# Patient Record
Sex: Male | Born: 1985 | Race: White | Hispanic: No | Marital: Single | Smoking: Current some day smoker
Health system: Southern US, Community
[De-identification: ages and names within clinical notes are randomized; demographics above are authoritative.]

## PROBLEM LIST (undated history)

## (undated) DIAGNOSIS — A419 Sepsis, unspecified organism: Secondary | ICD-10-CM

## (undated) DIAGNOSIS — K3184 Gastroparesis: Secondary | ICD-10-CM

## (undated) DIAGNOSIS — G8929 Other chronic pain: Secondary | ICD-10-CM

## (undated) DIAGNOSIS — I10 Essential (primary) hypertension: Secondary | ICD-10-CM

## (undated) DIAGNOSIS — D649 Anemia, unspecified: Secondary | ICD-10-CM

## (undated) DIAGNOSIS — Z22322 Carrier or suspected carrier of Methicillin resistant Staphylococcus aureus: Secondary | ICD-10-CM

## (undated) DIAGNOSIS — N289 Disorder of kidney and ureter, unspecified: Secondary | ICD-10-CM

## (undated) DIAGNOSIS — J690 Pneumonitis due to inhalation of food and vomit: Secondary | ICD-10-CM

## (undated) DIAGNOSIS — F1120 Opioid dependence, uncomplicated: Secondary | ICD-10-CM

## (undated) DIAGNOSIS — R569 Unspecified convulsions: Secondary | ICD-10-CM

## (undated) DIAGNOSIS — G9340 Encephalopathy, unspecified: Secondary | ICD-10-CM

## (undated) DIAGNOSIS — F419 Anxiety disorder, unspecified: Secondary | ICD-10-CM

## (undated) DIAGNOSIS — Z765 Malingerer [conscious simulation]: Secondary | ICD-10-CM

## (undated) DIAGNOSIS — Z8719 Personal history of other diseases of the digestive system: Secondary | ICD-10-CM

## (undated) DIAGNOSIS — E119 Type 2 diabetes mellitus without complications: Secondary | ICD-10-CM

## (undated) HISTORY — PX: ORTHOPEDIC SURGERY: SHX850

---

## 2004-07-14 ENCOUNTER — Other Ambulatory Visit: Payer: Self-pay

## 2004-07-14 ENCOUNTER — Emergency Department: Payer: Self-pay | Admitting: Internal Medicine

## 2005-05-07 ENCOUNTER — Emergency Department: Payer: Self-pay | Admitting: Emergency Medicine

## 2005-06-03 ENCOUNTER — Inpatient Hospital Stay: Payer: Self-pay | Admitting: Internal Medicine

## 2005-06-03 ENCOUNTER — Other Ambulatory Visit: Payer: Self-pay

## 2005-09-19 ENCOUNTER — Ambulatory Visit: Payer: Self-pay | Admitting: Internal Medicine

## 2005-10-14 ENCOUNTER — Emergency Department: Payer: Self-pay | Admitting: Emergency Medicine

## 2005-10-17 ENCOUNTER — Ambulatory Visit: Payer: Self-pay | Admitting: Internal Medicine

## 2005-12-19 ENCOUNTER — Emergency Department: Payer: Self-pay | Admitting: Unknown Physician Specialty

## 2006-03-26 ENCOUNTER — Ambulatory Visit: Payer: Self-pay | Admitting: Gastroenterology

## 2006-04-09 ENCOUNTER — Ambulatory Visit: Payer: Self-pay | Admitting: Gastroenterology

## 2006-11-16 ENCOUNTER — Emergency Department: Payer: Self-pay | Admitting: Emergency Medicine

## 2007-08-12 ENCOUNTER — Emergency Department: Payer: Self-pay | Admitting: Emergency Medicine

## 2007-08-23 ENCOUNTER — Other Ambulatory Visit: Payer: Self-pay

## 2007-08-23 ENCOUNTER — Emergency Department: Payer: Self-pay | Admitting: Emergency Medicine

## 2007-11-04 ENCOUNTER — Ambulatory Visit: Payer: Self-pay | Admitting: Family Medicine

## 2008-07-13 ENCOUNTER — Emergency Department: Payer: Self-pay | Admitting: Emergency Medicine

## 2009-05-14 ENCOUNTER — Inpatient Hospital Stay: Payer: Self-pay | Admitting: Internal Medicine

## 2009-05-18 ENCOUNTER — Emergency Department: Payer: Self-pay | Admitting: Emergency Medicine

## 2009-06-06 ENCOUNTER — Ambulatory Visit: Payer: Self-pay | Admitting: Gastroenterology

## 2009-06-21 ENCOUNTER — Emergency Department: Payer: Self-pay | Admitting: Emergency Medicine

## 2009-07-17 ENCOUNTER — Emergency Department: Payer: Self-pay | Admitting: Emergency Medicine

## 2010-02-09 ENCOUNTER — Emergency Department: Payer: Self-pay | Admitting: Emergency Medicine

## 2010-03-28 ENCOUNTER — Inpatient Hospital Stay: Payer: Self-pay | Admitting: *Deleted

## 2010-05-15 ENCOUNTER — Observation Stay: Payer: Self-pay | Admitting: Internal Medicine

## 2010-09-26 ENCOUNTER — Ambulatory Visit: Payer: Self-pay | Admitting: Gastroenterology

## 2011-01-07 ENCOUNTER — Emergency Department: Payer: Self-pay | Admitting: Emergency Medicine

## 2011-04-17 ENCOUNTER — Ambulatory Visit: Payer: Self-pay | Admitting: Urology

## 2011-04-25 ENCOUNTER — Ambulatory Visit: Payer: Self-pay | Admitting: Urology

## 2011-05-09 ENCOUNTER — Ambulatory Visit: Payer: Self-pay | Admitting: Urology

## 2011-05-13 ENCOUNTER — Ambulatory Visit: Payer: Self-pay | Admitting: Urology

## 2011-05-16 ENCOUNTER — Ambulatory Visit: Payer: Self-pay | Admitting: Urology

## 2011-05-22 ENCOUNTER — Ambulatory Visit: Payer: Self-pay | Admitting: Urology

## 2011-05-23 ENCOUNTER — Ambulatory Visit: Payer: Self-pay | Admitting: Urology

## 2011-05-27 ENCOUNTER — Emergency Department: Payer: Self-pay | Admitting: Emergency Medicine

## 2011-06-06 ENCOUNTER — Ambulatory Visit: Payer: Self-pay | Admitting: Urology

## 2011-06-18 ENCOUNTER — Ambulatory Visit: Payer: Self-pay | Admitting: Urology

## 2011-06-27 ENCOUNTER — Ambulatory Visit: Payer: Self-pay | Admitting: Urology

## 2011-07-11 ENCOUNTER — Ambulatory Visit: Payer: Self-pay | Admitting: Urology

## 2011-07-25 ENCOUNTER — Ambulatory Visit: Payer: Self-pay | Admitting: Urology

## 2011-08-15 ENCOUNTER — Ambulatory Visit: Payer: Self-pay | Admitting: Urology

## 2011-08-29 ENCOUNTER — Ambulatory Visit: Payer: Self-pay | Admitting: Urology

## 2011-09-11 ENCOUNTER — Ambulatory Visit: Payer: Self-pay | Admitting: Urology

## 2011-09-25 ENCOUNTER — Ambulatory Visit: Payer: Self-pay | Admitting: Urology

## 2011-10-03 ENCOUNTER — Ambulatory Visit: Payer: Self-pay | Admitting: Urology

## 2011-10-17 ENCOUNTER — Ambulatory Visit: Payer: Self-pay | Admitting: Urology

## 2011-10-28 ENCOUNTER — Inpatient Hospital Stay: Payer: Self-pay | Admitting: Internal Medicine

## 2011-10-28 LAB — COMPREHENSIVE METABOLIC PANEL
BUN: 34 mg/dL — ABNORMAL HIGH (ref 7–18)
Bilirubin,Total: 0.6 mg/dL (ref 0.2–1.0)
Chloride: 94 mmol/L — ABNORMAL LOW (ref 98–107)
Co2: 31 mmol/L (ref 21–32)
Creatinine: 1.03 mg/dL (ref 0.60–1.30)
EGFR (African American): >60
Osmolality: 286 (ref 275–301)
Potassium: 4.8 mmol/L (ref 3.5–5.1)

## 2011-10-28 LAB — CBC
HCT: 40.7 % (ref 40.0–52.0)
MCHC: 32.4 g/dL (ref 32.0–36.0)
RDW: 16.5 % — ABNORMAL HIGH (ref 11.5–14.5)

## 2011-10-28 LAB — URINALYSIS, COMPLETE
Bilirubin,UR: NEGATIVE
Glucose,UR: 50 mg/dL (ref 0–75)
Nitrite: NEGATIVE
Protein: >=500
Specific Gravity: 1.02 (ref 1.003–1.030)

## 2011-10-29 LAB — BUN: BUN: 22 mg/dL — ABNORMAL HIGH (ref 7–18)

## 2011-10-29 LAB — CBC WITH DIFFERENTIAL/PLATELET
Basophil #: 0 10*3/uL (ref 0.0–0.1)
Eosinophil #: 0 10*3/uL (ref 0.0–0.7)
HGB: 12.3 g/dL — ABNORMAL LOW (ref 13.0–18.0)
Lymphocyte #: 1.8 10*3/uL (ref 1.0–3.6)
Lymphocyte %: 11.6 %
MCH: 29.8 pg (ref 26.0–34.0)
MCHC: 32.5 g/dL (ref 32.0–36.0)
Neutrophil #: 13.1 10*3/uL — ABNORMAL HIGH (ref 1.4–6.5)
Neutrophil %: 82.9 %
Platelet: 427 10*3/uL (ref 150–440)
RBC: 4.13 10*6/uL — ABNORMAL LOW (ref 4.40–5.90)
WBC: 15.8 10*3/uL — ABNORMAL HIGH (ref 3.8–10.6)

## 2011-10-29 LAB — HEMOGLOBIN A1C: Hemoglobin A1C: 8.4 % — ABNORMAL HIGH (ref 4.2–6.3)

## 2011-10-31 ENCOUNTER — Ambulatory Visit: Payer: Self-pay | Admitting: Urology

## 2011-11-03 LAB — URINE CULTURE

## 2011-11-05 ENCOUNTER — Ambulatory Visit: Payer: Self-pay | Admitting: Unknown Physician Specialty

## 2012-03-31 IMAGING — CR DG ABDOMEN 1V
1 series · 1 of 1 positions shown · non-contrast
Comparison: none

REASON FOR EXAM: kidney stones
COMMENTS:

[view not recorded]
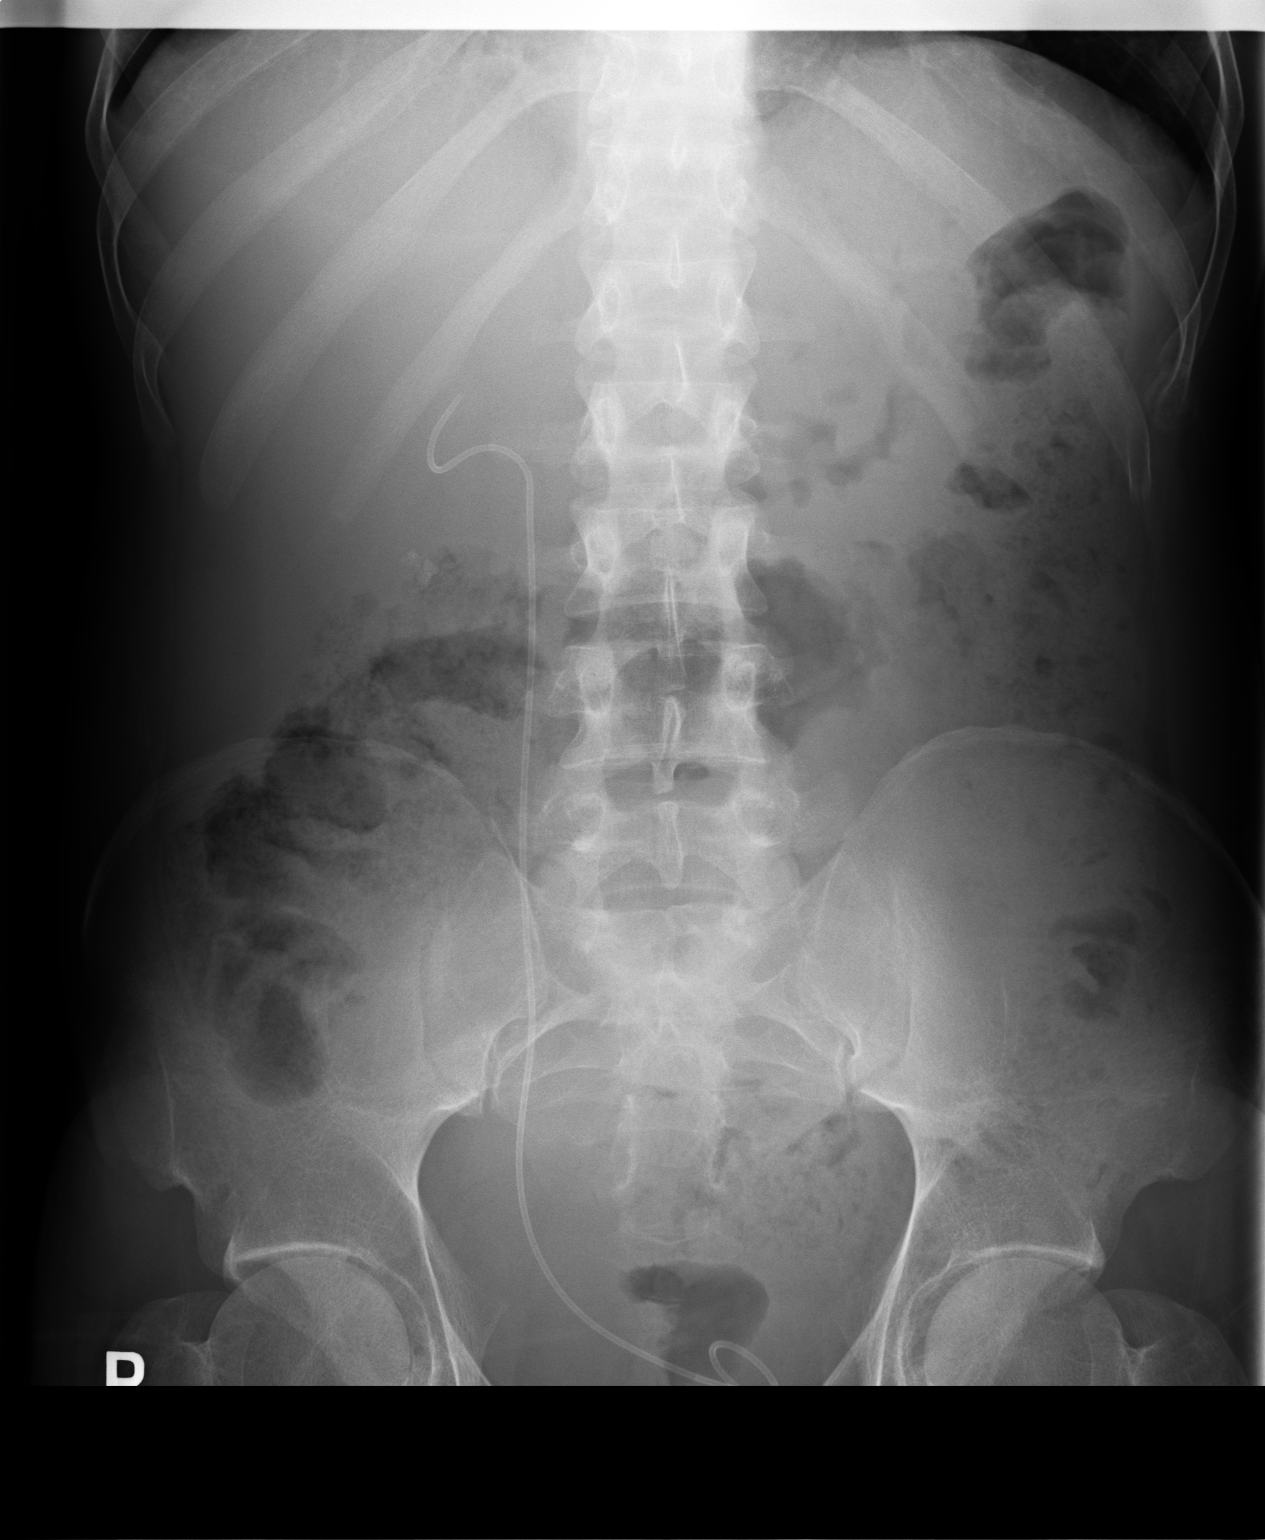

[1 of 1 positions shown; findings below may reference images not displayed]

PROCEDURE:     MDR - MDR KIDNEY URETER BLADDER  - June 18, 2011  [DATE]

RESULT:     An AP view of the abdomen is compared to a prior exam of
06/06/2011. A right ureteral stent is again observed. Calcifications are
visualized at the lower pole of the right kidney. The previously noted faint
calcifications along the upper course of the right ureteral stent are less
evident but there are noted 1 or 2 faint densities that may represent
residual stones. The findings now seen also could be artifactual. No left
renal stones or left ureteral stones are seen.
IMPRESSION: 1. Right nephrolithiasis.
2. There are faint equivocal densities projected along the course of the
right ureteral stent. The possibility of faint residual stones in the right
ureter cannot be entirely excluded but the findings are not definite.
3. No left renal or left ureteral stones are seen.

## 2012-05-08 ENCOUNTER — Ambulatory Visit: Payer: Self-pay | Admitting: Urology

## 2012-10-17 LAB — CBC WITH DIFFERENTIAL/PLATELET
Basophil %: 1.3 %
Eosinophil #: 0.3 10*3/uL (ref 0.0–0.7)
Lymphocyte %: 9.8 %
MCV: 89 fL (ref 80–100)
Monocyte #: 1.2 x10 3/mm — ABNORMAL HIGH (ref 0.2–1.0)
Monocyte %: 5.3 %
Neutrophil #: 17.9 10*3/uL — ABNORMAL HIGH (ref 1.4–6.5)
Neutrophil %: 82.4 %
RBC: 4.63 10*6/uL (ref 4.40–5.90)
WBC: 21.7 10*3/uL — ABNORMAL HIGH (ref 3.8–10.6)

## 2012-10-17 LAB — COMPREHENSIVE METABOLIC PANEL
Albumin: 3.5 g/dL (ref 3.4–5.0)
BUN: 57 mg/dL — ABNORMAL HIGH (ref 7–18)
Chloride: 90 mmol/L — ABNORMAL LOW (ref 98–107)
Co2: 31 mmol/L (ref 21–32)
Glucose: 285 mg/dL — ABNORMAL HIGH (ref 65–99)
SGOT(AST): 17 U/L (ref 15–37)
SGPT (ALT): 20 U/L (ref 12–78)
Sodium: 132 mmol/L — ABNORMAL LOW (ref 136–145)
Total Protein: 8.2 g/dL (ref 6.4–8.2)

## 2012-10-18 ENCOUNTER — Inpatient Hospital Stay: Payer: Self-pay | Admitting: Internal Medicine

## 2012-10-18 ENCOUNTER — Ambulatory Visit: Payer: Self-pay | Admitting: Neurology

## 2012-10-18 LAB — CBC WITH DIFFERENTIAL/PLATELET
Basophil %: 0.3 %
Eosinophil #: 0.1 10*3/uL (ref 0.0–0.7)
Eosinophil %: 0.7 %
HCT: 36.7 % — ABNORMAL LOW (ref 40.0–52.0)
HGB: 11.5 g/dL — ABNORMAL LOW (ref 13.0–18.0)
Lymphocyte %: 11.3 %
MCHC: 31.2 g/dL — ABNORMAL LOW (ref 32.0–36.0)
MCV: 89 fL (ref 80–100)
Monocyte #: 1.1 x10 3/mm — ABNORMAL HIGH (ref 0.2–1.0)
Neutrophil #: 11.9 10*3/uL — ABNORMAL HIGH (ref 1.4–6.5)
Neutrophil %: 80.2 %
Platelet: 298 10*3/uL (ref 150–440)
WBC: 14.9 10*3/uL — ABNORMAL HIGH (ref 3.8–10.6)

## 2012-10-18 LAB — DRUG SCREEN, URINE
Barbiturates, Ur Screen: NEGATIVE (ref ?–200)
Cannabinoid 50 Ng, Ur ~~LOC~~: POSITIVE (ref ?–50)
MDMA (Ecstasy)Ur Screen: NEGATIVE (ref ?–500)
Phencyclidine (PCP) Ur S: NEGATIVE (ref ?–25)

## 2012-10-18 LAB — PROTIME-INR
INR: 1
Prothrombin Time: 13.2 secs (ref 11.5–14.7)

## 2012-10-18 LAB — OSMOLALITY, URINE: Osmolality: 469 mOsm/kg

## 2012-10-18 LAB — URINALYSIS, COMPLETE
Bacteria: NONE SEEN
Bilirubin,UR: NEGATIVE
Granular Cast: 13
Hyaline Cast: 29
Leukocyte Esterase: NEGATIVE
Nitrite: NEGATIVE
Ph: 5 (ref 4.5–8.0)
Specific Gravity: 1.016 (ref 1.003–1.030)
WBC UR: 7 /HPF (ref 0–5)

## 2012-10-18 LAB — BASIC METABOLIC PANEL
Anion Gap: 8 (ref 7–16)
Calcium, Total: 8.1 mg/dL — ABNORMAL LOW (ref 8.5–10.1)
Chloride: 99 mmol/L (ref 98–107)
Co2: 30 mmol/L (ref 21–32)
Creatinine: 1.81 mg/dL — ABNORMAL HIGH (ref 0.60–1.30)
EGFR (African American): 58 — ABNORMAL LOW
Osmolality: 292 (ref 275–301)
Sodium: 137 mmol/L (ref 136–145)

## 2012-10-18 LAB — CK: CK, Total: 96 U/L (ref 35–232)

## 2012-10-18 LAB — APTT: Activated PTT: 37 secs — ABNORMAL HIGH (ref 23.6–35.9)

## 2012-10-18 LAB — CREATININE, URINE, RANDOM: Creatinine, Urine Random: 102.4 mg/dL (ref 30.0–125.0)

## 2012-10-18 LAB — SODIUM, URINE, RANDOM: Sodium, Urine Random: 50 mmol/L (ref 20–110)

## 2012-10-19 LAB — CBC WITH DIFFERENTIAL/PLATELET
Basophil #: 0.1 10*3/uL (ref 0.0–0.1)
Basophil %: 0.7 %
Eosinophil #: 0.1 10*3/uL (ref 0.0–0.7)
HCT: 36.5 % — ABNORMAL LOW (ref 40.0–52.0)
Lymphocyte #: 2.4 10*3/uL (ref 1.0–3.6)
Lymphocyte %: 15 %
MCV: 90 fL (ref 80–100)
Neutrophil #: 12.3 10*3/uL — ABNORMAL HIGH (ref 1.4–6.5)
Neutrophil %: 77.6 %
RDW: 16.4 % — ABNORMAL HIGH (ref 11.5–14.5)
WBC: 15.8 10*3/uL — ABNORMAL HIGH (ref 3.8–10.6)

## 2012-10-19 LAB — LIPID PANEL
Cholesterol: 153 mg/dL (ref 0–200)
HDL Cholesterol: 35 mg/dL — ABNORMAL LOW (ref 40–60)
Ldl Cholesterol, Calc: 46 mg/dL (ref 0–100)
Triglycerides: 358 mg/dL — ABNORMAL HIGH (ref 0–200)

## 2012-10-19 LAB — PROTEIN / CREATININE RATIO, URINE: Protein, Random Urine: 182 mg/dL — ABNORMAL HIGH (ref 0–12)

## 2012-10-19 LAB — COMPREHENSIVE METABOLIC PANEL
Alkaline Phosphatase: 115 U/L (ref 50–136)
BUN: 16 mg/dL (ref 7–18)
Calcium, Total: 8.3 mg/dL — ABNORMAL LOW (ref 8.5–10.1)
Creatinine: 0.84 mg/dL (ref 0.60–1.30)
Osmolality: 284 (ref 275–301)
SGOT(AST): 15 U/L (ref 15–37)
SGPT (ALT): 15 U/L (ref 12–78)

## 2012-10-19 LAB — CLOSTRIDIUM DIFFICILE BY PCR

## 2012-12-10 ENCOUNTER — Inpatient Hospital Stay: Payer: Self-pay | Admitting: Internal Medicine

## 2012-12-10 LAB — CBC WITH DIFFERENTIAL/PLATELET
Basophil %: 0.4 %
Eosinophil %: 1.8 %
HGB: 14 g/dL (ref 13.0–18.0)
Lymphocyte #: 4.1 10*3/uL — ABNORMAL HIGH (ref 1.0–3.6)
Lymphocyte %: 27.5 %
MCHC: 32.4 g/dL (ref 32.0–36.0)
MCV: 88 fL (ref 80–100)
Monocyte #: 1.3 x10 3/mm — ABNORMAL HIGH (ref 0.2–1.0)
Monocyte %: 8.8 %
RBC: 4.92 10*6/uL (ref 4.40–5.90)
RDW: 16.2 % — ABNORMAL HIGH (ref 11.5–14.5)
WBC: 15 10*3/uL — ABNORMAL HIGH (ref 3.8–10.6)

## 2012-12-10 LAB — BASIC METABOLIC PANEL
Chloride: 93 mmol/L — ABNORMAL LOW (ref 98–107)
Creatinine: 4.92 mg/dL — ABNORMAL HIGH (ref 0.60–1.30)
EGFR (Non-African Amer.): 15 — ABNORMAL LOW
Osmolality: 293 (ref 275–301)
Potassium: 3.8 mmol/L (ref 3.5–5.1)
Sodium: 131 mmol/L — ABNORMAL LOW (ref 136–145)

## 2012-12-10 LAB — URINALYSIS, COMPLETE
Bilirubin,UR: NEGATIVE
Glucose,UR: >=500 mg/dL (ref 0–75)
Ph: 6 (ref 4.5–8.0)
Protein: 100
RBC,UR: 24 /HPF (ref 0–5)
Specific Gravity: 1.007 (ref 1.003–1.030)
WBC UR: 834 /HPF (ref 0–5)

## 2012-12-10 LAB — COMPREHENSIVE METABOLIC PANEL
Alkaline Phosphatase: 134 U/L (ref 50–136)
Anion Gap: 10 (ref 7–16)
BUN: 92 mg/dL — ABNORMAL HIGH (ref 7–18)
Bilirubin,Total: 0.3 mg/dL (ref 0.2–1.0)
Chloride: 83 mmol/L — ABNORMAL LOW (ref 98–107)
Co2: 35 mmol/L — ABNORMAL HIGH (ref 21–32)
Creatinine: 5.71 mg/dL — ABNORMAL HIGH (ref 0.60–1.30)
EGFR (African American): 14 — ABNORMAL LOW
EGFR (Non-African Amer.): 12 — ABNORMAL LOW
Osmolality: 295 (ref 275–301)
Potassium: 3.9 mmol/L (ref 3.5–5.1)
SGOT(AST): 14 U/L — ABNORMAL LOW (ref 15–37)
SGPT (ALT): 12 U/L (ref 12–78)

## 2012-12-10 LAB — DRUG SCREEN, URINE
Amphetamines, Ur Screen: NEGATIVE (ref ?–1000)
Benzodiazepine, Ur Scrn: POSITIVE (ref ?–200)
Cannabinoid 50 Ng, Ur ~~LOC~~: POSITIVE (ref ?–50)
MDMA (Ecstasy)Ur Screen: NEGATIVE (ref ?–500)
Methadone, Ur Screen: NEGATIVE (ref ?–300)
Opiate, Ur Screen: POSITIVE (ref ?–300)
Phencyclidine (PCP) Ur S: NEGATIVE (ref ?–25)

## 2012-12-11 LAB — URINALYSIS, COMPLETE
Blood: NEGATIVE
Ketone: NEGATIVE
Leukocyte Esterase: NEGATIVE
Nitrite: NEGATIVE
Ph: 5 (ref 4.5–8.0)
RBC,UR: <1 /HPF (ref 0–5)
Specific Gravity: 1.008 (ref 1.003–1.030)

## 2012-12-11 LAB — CBC WITH DIFFERENTIAL/PLATELET
Basophil #: 0.1 10*3/uL (ref 0.0–0.1)
Basophil %: 0.5 %
Eosinophil #: 0.3 10*3/uL (ref 0.0–0.7)
HCT: 34.9 % — ABNORMAL LOW (ref 40.0–52.0)
Lymphocyte %: 19.2 %
MCHC: 31.9 g/dL — ABNORMAL LOW (ref 32.0–36.0)
MCV: 88 fL (ref 80–100)
Neutrophil #: 7.4 10*3/uL — ABNORMAL HIGH (ref 1.4–6.5)
Platelet: 285 10*3/uL (ref 150–440)
RBC: 3.95 10*6/uL — ABNORMAL LOW (ref 4.40–5.90)
RDW: 16 % — ABNORMAL HIGH (ref 11.5–14.5)

## 2012-12-11 LAB — COMPREHENSIVE METABOLIC PANEL
Albumin: 2.5 g/dL — ABNORMAL LOW (ref 3.4–5.0)
BUN: 71 mg/dL — ABNORMAL HIGH (ref 7–18)
Creatinine: 3.57 mg/dL — ABNORMAL HIGH (ref 0.60–1.30)
EGFR (African American): 26 — ABNORMAL LOW
EGFR (Non-African Amer.): 22 — ABNORMAL LOW
Glucose: 477 mg/dL — ABNORMAL HIGH (ref 65–99)
Osmolality: 305 (ref 275–301)
Potassium: 4.1 mmol/L (ref 3.5–5.1)
SGPT (ALT): 12 U/L (ref 12–78)
Sodium: 131 mmol/L — ABNORMAL LOW (ref 136–145)
Total Protein: 6.4 g/dL (ref 6.4–8.2)

## 2012-12-11 LAB — PROTEIN / CREATININE RATIO, URINE
Creatinine, Urine: 44.9 mg/dL (ref 30.0–125.0)
Protein/Creat. Ratio: 2472 mg/gCREAT — ABNORMAL HIGH (ref 0–200)

## 2012-12-12 LAB — BASIC METABOLIC PANEL
Anion Gap: 9 (ref 7–16)
BUN: 45 mg/dL — ABNORMAL HIGH (ref 7–18)
Co2: 24 mmol/L (ref 21–32)
Creatinine: 1.99 mg/dL — ABNORMAL HIGH (ref 0.60–1.30)
EGFR (African American): 52 — ABNORMAL LOW
EGFR (Non-African Amer.): 45 — ABNORMAL LOW
Glucose: 485 mg/dL — ABNORMAL HIGH (ref 65–99)
Osmolality: 294 (ref 275–301)
Sodium: 130 mmol/L — ABNORMAL LOW (ref 136–145)

## 2012-12-12 LAB — CBC WITH DIFFERENTIAL/PLATELET
Basophil %: 0.7 %
Eosinophil #: 0.1 10*3/uL (ref 0.0–0.7)
Eosinophil %: 1.1 %
HGB: 11.9 g/dL — ABNORMAL LOW (ref 13.0–18.0)
Lymphocyte #: 2 10*3/uL (ref 1.0–3.6)
MCHC: 31.8 g/dL — ABNORMAL LOW (ref 32.0–36.0)
Monocyte #: 0.7 x10 3/mm (ref 0.2–1.0)
Monocyte %: 5.2 %
Neutrophil #: 9.7 10*3/uL — ABNORMAL HIGH (ref 1.4–6.5)
Neutrophil %: 77.2 %
RDW: 15.6 % — ABNORMAL HIGH (ref 11.5–14.5)

## 2012-12-12 LAB — DRUG SCREEN, URINE
Amphetamines, Ur Screen: NEGATIVE (ref ?–1000)
Barbiturates, Ur Screen: NEGATIVE (ref ?–200)
Benzodiazepine, Ur Scrn: POSITIVE (ref ?–200)
Cannabinoid 50 Ng, Ur ~~LOC~~: NEGATIVE (ref ?–50)
Methadone, Ur Screen: NEGATIVE (ref ?–300)
Opiate, Ur Screen: NEGATIVE (ref ?–300)
Tricyclic, Ur Screen: NEGATIVE (ref ?–1000)

## 2012-12-14 LAB — STOOL CULTURE

## 2013-05-13 ENCOUNTER — Encounter: Payer: Self-pay | Admitting: Orthopedic Surgery

## 2013-05-17 ENCOUNTER — Encounter: Payer: Self-pay | Admitting: Orthopedic Surgery

## 2013-09-21 ENCOUNTER — Inpatient Hospital Stay: Payer: Self-pay | Admitting: Internal Medicine

## 2013-09-21 LAB — CBC
HCT: 46.2 % (ref 40.0–52.0)
HGB: 12.8 g/dL — ABNORMAL LOW (ref 13.0–18.0)
MCH: 27.7 pg (ref 26.0–34.0)
MCHC: 27.6 g/dL — ABNORMAL LOW (ref 32.0–36.0)
MCV: 100 fL (ref 80–100)
PLATELETS: 491 10*3/uL — AB (ref 150–440)
RBC: 4.6 10*6/uL (ref 4.40–5.90)
RDW: 17.6 % — ABNORMAL HIGH (ref 11.5–14.5)
WBC: 20.3 10*3/uL — ABNORMAL HIGH (ref 3.8–10.6)

## 2013-09-21 LAB — BASIC METABOLIC PANEL
ANION GAP: 24 — AB (ref 7–16)
ANION GAP: 15 (ref 7–16)
BUN: 67 mg/dL — ABNORMAL HIGH (ref 7–18)
BUN: 66 mg/dL — ABNORMAL HIGH (ref 7–18)
CHLORIDE: 92 mmol/L — AB (ref 98–107)
CHLORIDE: 96 mmol/L — AB (ref 98–107)
CO2: 10 mmol/L — AB (ref 21–32)
Calcium, Total: 7.2 mg/dL — ABNORMAL LOW (ref 8.5–10.1)
Calcium, Total: 7.3 mg/dL — ABNORMAL LOW (ref 8.5–10.1)
Co2: 14 mmol/L — ABNORMAL LOW (ref 21–32)
Creatinine: 2.05 mg/dL — ABNORMAL HIGH (ref 0.60–1.30)
Creatinine: 2.06 mg/dL — ABNORMAL HIGH (ref 0.60–1.30)
EGFR (African American): 50 — ABNORMAL LOW
EGFR (African American): 49 — ABNORMAL LOW
EGFR (Non-African Amer.): 43 — ABNORMAL LOW
EGFR (Non-African Amer.): 43 — ABNORMAL LOW
GLUCOSE: 810 mg/dL — AB (ref 65–99)
Glucose: 946 mg/dL (ref 65–99)
OSMOLALITY: 320 (ref 275–301)
Osmolality: 310 (ref 275–301)
POTASSIUM: 4 mmol/L (ref 3.5–5.1)
Potassium: 3.6 mmol/L (ref 3.5–5.1)
SODIUM: 126 mmol/L — AB (ref 136–145)
SODIUM: 125 mmol/L — AB (ref 136–145)

## 2013-09-21 LAB — COMPREHENSIVE METABOLIC PANEL
ALK PHOS: 244 U/L — AB
AST: 10 U/L — AB (ref 15–37)
Albumin: 2.4 g/dL — ABNORMAL LOW (ref 3.4–5.0)
Anion Gap: 33 — ABNORMAL HIGH (ref 7–16)
BUN: 70 mg/dL — ABNORMAL HIGH (ref 7–18)
Bilirubin,Total: 0.7 mg/dL (ref 0.2–1.0)
CALCIUM: 8.1 mg/dL — AB (ref 8.5–10.1)
CO2: 7 mmol/L — AB (ref 21–32)
Chloride: 75 mmol/L — ABNORMAL LOW (ref 98–107)
Creatinine: 1.96 mg/dL — ABNORMAL HIGH (ref 0.60–1.30)
GFR CALC AF AMER: 52 — AB
GFR CALC NON AF AMER: 45 — AB
GLUCOSE: 1196 mg/dL — AB (ref 65–99)
OSMOLALITY: 314 (ref 275–301)
Potassium: 5.3 mmol/L — ABNORMAL HIGH (ref 3.5–5.1)
SGPT (ALT): 24 U/L (ref 12–78)
SODIUM: 115 mmol/L — AB (ref 136–145)
Total Protein: 6.8 g/dL (ref 6.4–8.2)

## 2013-09-21 LAB — RAPID INFLUENZA A&B ANTIGENS (ARMC ONLY)

## 2013-09-21 LAB — RAPID HIV-1/2 QL/CONFIRM: HIV-1/2,Rapid Ql: NEGATIVE

## 2013-09-22 LAB — BASIC METABOLIC PANEL
ANION GAP: 8 (ref 7–16)
Anion Gap: 10 (ref 7–16)
Anion Gap: 9 (ref 7–16)
BUN: 64 mg/dL — ABNORMAL HIGH (ref 7–18)
BUN: 56 mg/dL — ABNORMAL HIGH (ref 7–18)
BUN: 53 mg/dL — ABNORMAL HIGH (ref 7–18)
CALCIUM: 8.6 mg/dL (ref 8.5–10.1)
CHLORIDE: 103 mmol/L (ref 98–107)
CREATININE: 1.76 mg/dL — AB (ref 0.60–1.30)
Calcium, Total: 7.6 mg/dL — ABNORMAL LOW (ref 8.5–10.1)
Calcium, Total: 8.1 mg/dL — ABNORMAL LOW (ref 8.5–10.1)
Chloride: 110 mmol/L — ABNORMAL HIGH (ref 98–107)
Chloride: 106 mmol/L (ref 98–107)
Co2: 19 mmol/L — ABNORMAL LOW (ref 21–32)
Co2: 20 mmol/L — ABNORMAL LOW (ref 21–32)
Co2: 20 mmol/L — ABNORMAL LOW (ref 21–32)
Creatinine: 1.99 mg/dL — ABNORMAL HIGH (ref 0.60–1.30)
Creatinine: 1.82 mg/dL — ABNORMAL HIGH (ref 0.60–1.30)
EGFR (African American): 51 — ABNORMAL LOW
EGFR (African American): 57 — ABNORMAL LOW
EGFR (African American): 60 — ABNORMAL LOW
EGFR (Non-African Amer.): 44 — ABNORMAL LOW
EGFR (Non-African Amer.): 49 — ABNORMAL LOW
EGFR (Non-African Amer.): 51 — ABNORMAL LOW
Glucose: 548 mg/dL (ref 65–99)
Glucose: 106 mg/dL — ABNORMAL HIGH (ref 65–99)
Glucose: 90 mg/dL (ref 65–99)
OSMOLALITY: 293 (ref 275–301)
OSMOLALITY: 282 (ref 275–301)
Osmolality: 308 (ref 275–301)
POTASSIUM: 3.2 mmol/L — AB (ref 3.5–5.1)
POTASSIUM: 3.2 mmol/L — AB (ref 3.5–5.1)
Potassium: 3.1 mmol/L — ABNORMAL LOW (ref 3.5–5.1)
Sodium: 132 mmol/L — ABNORMAL LOW (ref 136–145)
Sodium: 139 mmol/L (ref 136–145)
Sodium: 134 mmol/L — ABNORMAL LOW (ref 136–145)

## 2013-09-22 LAB — COMPREHENSIVE METABOLIC PANEL
ALBUMIN: 1.7 g/dL — AB (ref 3.4–5.0)
ALK PHOS: 161 U/L — AB
Anion Gap: 9 (ref 7–16)
BUN: 60 mg/dL — ABNORMAL HIGH (ref 7–18)
Bilirubin,Total: 0.4 mg/dL (ref 0.2–1.0)
CHLORIDE: 108 mmol/L — AB (ref 98–107)
CREATININE: 2.06 mg/dL — AB (ref 0.60–1.30)
Calcium, Total: 8.1 mg/dL — ABNORMAL LOW (ref 8.5–10.1)
Co2: 20 mmol/L — ABNORMAL LOW (ref 21–32)
EGFR (African American): 49 — ABNORMAL LOW
EGFR (Non-African Amer.): 43 — ABNORMAL LOW
GLUCOSE: 258 mg/dL — AB (ref 65–99)
OSMOLALITY: 300 (ref 275–301)
POTASSIUM: 3 mmol/L — AB (ref 3.5–5.1)
SGOT(AST): 10 U/L — ABNORMAL LOW (ref 15–37)
SGPT (ALT): 17 U/L (ref 12–78)
Sodium: 137 mmol/L (ref 136–145)
TOTAL PROTEIN: 4.9 g/dL — AB (ref 6.4–8.2)

## 2013-09-22 LAB — CBC WITH DIFFERENTIAL/PLATELET
Basophil #: 0 10*3/uL (ref 0.0–0.1)
Basophil %: 0.2 %
EOS ABS: 0 10*3/uL (ref 0.0–0.7)
Eosinophil %: 0.2 %
HCT: 32.5 % — AB (ref 40.0–52.0)
HGB: 10.9 g/dL — AB (ref 13.0–18.0)
LYMPHS ABS: 1.2 10*3/uL (ref 1.0–3.6)
LYMPHS PCT: 18.7 %
MCH: 28.4 pg (ref 26.0–34.0)
MCHC: 33.6 g/dL (ref 32.0–36.0)
MCV: 85 fL (ref 80–100)
Monocyte #: 0.3 x10 3/mm (ref 0.2–1.0)
Monocyte %: 4.1 %
Neutrophil #: 4.9 10*3/uL (ref 1.4–6.5)
Neutrophil %: 76.8 %
PLATELETS: 372 10*3/uL (ref 150–440)
RBC: 3.85 10*6/uL — ABNORMAL LOW (ref 4.40–5.90)
RDW: 15.9 % — AB (ref 11.5–14.5)
WBC: 6.4 10*3/uL (ref 3.8–10.6)

## 2013-09-22 LAB — DRUG SCREEN, URINE
Amphetamines, Ur Screen: NEGATIVE (ref ?–1000)
BENZODIAZEPINE, UR SCRN: POSITIVE (ref ?–200)
Barbiturates, Ur Screen: NEGATIVE (ref ?–200)
Cannabinoid 50 Ng, Ur ~~LOC~~: POSITIVE (ref ?–50)
Cocaine Metabolite,Ur ~~LOC~~: NEGATIVE (ref ?–300)
MDMA (ECSTASY) UR SCREEN: NEGATIVE (ref ?–500)
METHADONE, UR SCREEN: NEGATIVE (ref ?–300)
Opiate, Ur Screen: NEGATIVE (ref ?–300)
PHENCYCLIDINE (PCP) UR S: NEGATIVE (ref ?–25)
Tricyclic, Ur Screen: NEGATIVE (ref ?–1000)

## 2013-09-22 LAB — HEMOGLOBIN: HGB: 11 g/dL — AB (ref 13.0–18.0)

## 2013-09-22 LAB — MAGNESIUM
MAGNESIUM: 1.5 mg/dL — AB
MAGNESIUM: 1.6 mg/dL — AB

## 2013-09-23 LAB — BASIC METABOLIC PANEL
ANION GAP: 7 (ref 7–16)
BUN: 42 mg/dL — ABNORMAL HIGH (ref 7–18)
CHLORIDE: 110 mmol/L — AB (ref 98–107)
Calcium, Total: 7.6 mg/dL — ABNORMAL LOW (ref 8.5–10.1)
Co2: 22 mmol/L (ref 21–32)
Creatinine: 1.69 mg/dL — ABNORMAL HIGH (ref 0.60–1.30)
EGFR (Non-African Amer.): 54 — ABNORMAL LOW
GLUCOSE: 170 mg/dL — AB (ref 65–99)
Osmolality: 292 (ref 275–301)
POTASSIUM: 3.7 mmol/L (ref 3.5–5.1)
SODIUM: 139 mmol/L (ref 136–145)

## 2013-09-23 LAB — URINE CULTURE

## 2013-09-23 LAB — CBC WITH DIFFERENTIAL/PLATELET
BASOS PCT: 0.1 %
Basophil #: 0 10*3/uL (ref 0.0–0.1)
Eosinophil #: 0.6 10*3/uL (ref 0.0–0.7)
Eosinophil %: 2.3 %
HCT: 29.6 % — ABNORMAL LOW (ref 40.0–52.0)
HGB: 9.9 g/dL — ABNORMAL LOW (ref 13.0–18.0)
Lymphocyte #: 2 10*3/uL (ref 1.0–3.6)
Lymphocyte %: 7.8 %
MCH: 28.4 pg (ref 26.0–34.0)
MCHC: 33.5 g/dL (ref 32.0–36.0)
MCV: 85 fL (ref 80–100)
MONO ABS: 0.4 x10 3/mm (ref 0.2–1.0)
Monocyte %: 1.5 %
Neutrophil #: 22.4 10*3/uL — ABNORMAL HIGH (ref 1.4–6.5)
Neutrophil %: 88.3 %
Platelet: 317 10*3/uL (ref 150–440)
RBC: 3.49 10*6/uL — AB (ref 4.40–5.90)
RDW: 15.9 % — AB (ref 11.5–14.5)
WBC: 25.4 10*3/uL — AB (ref 3.8–10.6)

## 2013-09-23 LAB — MAGNESIUM: MAGNESIUM: 1.7 mg/dL — AB

## 2013-09-24 LAB — BASIC METABOLIC PANEL
ANION GAP: 6 — AB (ref 7–16)
ANION GAP: 8 (ref 7–16)
BUN: 24 mg/dL — ABNORMAL HIGH (ref 7–18)
BUN: 22 mg/dL — ABNORMAL HIGH (ref 7–18)
CALCIUM: 7.6 mg/dL — AB (ref 8.5–10.1)
CALCIUM: 8.2 mg/dL — AB (ref 8.5–10.1)
Chloride: 118 mmol/L — ABNORMAL HIGH (ref 98–107)
Chloride: 115 mmol/L — ABNORMAL HIGH (ref 98–107)
Co2: 22 mmol/L (ref 21–32)
Co2: 22 mmol/L (ref 21–32)
Creatinine: 1.3 mg/dL (ref 0.60–1.30)
Creatinine: 1.17 mg/dL (ref 0.60–1.30)
EGFR (African American): >60
EGFR (African American): >60
EGFR (Non-African Amer.): >60
GLUCOSE: 111 mg/dL — AB (ref 65–99)
GLUCOSE: 108 mg/dL — AB (ref 65–99)
OSMOLALITY: 293 (ref 275–301)
Osmolality: 295 (ref 275–301)
Potassium: 3.3 mmol/L — ABNORMAL LOW (ref 3.5–5.1)
Potassium: 3.9 mmol/L (ref 3.5–5.1)
SODIUM: 146 mmol/L — AB (ref 136–145)
SODIUM: 145 mmol/L (ref 136–145)

## 2013-09-24 LAB — CBC WITH DIFFERENTIAL/PLATELET
Basophil #: 0.1 10*3/uL (ref 0.0–0.1)
Basophil %: 0.5 %
Eosinophil #: 0.9 10*3/uL — ABNORMAL HIGH (ref 0.0–0.7)
Eosinophil %: 3.5 %
HCT: 28.5 % — ABNORMAL LOW (ref 40.0–52.0)
HGB: 9.2 g/dL — ABNORMAL LOW (ref 13.0–18.0)
LYMPHS ABS: 2.5 10*3/uL (ref 1.0–3.6)
Lymphocyte %: 9.8 %
MCH: 27.7 pg (ref 26.0–34.0)
MCHC: 32.4 g/dL (ref 32.0–36.0)
MCV: 86 fL (ref 80–100)
MONO ABS: 0.5 x10 3/mm (ref 0.2–1.0)
Monocyte %: 2.1 %
NEUTROS ABS: 21.2 10*3/uL — AB (ref 1.4–6.5)
Neutrophil %: 84.1 %
Platelet: 315 10*3/uL (ref 150–440)
RBC: 3.33 10*6/uL — ABNORMAL LOW (ref 4.40–5.90)
RDW: 16.3 % — ABNORMAL HIGH (ref 11.5–14.5)
WBC: 25.2 10*3/uL — ABNORMAL HIGH (ref 3.8–10.6)

## 2013-09-24 LAB — VANCOMYCIN, TROUGH: VANCOMYCIN, TROUGH: 15 ug/mL (ref 10–20)

## 2013-09-24 LAB — TRIGLYCERIDES: TRIGLYCERIDES: 309 mg/dL — AB (ref 0–200)

## 2013-09-24 LAB — CULTURE, BLOOD (SINGLE)

## 2013-09-24 LAB — MAGNESIUM
MAGNESIUM: 1.9 mg/dL
MAGNESIUM: 1.5 mg/dL — AB

## 2013-09-24 LAB — PHOSPHORUS: Phosphorus: 2.4 mg/dL — ABNORMAL LOW (ref 2.5–4.9)

## 2013-09-24 LAB — EXPECTORATED SPUTUM ASSESSMENT W REFEX TO RESP CULTURE

## 2013-09-25 LAB — CBC WITH DIFFERENTIAL/PLATELET
Basophil #: 0.3 10*3/uL — ABNORMAL HIGH (ref 0.0–0.1)
Basophil %: 1.1 %
Eosinophil #: 1.1 10*3/uL — ABNORMAL HIGH (ref 0.0–0.7)
Eosinophil %: 4 %
HCT: 25.6 % — ABNORMAL LOW (ref 40.0–52.0)
HGB: 8.4 g/dL — AB (ref 13.0–18.0)
Lymphocyte #: 2.3 10*3/uL (ref 1.0–3.6)
Lymphocyte %: 8.8 %
MCH: 28.2 pg (ref 26.0–34.0)
MCHC: 32.7 g/dL (ref 32.0–36.0)
MCV: 86 fL (ref 80–100)
MONO ABS: 0.7 x10 3/mm (ref 0.2–1.0)
Monocyte %: 2.5 %
Neutrophil #: 22.3 10*3/uL — ABNORMAL HIGH (ref 1.4–6.5)
Neutrophil %: 83.6 %
Platelet: 287 10*3/uL (ref 150–440)
RBC: 2.97 10*6/uL — AB (ref 4.40–5.90)
RDW: 16.6 % — AB (ref 11.5–14.5)
WBC: 26.6 10*3/uL — AB (ref 3.8–10.6)

## 2013-09-25 LAB — BASIC METABOLIC PANEL
ANION GAP: 5 — AB (ref 7–16)
BUN: 23 mg/dL — AB (ref 7–18)
CHLORIDE: 116 mmol/L — AB (ref 98–107)
CREATININE: 1.21 mg/dL (ref 0.60–1.30)
Calcium, Total: 7.8 mg/dL — ABNORMAL LOW (ref 8.5–10.1)
Co2: 19 mmol/L — ABNORMAL LOW (ref 21–32)
Glucose: 210 mg/dL — ABNORMAL HIGH (ref 65–99)
Osmolality: 289 (ref 275–301)
POTASSIUM: 4.4 mmol/L (ref 3.5–5.1)
SODIUM: 140 mmol/L (ref 136–145)

## 2013-09-26 LAB — COMPREHENSIVE METABOLIC PANEL
Albumin: 1.2 g/dL — ABNORMAL LOW (ref 3.4–5.0)
Alkaline Phosphatase: 823 U/L — ABNORMAL HIGH
Anion Gap: 4 — ABNORMAL LOW (ref 7–16)
BUN: 21 mg/dL — ABNORMAL HIGH (ref 7–18)
Bilirubin,Total: 0.4 mg/dL (ref 0.2–1.0)
Calcium, Total: 8.2 mg/dL — ABNORMAL LOW (ref 8.5–10.1)
Chloride: 112 mmol/L — ABNORMAL HIGH (ref 98–107)
Co2: 27 mmol/L (ref 21–32)
Creatinine: 0.98 mg/dL (ref 0.60–1.30)
EGFR (African American): >60
EGFR (Non-African Amer.): >60
Glucose: 141 mg/dL — ABNORMAL HIGH (ref 65–99)
Osmolality: 290 (ref 275–301)
Potassium: 3.4 mmol/L — ABNORMAL LOW (ref 3.5–5.1)
SGOT(AST): 32 U/L (ref 15–37)
SGPT (ALT): 30 U/L (ref 12–78)
Sodium: 143 mmol/L (ref 136–145)
Total Protein: 5.3 g/dL — ABNORMAL LOW (ref 6.4–8.2)

## 2013-09-26 LAB — CBC WITH DIFFERENTIAL/PLATELET
Basophil #: 0.1 10*3/uL (ref 0.0–0.1)
Basophil %: 0.5 %
Eosinophil #: 1 10*3/uL — ABNORMAL HIGH (ref 0.0–0.7)
Eosinophil %: 6.5 %
HCT: 28.2 % — ABNORMAL LOW (ref 40.0–52.0)
HGB: 9 g/dL — ABNORMAL LOW (ref 13.0–18.0)
Lymphocyte #: 1.6 10*3/uL (ref 1.0–3.6)
Lymphocyte %: 10.7 %
MCH: 27.1 pg (ref 26.0–34.0)
MCHC: 31.8 g/dL — ABNORMAL LOW (ref 32.0–36.0)
MCV: 85 fL (ref 80–100)
Monocyte #: 0.5 x10 3/mm (ref 0.2–1.0)
Monocyte %: 3 %
Neutrophil #: 12.1 10*3/uL — ABNORMAL HIGH (ref 1.4–6.5)
Neutrophil %: 79.3 %
Platelet: 347 10*3/uL (ref 150–440)
RBC: 3.3 10*6/uL — ABNORMAL LOW (ref 4.40–5.90)
RDW: 16.4 % — ABNORMAL HIGH (ref 11.5–14.5)
WBC: 15.3 10*3/uL — ABNORMAL HIGH (ref 3.8–10.6)

## 2013-09-26 LAB — VANCOMYCIN, TROUGH
Vancomycin, Trough: 25 ug/mL (ref 10–20)
Vancomycin, Trough: 23 ug/mL (ref 10–20)

## 2013-09-27 LAB — CBC WITH DIFFERENTIAL/PLATELET
BASOS PCT: 0.8 %
Basophil #: 0.1 10*3/uL (ref 0.0–0.1)
Eosinophil #: 0.8 10*3/uL — ABNORMAL HIGH (ref 0.0–0.7)
Eosinophil %: 5.3 %
HCT: 24.4 % — ABNORMAL LOW (ref 40.0–52.0)
HGB: 8.2 g/dL — AB (ref 13.0–18.0)
LYMPHS ABS: 2.9 10*3/uL (ref 1.0–3.6)
LYMPHS PCT: 18.8 %
MCH: 28.7 pg (ref 26.0–34.0)
MCHC: 33.7 g/dL (ref 32.0–36.0)
MCV: 85 fL (ref 80–100)
Monocyte #: 1 x10 3/mm (ref 0.2–1.0)
Monocyte %: 6.6 %
NEUTROS PCT: 68.5 %
Neutrophil #: 10.7 10*3/uL — ABNORMAL HIGH (ref 1.4–6.5)
PLATELETS: 395 10*3/uL (ref 150–440)
RBC: 2.87 10*6/uL — AB (ref 4.40–5.90)
RDW: 15.8 % — ABNORMAL HIGH (ref 11.5–14.5)
WBC: 15.6 10*3/uL — ABNORMAL HIGH (ref 3.8–10.6)

## 2013-09-27 LAB — BASIC METABOLIC PANEL
Anion Gap: 7 (ref 7–16)
BUN: 16 mg/dL (ref 7–18)
Calcium, Total: 7.8 mg/dL — ABNORMAL LOW (ref 8.5–10.1)
Chloride: 110 mmol/L — ABNORMAL HIGH (ref 98–107)
Co2: 27 mmol/L (ref 21–32)
Creatinine: 0.92 mg/dL (ref 0.60–1.30)
EGFR (Non-African Amer.): >60
Glucose: 144 mg/dL — ABNORMAL HIGH (ref 65–99)
Osmolality: 291 (ref 275–301)
Potassium: 3.4 mmol/L — ABNORMAL LOW (ref 3.5–5.1)
Sodium: 144 mmol/L (ref 136–145)

## 2013-09-28 DIAGNOSIS — I369 Nonrheumatic tricuspid valve disorder, unspecified: Secondary | ICD-10-CM

## 2013-09-28 LAB — BASIC METABOLIC PANEL
Anion Gap: 5 — ABNORMAL LOW (ref 7–16)
BUN: 14 mg/dL (ref 7–18)
CALCIUM: 7.8 mg/dL — AB (ref 8.5–10.1)
CO2: 32 mmol/L (ref 21–32)
Chloride: 107 mmol/L (ref 98–107)
Creatinine: 0.89 mg/dL (ref 0.60–1.30)
EGFR (African American): >60
EGFR (Non-African Amer.): >60
Glucose: 80 mg/dL (ref 65–99)
Osmolality: 286 (ref 275–301)
Potassium: 3.2 mmol/L — ABNORMAL LOW (ref 3.5–5.1)
Sodium: 144 mmol/L (ref 136–145)

## 2013-09-28 LAB — MAGNESIUM: Magnesium: 1.5 mg/dL — ABNORMAL LOW

## 2013-09-28 LAB — CBC WITH DIFFERENTIAL/PLATELET
Basophil #: 0.1 10*3/uL (ref 0.0–0.1)
Basophil %: 0.5 %
Eosinophil #: 0.1 10*3/uL (ref 0.0–0.7)
Eosinophil %: 0.7 %
HCT: 28.1 % — AB (ref 40.0–52.0)
HGB: 9.2 g/dL — ABNORMAL LOW (ref 13.0–18.0)
LYMPHS PCT: 18.3 %
Lymphocyte #: 3.5 10*3/uL (ref 1.0–3.6)
MCH: 27.9 pg (ref 26.0–34.0)
MCHC: 32.9 g/dL (ref 32.0–36.0)
MCV: 85 fL (ref 80–100)
MONOS PCT: 6.9 %
Monocyte #: 1.3 x10 3/mm — ABNORMAL HIGH (ref 0.2–1.0)
NEUTROS ABS: 14 10*3/uL — AB (ref 1.4–6.5)
NEUTROS PCT: 73.6 %
Platelet: 476 10*3/uL — ABNORMAL HIGH (ref 150–440)
RBC: 3.31 10*6/uL — ABNORMAL LOW (ref 4.40–5.90)
RDW: 15.4 % — AB (ref 11.5–14.5)
WBC: 19 10*3/uL — AB (ref 3.8–10.6)

## 2013-09-28 LAB — PHOSPHORUS: Phosphorus: 4.1 mg/dL (ref 2.5–4.9)

## 2013-09-29 LAB — CBC WITH DIFFERENTIAL/PLATELET
BASOS ABS: 0.1 10*3/uL (ref 0.0–0.1)
BASOS PCT: 0.7 %
EOS ABS: 0 10*3/uL (ref 0.0–0.7)
EOS PCT: 0.2 %
HCT: 24.7 % — ABNORMAL LOW (ref 40.0–52.0)
HGB: 7.8 g/dL — ABNORMAL LOW (ref 13.0–18.0)
Lymphocyte #: 3.1 10*3/uL (ref 1.0–3.6)
Lymphocyte %: 16 %
MCH: 27.2 pg (ref 26.0–34.0)
MCHC: 31.4 g/dL — ABNORMAL LOW (ref 32.0–36.0)
MCV: 87 fL (ref 80–100)
Monocyte #: 2.2 x10 3/mm — ABNORMAL HIGH (ref 0.2–1.0)
Monocyte %: 11.2 %
Neutrophil #: 14.1 10*3/uL — ABNORMAL HIGH (ref 1.4–6.5)
Neutrophil %: 71.9 %
PLATELETS: 516 10*3/uL — AB (ref 150–440)
RBC: 2.86 10*6/uL — AB (ref 4.40–5.90)
RDW: 15 % — AB (ref 11.5–14.5)
WBC: 19.6 10*3/uL — AB (ref 3.8–10.6)

## 2013-09-29 LAB — BASIC METABOLIC PANEL
ANION GAP: 10 (ref 7–16)
BUN: 15 mg/dL (ref 7–18)
CHLORIDE: 104 mmol/L (ref 98–107)
CREATININE: 0.99 mg/dL (ref 0.60–1.30)
Calcium, Total: 8 mg/dL — ABNORMAL LOW (ref 8.5–10.1)
Co2: 25 mmol/L (ref 21–32)
Glucose: 215 mg/dL — ABNORMAL HIGH (ref 65–99)
Osmolality: 285 (ref 275–301)
Potassium: 3.4 mmol/L — ABNORMAL LOW (ref 3.5–5.1)
Sodium: 139 mmol/L (ref 136–145)

## 2013-09-29 LAB — VANCOMYCIN, TROUGH: VANCOMYCIN, TROUGH: 12 ug/mL (ref 10–20)

## 2013-09-29 LAB — CLOSTRIDIUM DIFFICILE(ARMC)

## 2013-09-30 LAB — CBC WITH DIFFERENTIAL/PLATELET
Basophil #: 0.1 10*3/uL (ref 0.0–0.1)
Basophil %: 0.7 %
Eosinophil #: 0.1 10*3/uL (ref 0.0–0.7)
Eosinophil %: 0.5 %
HCT: 24.8 % — AB (ref 40.0–52.0)
HGB: 7.9 g/dL — ABNORMAL LOW (ref 13.0–18.0)
LYMPHS ABS: 2 10*3/uL (ref 1.0–3.6)
Lymphocyte %: 10.6 %
MCH: 27.7 pg (ref 26.0–34.0)
MCHC: 32 g/dL (ref 32.0–36.0)
MCV: 87 fL (ref 80–100)
Monocyte #: 3 x10 3/mm — ABNORMAL HIGH (ref 0.2–1.0)
Monocyte %: 15.8 %
NEUTROS ABS: 13.6 10*3/uL — AB (ref 1.4–6.5)
Neutrophil %: 72.4 %
Platelet: 594 10*3/uL — ABNORMAL HIGH (ref 150–440)
RBC: 2.86 10*6/uL — ABNORMAL LOW (ref 4.40–5.90)
RDW: 14.4 % (ref 11.5–14.5)
WBC: 18.9 10*3/uL — AB (ref 3.8–10.6)

## 2013-09-30 LAB — BASIC METABOLIC PANEL
Anion Gap: 3 — ABNORMAL LOW (ref 7–16)
BUN: 10 mg/dL (ref 7–18)
CALCIUM: 8.2 mg/dL — AB (ref 8.5–10.1)
CO2: 30 mmol/L (ref 21–32)
Chloride: 105 mmol/L (ref 98–107)
Creatinine: 0.82 mg/dL (ref 0.60–1.30)
Glucose: 40 mg/dL — CL (ref 65–99)
OSMOLALITY: 271 (ref 275–301)
POTASSIUM: 3.1 mmol/L — AB (ref 3.5–5.1)
Sodium: 138 mmol/L (ref 136–145)

## 2013-10-01 LAB — CBC WITH DIFFERENTIAL/PLATELET
Basophil #: 0.1 10*3/uL (ref 0.0–0.1)
Basophil %: 0.7 %
EOS ABS: 0.1 10*3/uL (ref 0.0–0.7)
EOS PCT: 0.8 %
HCT: 22.8 % — AB (ref 40.0–52.0)
HGB: 7.4 g/dL — ABNORMAL LOW (ref 13.0–18.0)
LYMPHS ABS: 2.3 10*3/uL (ref 1.0–3.6)
Lymphocyte %: 13.3 %
MCH: 28 pg (ref 26.0–34.0)
MCHC: 32.5 g/dL (ref 32.0–36.0)
MCV: 86 fL (ref 80–100)
MONO ABS: 1.8 x10 3/mm — AB (ref 0.2–1.0)
Monocyte %: 10.4 %
Neutrophil #: 12.8 10*3/uL — ABNORMAL HIGH (ref 1.4–6.5)
Neutrophil %: 74.8 %
PLATELETS: 668 10*3/uL — AB (ref 150–440)
RBC: 2.64 10*6/uL — ABNORMAL LOW (ref 4.40–5.90)
RDW: 14.9 % — ABNORMAL HIGH (ref 11.5–14.5)
WBC: 17.1 10*3/uL — ABNORMAL HIGH (ref 3.8–10.6)

## 2013-10-01 LAB — BASIC METABOLIC PANEL
Anion Gap: 6 — ABNORMAL LOW (ref 7–16)
BUN: 11 mg/dL (ref 7–18)
CO2: 29 mmol/L (ref 21–32)
Calcium, Total: 7.8 mg/dL — ABNORMAL LOW (ref 8.5–10.1)
Chloride: 97 mmol/L — ABNORMAL LOW (ref 98–107)
Creatinine: 0.98 mg/dL (ref 0.60–1.30)
EGFR (African American): >60
EGFR (Non-African Amer.): >60
Glucose: 288 mg/dL — ABNORMAL HIGH (ref 65–99)
Osmolality: 274 (ref 275–301)
Potassium: 3.1 mmol/L — ABNORMAL LOW (ref 3.5–5.1)
Sodium: 132 mmol/L — ABNORMAL LOW (ref 136–145)

## 2013-10-01 LAB — VANCOMYCIN, TROUGH: VANCOMYCIN, TROUGH: 19 ug/mL (ref 10–20)

## 2013-10-01 LAB — IRON: IRON: 20 ug/dL — AB (ref 65–175)

## 2013-10-01 LAB — LACTATE DEHYDROGENASE: LDH: 226 U/L (ref 85–241)

## 2013-10-01 LAB — MAGNESIUM: Magnesium: 1.4 mg/dL — ABNORMAL LOW

## 2013-10-01 LAB — POTASSIUM: Potassium: 3.5 mmol/L (ref 3.5–5.1)

## 2013-10-01 LAB — OCCULT BLOOD X 1 CARD TO LAB, STOOL: Occult Blood, Feces: POSITIVE

## 2013-10-01 LAB — FERRITIN: Ferritin (ARMC): 194 ng/mL (ref 8–388)

## 2013-10-02 LAB — BASIC METABOLIC PANEL
Anion Gap: 1 — ABNORMAL LOW (ref 7–16)
BUN: 9 mg/dL (ref 7–18)
CREATININE: 0.79 mg/dL (ref 0.60–1.30)
Calcium, Total: 8 mg/dL — ABNORMAL LOW (ref 8.5–10.1)
Chloride: 104 mmol/L (ref 98–107)
Co2: 32 mmol/L (ref 21–32)
EGFR (African American): >60
Glucose: 100 mg/dL — ABNORMAL HIGH (ref 65–99)
OSMOLALITY: 273 (ref 275–301)
POTASSIUM: 3.1 mmol/L — AB (ref 3.5–5.1)
Sodium: 137 mmol/L (ref 136–145)

## 2013-10-02 LAB — CBC WITH DIFFERENTIAL/PLATELET
BASOS ABS: 0.2 10*3/uL — AB (ref 0.0–0.1)
BASOS PCT: 1.1 %
EOS PCT: 1.4 %
Eosinophil #: 0.3 10*3/uL (ref 0.0–0.7)
HCT: 26.6 % — AB (ref 40.0–52.0)
HGB: 8.6 g/dL — AB (ref 13.0–18.0)
Lymphocyte #: 2.1 10*3/uL (ref 1.0–3.6)
Lymphocyte %: 11.3 %
MCH: 27.6 pg (ref 26.0–34.0)
MCHC: 32.3 g/dL (ref 32.0–36.0)
MCV: 85 fL (ref 80–100)
MONOS PCT: 6.2 %
Monocyte #: 1.1 x10 3/mm — ABNORMAL HIGH (ref 0.2–1.0)
Neutrophil #: 14.6 10*3/uL — ABNORMAL HIGH (ref 1.4–6.5)
Neutrophil %: 80 %
Platelet: 768 10*3/uL — ABNORMAL HIGH (ref 150–440)
RBC: 3.12 10*6/uL — ABNORMAL LOW (ref 4.40–5.90)
RDW: 15.8 % — ABNORMAL HIGH (ref 11.5–14.5)
WBC: 18.3 10*3/uL — ABNORMAL HIGH (ref 3.8–10.6)

## 2013-10-02 LAB — CLOSTRIDIUM DIFFICILE(ARMC)

## 2013-10-02 LAB — MAGNESIUM: MAGNESIUM: 1.6 mg/dL — AB

## 2013-10-02 LAB — OCCULT BLOOD X 1 CARD TO LAB, STOOL: Occult Blood, Feces: POSITIVE

## 2013-10-02 LAB — VANCOMYCIN, TROUGH: VANCOMYCIN, TROUGH: 30 ug/mL — AB (ref 10–20)

## 2013-10-03 LAB — BASIC METABOLIC PANEL
Anion Gap: 4 — ABNORMAL LOW (ref 7–16)
BUN: 9 mg/dL (ref 7–18)
Calcium, Total: 8 mg/dL — ABNORMAL LOW (ref 8.5–10.1)
Chloride: 102 mmol/L (ref 98–107)
Co2: 31 mmol/L (ref 21–32)
Creatinine: 0.88 mg/dL (ref 0.60–1.30)
EGFR (African American): >60
Glucose: 150 mg/dL — ABNORMAL HIGH (ref 65–99)
Osmolality: 275 (ref 275–301)
POTASSIUM: 3.4 mmol/L — AB (ref 3.5–5.1)
SODIUM: 137 mmol/L (ref 136–145)

## 2013-10-03 LAB — CBC WITH DIFFERENTIAL/PLATELET
BASOS ABS: 0.3 10*3/uL — AB (ref 0.0–0.1)
Basophil %: 1.5 %
EOS PCT: 1.3 %
Eosinophil #: 0.2 10*3/uL (ref 0.0–0.7)
HCT: 25.6 % — AB (ref 40.0–52.0)
HGB: 8.3 g/dL — ABNORMAL LOW (ref 13.0–18.0)
LYMPHS ABS: 2 10*3/uL (ref 1.0–3.6)
Lymphocyte %: 12 %
MCH: 27.7 pg (ref 26.0–34.0)
MCHC: 32.4 g/dL (ref 32.0–36.0)
MCV: 85 fL (ref 80–100)
Monocyte #: 1 x10 3/mm (ref 0.2–1.0)
Monocyte %: 5.9 %
NEUTROS PCT: 79.3 %
Neutrophil #: 13.1 10*3/uL — ABNORMAL HIGH (ref 1.4–6.5)
PLATELETS: 834 10*3/uL — AB (ref 150–440)
RBC: 3 10*6/uL — ABNORMAL LOW (ref 4.40–5.90)
RDW: 16.1 % — ABNORMAL HIGH (ref 11.5–14.5)
WBC: 16.5 10*3/uL — AB (ref 3.8–10.6)

## 2013-10-03 LAB — CULTURE, BLOOD (SINGLE)

## 2013-10-04 LAB — BASIC METABOLIC PANEL
Anion Gap: 5 — ABNORMAL LOW (ref 7–16)
BUN: 8 mg/dL (ref 7–18)
CALCIUM: 8.3 mg/dL — AB (ref 8.5–10.1)
CHLORIDE: 100 mmol/L (ref 98–107)
CO2: 31 mmol/L (ref 21–32)
CREATININE: 0.74 mg/dL (ref 0.60–1.30)
EGFR (Non-African Amer.): >60
Glucose: 231 mg/dL — ABNORMAL HIGH (ref 65–99)
OSMOLALITY: 278 (ref 275–301)
Potassium: 3.6 mmol/L (ref 3.5–5.1)
SODIUM: 136 mmol/L (ref 136–145)

## 2013-10-04 LAB — STOOL CULTURE

## 2013-10-04 LAB — VANCOMYCIN, TROUGH: VANCOMYCIN, TROUGH: 16 ug/mL (ref 10–20)

## 2013-10-05 DIAGNOSIS — I330 Acute and subacute infective endocarditis: Secondary | ICD-10-CM

## 2013-10-05 LAB — BASIC METABOLIC PANEL
ANION GAP: 4 — AB (ref 7–16)
BUN: 10 mg/dL (ref 7–18)
CALCIUM: 8.5 mg/dL (ref 8.5–10.1)
CHLORIDE: 102 mmol/L (ref 98–107)
CO2: 29 mmol/L (ref 21–32)
Creatinine: 0.77 mg/dL (ref 0.60–1.30)
EGFR (Non-African Amer.): >60
GLUCOSE: 103 mg/dL — AB (ref 65–99)
Osmolality: 269 (ref 275–301)
POTASSIUM: 3.4 mmol/L — AB (ref 3.5–5.1)
Sodium: 135 mmol/L — ABNORMAL LOW (ref 136–145)

## 2013-10-05 LAB — WBC: WBC: 13.7 10*3/uL — ABNORMAL HIGH (ref 3.8–10.6)

## 2013-10-06 LAB — VANCOMYCIN, TROUGH: Vancomycin, Trough: 16 ug/mL

## 2013-10-07 ENCOUNTER — Inpatient Hospital Stay
Admission: RE | Admit: 2013-10-07 | Discharge: 2013-10-08 | Disposition: A | Discharge status: Nursing Home (Includes ICF) | Payer: Medicaid Other | Source: Ambulatory Visit | Attending: Internal Medicine | Admitting: Internal Medicine

## 2013-10-07 ENCOUNTER — Non-Acute Institutional Stay (SKILLED_NURSING_FACILITY): Payer: Medicaid Other | Admitting: Internal Medicine

## 2013-10-07 DIAGNOSIS — E1010 Type 1 diabetes mellitus with ketoacidosis without coma: Secondary | ICD-10-CM

## 2013-10-07 DIAGNOSIS — Z8701 Personal history of pneumonia (recurrent): Secondary | ICD-10-CM | POA: Insufficient documentation

## 2013-10-07 DIAGNOSIS — G9340 Encephalopathy, unspecified: Secondary | ICD-10-CM | POA: Insufficient documentation

## 2013-10-07 DIAGNOSIS — R7881 Bacteremia: Secondary | ICD-10-CM | POA: Insufficient documentation

## 2013-10-07 DIAGNOSIS — E109 Type 1 diabetes mellitus without complications: Secondary | ICD-10-CM | POA: Insufficient documentation

## 2013-10-07 DIAGNOSIS — N179 Acute kidney failure, unspecified: Secondary | ICD-10-CM

## 2013-10-07 DIAGNOSIS — A4902 Methicillin resistant Staphylococcus aureus infection, unspecified site: Secondary | ICD-10-CM

## 2013-10-07 DIAGNOSIS — D638 Anemia in other chronic diseases classified elsewhere: Secondary | ICD-10-CM

## 2013-10-07 DIAGNOSIS — R569 Unspecified convulsions: Secondary | ICD-10-CM | POA: Insufficient documentation

## 2013-10-07 LAB — GLUCOSE, CAPILLARY
Glucose-Capillary: 388 mg/dL — ABNORMAL HIGH (ref 70–99)
Glucose-Capillary: 118 mg/dL — ABNORMAL HIGH (ref 70–99)
Glucose-Capillary: 548 mg/dL — ABNORMAL HIGH (ref 70–99)
Glucose-Capillary: 458 mg/dL — ABNORMAL HIGH (ref 70–99)

## 2013-10-07 NOTE — Progress Notes (Signed)
Patient ID: Maurice Davidson, male   DOB: 04-12-1986, 28 y.o.   MRN: [id number]   This is an acute visit.  Level of care skilled.  Facility [facility]  Chief complaint-acute visit status post hospitalization for encephalopathy thought to be secondary to diabetic ketoacidosis-sepsis.  History of present illness.  Patient is a very pleasant 28 year old male who presented to the hospital with altered mental status-he was encephalopathic and was noted to be in DKA with a blood sugar of 1100.  He was intubated and treated with broad-spectrum antibiotics source of infection was thought to be MRSA pneumonia complicated with bacteremia-in continues on long-term vancomycin apparently a 4 week course he is here to complete treatment for this. He has completed a course of Zosyn as well for what was thought to be aspiration pneumonia.    He did have a TTE andTEE which did not reveal any vegetations.  His DKA resolved with insulin drip Patient did have history hypoglycemia and hyperglycemia post extubation-he was discharged on Levemir 10 units each bedtime with 5 units of Aspart prior to meals and apparently that worked reasonably well  The patient also apparently had 3 back-to-back seizures during his stay was monitored in the ICU and started on Keppra there were no further seizures-apparently he does not have a previous seizure history apparently there is some history of substance abuse.  Previous medical history. Toxic metabolic encephalopathy.  Diabetic ketoacidosis.  Type 1 diabetes.  Sepsis.  MRSA bacteremia and pneumonia  Seizures while in hospital.  Aspiration pneumonia.  Acute renal failure.  Acute on chronic kidney disease.  Electrolyte abnormalities hypokalemia -- magnesium-apparently resolved in repleted in hospital.  Chronic anemia.  History of GI bleed.  History depression  Past surgical history.  EGD and colonoscopy for gastroparesis.  Family history-grandparents  with history of diabetes-mother with history of osteoporosis.  Social history-patient lives with his parents smokes a pack and a half a day.  Has denied alcohol use-apparently some previous history of substance abuse.  Marland Kitchen  Hospital studies. 10/05/2013 Transesophageal echocardiogram-normal study left ventricular ejection fraction 55-60%-no vegetations noted.Marland Kitchen  09/30/2013.  Chest x-ray-increasing right middle lobe unlikely right upper lobe airspace disease-this is worsened for progressive pneumonia or aspiration  Medications.  Lisinopril  5 mg daily.  Paroxetine 80 mg a day.  Protonix 40 mg daily.  Keppra 500 mg twice a day.  Levimer 10 units subcutaneous each bedtime.  Aspart insulin 5 units 3 times a day before meals.  Glucernao shake 3 times a day with meals.  Ferrous sulfate 325 mg twice a day  Vancomycin 750 mg IV every 12 hours until 10/26/2013  --Review of systems.  In general denies any fever or chills says he feels like his sugars a bit high --describes  The  feeling as not dizziness but he cfeels different  Skin-does not complaining of itching or rashes he does have tattoos.  Head ears eyes nose mouth and throat-has prescription lenses does not complaining of visual changes or sore throat.  Respiratory--no shortness of breath or cough     Cardiac no chest pain.  GI does not complaining of abdominal pain nausea or vomiting diarrhea or constipation.  GU does not complain of dysuria.  Muscle skeletal is not complaining of any joint pain.  Neurologic as stated above says he feels a little different like his sugars are high but does not complaining of dizziness headache syncopal type feelings  Psych-history of depression.  Physical exam.  Temperature 98.9 pulse 82 respirations  20 blood pressure 119/72 of note blood sugar is 548.  In general this is a somewhat frail-appearing very pleasant young male in no distress.  His skin is warm and dry he does  have several tattoos most notably of the shoulders bilaterally--.  Eyes pupils appear equal round reactive to light sclera and conjunctiva are clear visual acuity appears grossly intact he does have prescription lenses.  Chest is clear to auscultation no labored breathing.  Heart has regular rate and rhythm without murmur gallop or rub.  Abdomen is soft nontender with positive bowel sounds  Muscle skeletal-is able to ambulate without difficulty I did not note any deformities-strength appears to be intact to all 4 extremities.  Neurologic no focal deficits appreciated cranial nerves grossly intact speech is clear.  Psych-he is alert and oriented x3 pleasant and appropriate.  Labs.  10/05/2013.  WBC 13.7.  Sodium 135 potassium 3.4 BUN 10 creatinine 0.77.  10/03/2013.  WBC 16.5 hemoglobin 8.3 platelets 834 he  Gerry 16 2015.  White count 18.3 hemoglobin 8.6 platelets 768.  1- 16 2015.  Magnesium 1.4. The circumflex plan.  Hemoglobin 7.4 platelets 668   Assessment and plan.  #1-history of MRSA bacteremia-pneumonia-continues on vancomycin until February 10-pharmacy to monitor levels he is afebrile appears to be stable in this regard.  #2-history of type 1 diabetes with ketoacidosis-blood sugar is significantly elevated --he has a standing order for 5 units of aspart-however was significantly elevated sugar will give 8 units-patient states this is pretty much what he has received in the past for this high sugar--   need to be rechecked throughout the evening--  He does continue on Levemir routinely at night 10 units as well as the aspart 5 units routinely before meals for blood sugar greater than 150.  #3-history seizure disorder he continues on Keppra apparently this has been stable since Keppra was started continue to monitor.  #4-history of chronic anemia-we'll recheck hemoglobin tomorrow-appears to run in the 8 range recently he is on iron as well.--Update CBC  tomorrow  #5-history of acute on chronic renal disease-Will update metabolic panel as well apparently this improved during his hospitalization.  #6-apparently some history of gastrointestinal bleed in the hospital he was started on protonix-Will guaiac stools here and monitor hemoglobin-apparently he had occult positive stools in the hospital and GI consult was considered although unclear exactly what was determined from the information I've been able to access-- he was discharged on protonix  #7  history of low magnesium-Will update level tomorrow-  CPT---99310-of note greater than 35 minutes spent assessing patient-reviewing hospital records and coordinating formulating a plan of care for numerous diagnoses--of note greater than 50% of time spent coordinating plan of care          .    Marland Kitchen      Marland Kitchen

## 2013-10-08 ENCOUNTER — Emergency Department (HOSPITAL_COMMUNITY): Payer: Medicaid Other

## 2013-10-08 ENCOUNTER — Inpatient Hospital Stay
Admission: RE | Admit: 2013-10-08 | Discharge: 2013-10-22 | Disposition: A | Discharge status: Home / Self Care | Payer: Medicaid Other | Source: Ambulatory Visit | Attending: Internal Medicine | Admitting: Internal Medicine

## 2013-10-08 ENCOUNTER — Emergency Department (HOSPITAL_COMMUNITY)
Admission: EM | Admit: 2013-10-08 | Discharge: 2013-10-08 | Disposition: A | Discharge status: Home / Self Care | Payer: Medicaid Other | Attending: Emergency Medicine | Admitting: Emergency Medicine

## 2013-10-08 ENCOUNTER — Non-Acute Institutional Stay (SKILLED_NURSING_FACILITY): Payer: Medicaid Other | Admitting: Internal Medicine

## 2013-10-08 ENCOUNTER — Encounter (HOSPITAL_COMMUNITY): Payer: Self-pay | Admitting: Emergency Medicine

## 2013-10-08 DIAGNOSIS — Z8669 Personal history of other diseases of the nervous system and sense organs: Secondary | ICD-10-CM | POA: Insufficient documentation

## 2013-10-08 DIAGNOSIS — Z8614 Personal history of Methicillin resistant Staphylococcus aureus infection: Secondary | ICD-10-CM | POA: Insufficient documentation

## 2013-10-08 DIAGNOSIS — J159 Unspecified bacterial pneumonia: Secondary | ICD-10-CM | POA: Insufficient documentation

## 2013-10-08 DIAGNOSIS — E109 Type 1 diabetes mellitus without complications: Secondary | ICD-10-CM | POA: Insufficient documentation

## 2013-10-08 DIAGNOSIS — Z794 Long term (current) use of insulin: Secondary | ICD-10-CM | POA: Insufficient documentation

## 2013-10-08 DIAGNOSIS — D649 Anemia, unspecified: Secondary | ICD-10-CM | POA: Insufficient documentation

## 2013-10-08 DIAGNOSIS — Z8619 Personal history of other infectious and parasitic diseases: Secondary | ICD-10-CM | POA: Insufficient documentation

## 2013-10-08 DIAGNOSIS — D72829 Elevated white blood cell count, unspecified: Secondary | ICD-10-CM

## 2013-10-08 DIAGNOSIS — D473 Essential (hemorrhagic) thrombocythemia: Secondary | ICD-10-CM | POA: Insufficient documentation

## 2013-10-08 DIAGNOSIS — Y95 Nosocomial condition: Secondary | ICD-10-CM

## 2013-10-08 DIAGNOSIS — Z87448 Personal history of other diseases of urinary system: Secondary | ICD-10-CM | POA: Insufficient documentation

## 2013-10-08 DIAGNOSIS — D75839 Thrombocytosis, unspecified: Secondary | ICD-10-CM

## 2013-10-08 DIAGNOSIS — G40909 Epilepsy, unspecified, not intractable, without status epilepticus: Secondary | ICD-10-CM | POA: Insufficient documentation

## 2013-10-08 DIAGNOSIS — Z79899 Other long term (current) drug therapy: Secondary | ICD-10-CM | POA: Insufficient documentation

## 2013-10-08 DIAGNOSIS — F411 Generalized anxiety disorder: Secondary | ICD-10-CM

## 2013-10-08 DIAGNOSIS — J189 Pneumonia, unspecified organism: Secondary | ICD-10-CM

## 2013-10-08 DIAGNOSIS — E875 Hyperkalemia: Secondary | ICD-10-CM

## 2013-10-08 DIAGNOSIS — Z8719 Personal history of other diseases of the digestive system: Secondary | ICD-10-CM | POA: Insufficient documentation

## 2013-10-08 HISTORY — DX: Type 2 diabetes mellitus without complications: E11.9

## 2013-10-08 HISTORY — DX: Disorder of kidney and ureter, unspecified: N28.9

## 2013-10-08 HISTORY — DX: Unspecified convulsions: R56.9

## 2013-10-08 HISTORY — DX: Gastroparesis: K31.84

## 2013-10-08 HISTORY — DX: Sepsis, unspecified organism: A41.9

## 2013-10-08 HISTORY — DX: Carrier or suspected carrier of methicillin resistant Staphylococcus aureus: Z22.322

## 2013-10-08 HISTORY — DX: Pneumonitis due to inhalation of food and vomit: J69.0

## 2013-10-08 HISTORY — DX: Encephalopathy, unspecified: G93.40

## 2013-10-08 HISTORY — DX: Personal history of other diseases of the digestive system: Z87.19

## 2013-10-08 HISTORY — DX: Anemia, unspecified: D64.9

## 2013-10-08 LAB — COMPREHENSIVE METABOLIC PANEL
ALBUMIN: 2.8 g/dL — AB (ref 3.5–5.2)
ALK PHOS: 274 U/L — AB (ref 39–117)
ALT: 30 U/L (ref 0–53)
AST: 29 U/L (ref 0–37)
BILIRUBIN TOTAL: 0.2 mg/dL — AB (ref 0.3–1.2)
BUN: 15 mg/dL (ref 6–23)
CHLORIDE: 94 meq/L — AB (ref 96–112)
CO2: 27 mEq/L (ref 19–32)
Calcium: 9.5 mg/dL (ref 8.4–10.5)
Creatinine, Ser: 0.77 mg/dL (ref 0.50–1.35)
GFR calc Af Amer: >90 mL/min (ref >90)
GFR calc non Af Amer: >90 mL/min (ref >90)
GLUCOSE: 144 mg/dL — AB (ref 70–99)
POTASSIUM: 4.3 meq/L (ref 3.7–5.3)
Sodium: 135 mEq/L — ABNORMAL LOW (ref 137–147)
Total Protein: 7.5 g/dL (ref 6.0–8.3)

## 2013-10-08 LAB — GLUCOSE, CAPILLARY
GLUCOSE-CAPILLARY: 170 mg/dL — AB (ref 70–99)
Glucose-Capillary: 308 mg/dL — ABNORMAL HIGH (ref 70–99)
Glucose-Capillary: 340 mg/dL — ABNORMAL HIGH (ref 70–99)
Glucose-Capillary: 442 mg/dL — ABNORMAL HIGH (ref 70–99)

## 2013-10-08 LAB — TROPONIN I: Troponin I: <0.3 ng/mL (ref <0.30)

## 2013-10-08 LAB — LACTIC ACID, PLASMA: Lactic Acid, Venous: 2.6 mmol/L — ABNORMAL HIGH (ref 0.5–2.2)

## 2013-10-08 MED ORDER — ASPIRIN 81 MG PO CHEW: 81.0000 mg | CHEWABLE_TABLET | Freq: Every day | ORAL | Status: DC

## 2013-10-08 NOTE — ED Notes (Signed)
Pt here from [facility] for elevated platelet count.

## 2013-10-08 NOTE — ED Notes (Signed)
CRITICAL VALUE ALERT  Critical value received:  Platelets 1310, lab reported would send sample out to pathology.   Date of notification:  10/08/13  Time of notification:  1151  Critical value read back:yes  Nurse who received alert:  Parthenia Ames  MD notified (1st page):  Tammy Triplett,PA  Time of first page:  1151  MD notified (2nd page):  Time of second page:  Responding MD:  Delfina Redwood  Time MD responded:  1151

## 2013-10-08 NOTE — ED Provider Notes (Signed)
CSN: [id number]     Arrival date & time 10/08/13  4098 History   First MD Initiated Contact with Patient 10/08/13 0957     No chief complaint on file.  (Consider location/radiation/quality/duration/timing/severity/associated sxs/prior Treatment) HPI Comments: Maurice ISHMAN is a 28 y.o. male with hx of type I DM, seizures, renal disorder, and anemia who presents to the Emergency Department  from the [facility] for evaluation of abnormal lab values.  Patient was admitted to another hospital several weeks ago for encephalopathy thought to be secondary to diabetic ketoacidosis-sepsis.  It was believed that his source of infection was MRSA-related pneumonia.   He was then sent to [facility] for further treatment of IV Vancomycin.  His blood work from 10/07/13 revealed an elevated potassium and thrombocytosis of 1400.  Patient denies any pain or other complaints at this time.  He states he has been progressing well since his hospital stay and just arrived at the [facility] one day prior to this ED visit.  He denies headache, confusion, chest pain, dyspnea, fever, chills, vomiting, or dysuria   The history is provided by the patient and a relative.    Past Medical History  Diagnosis Date  . Diabetes mellitus without complication   . Seizures   . Renal disorder   . Anemia   . MRSA (methicillin resistant staph aureus) culture positive   . Sepsis   . Aspiration pneumonia   . H/O: GI bleed   . Gastroparesis   . Encephalopathy    Past Surgical History  Procedure Laterality Date  . Orthopedic surgery     No family history on file. History  Substance Use Topics  . Smoking status: Never Smoker   . Smokeless tobacco: Not on file  . Alcohol Use: No    Review of Systems  Constitutional: Negative for fever, chills, activity change and appetite change.  HENT: Negative for sore throat and trouble swallowing.   Eyes: Negative for visual disturbance.  Respiratory: Negative for cough, chest  tightness, shortness of breath and wheezing.   Gastrointestinal: Negative for nausea, vomiting, abdominal pain and abdominal distention.  Endocrine: Negative for polydipsia and polyuria.  Genitourinary: Negative for dysuria and flank pain.  Musculoskeletal: Negative for arthralgias, neck pain and neck stiffness.  Skin: Negative for color change and rash.  Neurological: Negative for dizziness, syncope, facial asymmetry, weakness, numbness and headaches.  Psychiatric/Behavioral: Negative for confusion.  All other systems reviewed and are negative.    Allergies  Review of patient's allergies indicates no known allergies.  Home Medications   Current Outpatient Rx  Name  Route  Sig  Dispense  Refill  . feeding supplement, GLUCERNA SHAKE, (GLUCERNA SHAKE) LIQD   Oral   Take 237 mLs by mouth 3 (three) times daily with meals.         . ferrous sulfate 325 (65 FE) MG tablet   Oral   Take 325 mg by mouth 2 (two) times daily with a meal.         . insulin aspart (NOVOLOG) 100 UNIT/ML injection   Subcutaneous   Inject 5 Units into the skin 3 (three) times daily with meals. Hold if CBG <150         . insulin detemir (LEVEMIR) 100 UNIT/ML injection   Subcutaneous   Inject 10 Units into the skin at bedtime.         . levETIRAcetam (KEPPRA) 500 MG tablet   Oral   Take 500 mg by mouth 2 (  two) times daily.         Marland Kitchen lisinopril (PRINIVIL,ZESTRIL) 5 MG tablet   Oral   Take 5 mg by mouth daily.         Marland Kitchen omeprazole (PRILOSEC) 20 MG capsule   Oral   Take 20 mg by mouth daily.         Marland Kitchen PARoxetine (PAXIL) 40 MG tablet   Oral   Take 40 mg by mouth daily.         . sodium chloride 0.9 % SOLN 150 mL with vancomycin 1000 MG SOLR 750 mg   Intravenous   Inject 750 mg into the vein every 12 (twelve) hours.          BP 122/76  Pulse 93  Temp(Src) 98.6 F (37 C) (Oral)  Resp 18  Ht 5\' 6"  (1.676 m)  Wt 109 lb (49.442 kg)  BMI 17.60 kg/m2  SpO2 98% Physical Exam   Nursing note and vitals reviewed. Constitutional: He is oriented to person, place, and time. He appears well-developed and well-nourished. No distress.  HENT:  Head: Normocephalic and atraumatic.  Mouth/Throat: Oropharynx is clear and moist.  Eyes: Conjunctivae and EOM are normal. Pupils are equal, round, and reactive to light.  Neck: Normal range of motion. Neck supple.  Cardiovascular: Normal rate, regular rhythm, normal heart sounds and intact distal pulses.   No murmur heard. Pulmonary/Chest: Effort normal and breath sounds normal. No respiratory distress. He has no wheezes. He has no rales. He exhibits no tenderness.  Abdominal: Soft. He exhibits no distension. There is no tenderness. There is no rebound and no guarding.  Musculoskeletal: Normal range of motion. He exhibits no edema.  Lymphadenopathy:    He has no cervical adenopathy.  Neurological: He is alert and oriented to person, place, and time. He exhibits normal muscle tone. Coordination normal.  Skin: Skin is warm and dry. No rash noted.  Psychiatric: He has a normal mood and affect. His behavior is normal. Thought content normal.    ED Course  Procedures (including critical care time) Labs Review Labs Reviewed  CBC WITH DIFFERENTIAL - Abnormal; Notable for the following:    WBC 15.2 (*)    RBC 3.28 (*)    Hemoglobin 9.1 (*)    HCT 29.0 (*)    RDW 16.0 (*)    Platelets 1310 (*)    Neutro Abs 10.5 (*)    Monocytes Absolute 1.4 (*)    Basophils Absolute 0.2 (*)    All other components within normal limits  COMPREHENSIVE METABOLIC PANEL - Abnormal; Notable for the following:    Sodium 135 (*)    Chloride 94 (*)    Glucose, Bld 144 (*)    Albumin 2.8 (*)    Alkaline Phosphatase 274 (*)    Total Bilirubin 0.2 (*)    All other components within normal limits  LACTIC ACID, PLASMA - Abnormal; Notable for the following:    Lactic Acid, Venous 2.6 (*)    All other components within normal limits  URINE CULTURE   TROPONIN I  PATHOLOGIST SMEAR REVIEW   Imaging Review Dg Chest 2 View  10/08/2013   CLINICAL DATA:  Elevated white blood cell count; recent pneumonia  EXAM: CHEST  2 VIEW  COMPARISON:  September 30, 2013  FINDINGS: There has been considerable interval clearing of infiltrate on the right. There remains patchy infiltrate in the right middle lobe. Elsewhere lungs are now clear. Heart size and pulmonary vascularity are normal. No  adenopathy. Central catheter tip is in the superior vena cava. No pneumothorax. There are healed rib fractures on the right.  IMPRESSION: Significant clearing of infiltrate from the right side. There is residual patchy infiltrate in the right middle lobe. No new opacity. Old healed rib fractures on the right. Central catheter tip in superior vena cava. No pneumothorax.   Electronically Signed   By: Lowella Grip M.D.   On: 10/08/2013 11:14    EKG Interpretation    Date/Time:  Friday October 08 2013 11:16:51 EST Ventricular Rate:  88 PR Interval:  148 QRS Duration: 90 QT Interval:  364 QTC Calculation: 440 R Axis:   89 Text Interpretation:  Normal sinus rhythm Possible Left atrial enlargement Baseline wander Early repolarization Borderline ECG No previous ECGs available Confirmed by [facility]  MD, Nunzio Cory [phone]) on 10/08/2013 12:42:24 PM           CRITICAL CARE Performed by: Kem Parkinson L. Total critical care time: 30 minutes  Critical care time was exclusive of separately billable procedures and treating other patients. Critical care was necessary to treat or prevent imminent or life-threatening deterioration. Critical care was time spent personally by me on the following activities: development of treatment plan with patient and/or surrogate as well as nursing, discussions with consultants, evaluation of patient's response to treatment, examination of patient, obtaining history from patient or surrogate, ordering and performing treatments and  interventions, ordering and review of laboratory studies, ordering and review of radiographic studies, pulse oximetry and re-evaluation of patient's condition.   MDM    Patient is non-toxic appearing.  VSS, no fever.  Ambulates with a steady gait, has ambulated to restroom several times.  Requesting food.  Has drank fluids.   Labs reviewed and discussed pt hx and care plan with Dr. Thurnell Garbe. CXR reveals improving right infiltrate and platelet ct remains elevated but improving. Previous hyperkalemia resolved.  1310 consulted Tom with Hem Onc.  Recommends starting ASA therapy for the thrombocytosis and he will see pt in clinic for follow-up.    Patient ambulates with pulse ox, remains at 98%RA . Patient continues to deny pain or other symptoms and requesting to go back to the nursing home.  Patient appears stable and agrees to f/u with the Hem Onc dept for further evaluation.  I will have pt start 81 mg ASA daily.   Pt stable in ED with no significant deterioration in condition. The patient appears reasonably screened and/or stabilized for discharge and I doubt any other medical condition or other [facility] requiring further screening, evaluation, or treatment in the ED at this time prior to discharge.   Hetvi Shawhan L. Vanessa Redcrest, PA-C 10/10/13 1339

## 2013-10-09 LAB — CBC WITH DIFFERENTIAL/PLATELET
BASOS ABS: 0.2 10*3/uL — AB (ref 0.0–0.1)
Basophils Relative: 1 % (ref 0–1)
EOS PCT: 3 % (ref 0–5)
Eosinophils Absolute: 0.5 10*3/uL (ref 0.0–0.7)
HCT: 29 % — ABNORMAL LOW (ref 39.0–52.0)
HEMOGLOBIN: 9.1 g/dL — AB (ref 13.0–17.0)
LYMPHS PCT: 17 % (ref 12–46)
Lymphs Abs: 2.6 10*3/uL (ref 0.7–4.0)
MCH: 27.7 pg (ref 26.0–34.0)
MCHC: 31.4 g/dL (ref 30.0–36.0)
MCV: 88.4 fL (ref 78.0–100.0)
MONOS PCT: 9 % (ref 3–12)
Monocytes Absolute: 1.4 10*3/uL — ABNORMAL HIGH (ref 0.1–1.0)
Neutro Abs: 10.5 10*3/uL — ABNORMAL HIGH (ref 1.7–7.7)
Neutrophils Relative %: 70 % (ref 43–77)
Platelets: 1310 10*3/uL (ref 150–400)
RBC: 3.28 MIL/uL — AB (ref 4.22–5.81)
RDW: 16 % — ABNORMAL HIGH (ref 11.5–15.5)
WBC: 15.2 10*3/uL — ABNORMAL HIGH (ref 4.0–10.5)

## 2013-10-10 DIAGNOSIS — D75839 Thrombocytosis, unspecified: Secondary | ICD-10-CM | POA: Insufficient documentation

## 2013-10-10 DIAGNOSIS — F419 Anxiety disorder, unspecified: Secondary | ICD-10-CM | POA: Insufficient documentation

## 2013-10-10 DIAGNOSIS — D473 Essential (hemorrhagic) thrombocythemia: Secondary | ICD-10-CM | POA: Insufficient documentation

## 2013-10-10 NOTE — Progress Notes (Signed)
Patient ID: Maurice Davidson, male   DOB: 1986/03/30, 28 y.o.   MRN: [id number]   This is an acute visit.  Level of care skilled.  Facility [facility].  Chief complaint-acute visit secondary to thrombocytosis.  History of present illness.  Patient is a pleasant 28 year old male with a very complicated medical history including a history of diabetes type 1 with recent DK which required hospitalization-- this was complicated also with the diagnosis of MRSA pneumonia he is here for an extended course of vancomycin IV We did do updated labs this morning which was significant for an elevated platelet count of over 1,400,000 mn-potassium also was elevated at 5.8 He was sent to the ER where they drew updated labs which showed a platelet count of over 1,300,000.  However his potassium was normal at 4.3.   he has been started on aspirin and was sent back to the facility.  Of note his white count also was elevated at 15.2-they did do a chest x-ray which actually showed significant clearing of the infiltrate on the right side residual patchy infiltrate right middle lobe.  They also drew a urine culture with results pending.  Currently patient has no acute complaints he is afebrile vital signs are stable-he does complain of anxiety and states he had been on Xanax at home 1 mg 3 times a day as needed.  Family medical social history as been reviewed per previous progress note on 10/07/2012.  Medications have been reviewed per MAR.  Review of systems.  General denies any fever or chills.  Skin does not complaining of any rash or itching.  Respiratory does not complain of cough or shortness of breath.  Cardiac does not complaining of any chest pain.  GI apparently does have some history stomach gastric pain he states this is gastroparesis.  According nursing staff he is having regular bowel movements.  GU-does not complaining of dysuria.  Muscle skeletal does have some diffuse pain complaints at  times.  Neurologic does not complaining of dizziness or headache day.  Psych does have a history of depression and anxiety.  Physical exam.  Temperature is 99.1 pulse is 96 respirations 20 blood pressure 120/74.  In general this is a pleasant frail appearing young male in no distress.  His skin is warm and dry.  Chest is clear to auscultation no labored breathing.  Heart is regular rate and rhythm without murmur gallop or rub.  Abdomen is soft does not appear to be tender although he does have issues with stomach pain in the past apparently this has been an issue at times in the facility as well.  Muscle skeletal and did not note any deformities he continues to ambulate at baseline.  Neurologic is grossly intact no focal weaknesses noted.  Psych he is alert and oriented x3 pleasant and cooperative.  Labs.  Generally 23 2015 ER labs-the [facility] 15.2 hemoglobin 9.1 platelets 1,310,000-neutrophils elevated at 10.5.  Sodium 135 potassium 4.3 BUN 15 creatinine 0.77.  AST 29-ALT 30-alkaline phosphatase 274-bilirubin 0.2.  Albumin 2.8-lactic acid 2.6 the  Labs initially done at facility.  [facility] 15.3 hemoglobin 9.5 platelets 1,412,000.  Assessment and plan.  #1-thrombocytosis-he has been started on aspirin clinically appears stable but will have to keep a very close eye on this apparently a hematology consult is pending-this certainly would be important.  This was discussed with Dr. Dellia Nims via phone and we will order a CBC on Monday, January 26. #  #2-leukocytosis-patient is being treated for pneumonia  with vancomycin IV-chest x-ray in hospital actually showed improvement of the pneumonia-he does not really complain of dysuria a urine culture is pending Will await those results-and also will update a CBC again on Monday as noted above--patient at times will have a low-grade temperature Will have to monitor this    #3-hyperkalemia-apparently this was more of a lab variant. On  the initial la--b it was normal in the hospital-will recheck this nonetheless on Monday as well   4-anxiety-patient had been on 1 mg 3 times a day when necessary number Xanax-will restart this at a lower dose and monitor we'll start at 0.5 mg 3 times a day when necessary.  5-history of abdominal issues gastroparesis-patient states he's benefited from Zofran in the past apparently this is intermittent at this point will start a when necessary Zofran and monitor.  #6-diabetes type 1-his blood sugar this evening is 417 this appears to be trending down-his blood sugars appear to drop fairly significantly overnight will not give him additional coverage he is on Levemir 10 units each bedtime and 5 units of NovoLog before meals.  This will have to be monitored closely of course.  [id number]    .

## 2013-10-11 ENCOUNTER — Other Ambulatory Visit: Payer: Self-pay | Admitting: *Deleted

## 2013-10-11 ENCOUNTER — Ambulatory Visit (HOSPITAL_COMMUNITY): Payer: Medicaid Other | Attending: Internal Medicine

## 2013-10-11 DIAGNOSIS — T82898A Other specified complication of vascular prosthetic devices, implants and grafts, initial encounter: Secondary | ICD-10-CM | POA: Insufficient documentation

## 2013-10-11 DIAGNOSIS — Y849 Medical procedure, unspecified as the cause of abnormal reaction of the patient, or of later complication, without mention of misadventure at the time of the procedure: Secondary | ICD-10-CM | POA: Insufficient documentation

## 2013-10-11 LAB — GLUCOSE, CAPILLARY
GLUCOSE-CAPILLARY: 208 mg/dL — AB (ref 70–99)
Glucose-Capillary: 128 mg/dL — ABNORMAL HIGH (ref 70–99)
Glucose-Capillary: 20 mg/dL — CL (ref 70–99)
Glucose-Capillary: 191 mg/dL — ABNORMAL HIGH (ref 70–99)
Glucose-Capillary: 165 mg/dL — ABNORMAL HIGH (ref 70–99)
Glucose-Capillary: 243 mg/dL — ABNORMAL HIGH (ref 70–99)

## 2013-10-11 MED ORDER — ALTEPLASE 2 MG IJ SOLR
2.0000 mg | Freq: Once | INTRAMUSCULAR | Status: AC
  Administered 2013-10-11: 2 mg

## 2013-10-11 MED ORDER — OXYCODONE HCL 5 MG PO TABS: ORAL_TABLET | ORAL | Status: DC

## 2013-10-11 MED ORDER — TRAMADOL HCL 50 MG PO TABS: ORAL_TABLET | ORAL | Status: DC

## 2013-10-11 MED ORDER — SODIUM CHLORIDE 0.9 % IJ SOLN
10.0000 mL | Freq: Two times a day (BID) | INTRAMUSCULAR | Status: DC
  Administered 2013-10-11: 10 mL

## 2013-10-11 MED ORDER — SODIUM CHLORIDE 0.9 % IJ SOLN: 10.0000 mL | INTRAMUSCULAR | Status: DC | PRN

## 2013-10-11 NOTE — ED Provider Notes (Signed)
Medical screening examination/treatment/procedure(s) were performed by non-physician practitioner and as supervising physician I was immediately available for consultation/collaboration.  EKG Interpretation    Date/Time:  Friday October 08 2013 11:16:51 EST Ventricular Rate:  88 PR Interval:  148 QRS Duration: 90 QT Interval:  364 QTC Calculation: 440 R Axis:   89 Text Interpretation:  Normal sinus rhythm Possible Left atrial enlargement Baseline wander Early repolarization Borderline ECG No previous ECGs available Confirmed by [facility]  MD, Canesha Tesfaye [phone]) on 10/08/2013 12:42:24 PM              Alfonzo Feller, DO 10/11/13 7517

## 2013-10-11 NOTE — Progress Notes (Signed)
picc flushed easy and got good blood return. Patient back to [facility]

## 2013-10-12 LAB — GLUCOSE, CAPILLARY
GLUCOSE-CAPILLARY: 161 mg/dL — AB (ref 70–99)
GLUCOSE-CAPILLARY: 187 mg/dL — AB (ref 70–99)
Glucose-Capillary: 78 mg/dL (ref 70–99)
Glucose-Capillary: 47 mg/dL — ABNORMAL LOW (ref 70–99)
Glucose-Capillary: 35 mg/dL — CL (ref 70–99)
Glucose-Capillary: 274 mg/dL — ABNORMAL HIGH (ref 70–99)
Glucose-Capillary: 206 mg/dL — ABNORMAL HIGH (ref 70–99)

## 2013-10-13 ENCOUNTER — Non-Acute Institutional Stay (SKILLED_NURSING_FACILITY): Payer: Medicaid Other | Admitting: Internal Medicine

## 2013-10-13 ENCOUNTER — Encounter (HOSPITAL_COMMUNITY): Payer: Medicaid Other

## 2013-10-13 DIAGNOSIS — A4902 Methicillin resistant Staphylococcus aureus infection, unspecified site: Secondary | ICD-10-CM

## 2013-10-13 DIAGNOSIS — D473 Essential (hemorrhagic) thrombocythemia: Secondary | ICD-10-CM

## 2013-10-13 DIAGNOSIS — D75839 Thrombocytosis, unspecified: Secondary | ICD-10-CM

## 2013-10-13 DIAGNOSIS — E1049 Type 1 diabetes mellitus with other diabetic neurological complication: Secondary | ICD-10-CM

## 2013-10-13 DIAGNOSIS — B9562 Methicillin resistant Staphylococcus aureus infection as the cause of diseases classified elsewhere: Secondary | ICD-10-CM

## 2013-10-13 DIAGNOSIS — D72829 Elevated white blood cell count, unspecified: Secondary | ICD-10-CM

## 2013-10-13 DIAGNOSIS — R7881 Bacteremia: Secondary | ICD-10-CM

## 2013-10-13 LAB — GLUCOSE, CAPILLARY
GLUCOSE-CAPILLARY: 82 mg/dL (ref 70–99)
GLUCOSE-CAPILLARY: 200 mg/dL — AB (ref 70–99)
Glucose-Capillary: 80 mg/dL (ref 70–99)
Glucose-Capillary: 79 mg/dL (ref 70–99)
Glucose-Capillary: 115 mg/dL — ABNORMAL HIGH (ref 70–99)
Glucose-Capillary: 177 mg/dL — ABNORMAL HIGH (ref 70–99)

## 2013-10-13 LAB — PATHOLOGIST SMEAR REVIEW
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
## 2013-10-14 ENCOUNTER — Other Ambulatory Visit: Payer: Self-pay | Admitting: *Deleted

## 2013-10-14 LAB — GLUCOSE, CAPILLARY
GLUCOSE-CAPILLARY: 195 mg/dL — AB (ref 70–99)
GLUCOSE-CAPILLARY: 248 mg/dL — AB (ref 70–99)
Glucose-Capillary: 198 mg/dL — ABNORMAL HIGH (ref 70–99)
Glucose-Capillary: 130 mg/dL — ABNORMAL HIGH (ref 70–99)
Glucose-Capillary: 480 mg/dL — ABNORMAL HIGH (ref 70–99)
Glucose-Capillary: 253 mg/dL — ABNORMAL HIGH (ref 70–99)

## 2013-10-15 LAB — GLUCOSE, CAPILLARY
GLUCOSE-CAPILLARY: 147 mg/dL — AB (ref 70–99)
GLUCOSE-CAPILLARY: 252 mg/dL — AB (ref 70–99)
Glucose-Capillary: 147 mg/dL — ABNORMAL HIGH (ref 70–99)
Glucose-Capillary: 150 mg/dL — ABNORMAL HIGH (ref 70–99)
Glucose-Capillary: 210 mg/dL — ABNORMAL HIGH (ref 70–99)
Glucose-Capillary: 466 mg/dL — ABNORMAL HIGH (ref 70–99)

## 2013-10-16 LAB — GLUCOSE, CAPILLARY
GLUCOSE-CAPILLARY: 205 mg/dL — AB (ref 70–99)
Glucose-Capillary: 202 mg/dL — ABNORMAL HIGH (ref 70–99)
Glucose-Capillary: 82 mg/dL (ref 70–99)
Glucose-Capillary: 368 mg/dL — ABNORMAL HIGH (ref 70–99)

## 2013-10-17 LAB — GLUCOSE, CAPILLARY
GLUCOSE-CAPILLARY: 518 mg/dL — AB (ref 70–99)
GLUCOSE-CAPILLARY: 247 mg/dL — AB (ref 70–99)
GLUCOSE-CAPILLARY: 277 mg/dL — AB (ref 70–99)
GLUCOSE-CAPILLARY: 346 mg/dL — AB (ref 70–99)
Glucose-Capillary: 376 mg/dL — ABNORMAL HIGH (ref 70–99)
Glucose-Capillary: 378 mg/dL — ABNORMAL HIGH (ref 70–99)
Glucose-Capillary: >600 mg/dL (ref 70–99)
Glucose-Capillary: 363 mg/dL — ABNORMAL HIGH (ref 70–99)

## 2013-10-18 LAB — GLUCOSE, CAPILLARY
GLUCOSE-CAPILLARY: 434 mg/dL — AB (ref 70–99)
GLUCOSE-CAPILLARY: 327 mg/dL — AB (ref 70–99)
Glucose-Capillary: 200 mg/dL — ABNORMAL HIGH (ref 70–99)
Glucose-Capillary: 395 mg/dL — ABNORMAL HIGH (ref 70–99)
Glucose-Capillary: 504 mg/dL — ABNORMAL HIGH (ref 70–99)
Glucose-Capillary: 530 mg/dL — ABNORMAL HIGH (ref 70–99)

## 2013-10-19 ENCOUNTER — Encounter (HOSPITAL_COMMUNITY): Payer: Medicaid Other | Attending: Hematology and Oncology

## 2013-10-19 ENCOUNTER — Encounter (HOSPITAL_COMMUNITY): Payer: Self-pay

## 2013-10-19 VITALS — BP 138/88 | HR 99 | Temp 97.8°F | Resp 20 | Ht 66.0 in | Wt 103.4 lb

## 2013-10-19 DIAGNOSIS — E109 Type 1 diabetes mellitus without complications: Secondary | ICD-10-CM | POA: Insufficient documentation

## 2013-10-19 DIAGNOSIS — B9562 Methicillin resistant Staphylococcus aureus infection as the cause of diseases classified elsewhere: Secondary | ICD-10-CM

## 2013-10-19 DIAGNOSIS — D473 Essential (hemorrhagic) thrombocythemia: Secondary | ICD-10-CM | POA: Insufficient documentation

## 2013-10-19 DIAGNOSIS — E1010 Type 1 diabetes mellitus with ketoacidosis without coma: Secondary | ICD-10-CM

## 2013-10-19 DIAGNOSIS — G40909 Epilepsy, unspecified, not intractable, without status epilepticus: Secondary | ICD-10-CM | POA: Insufficient documentation

## 2013-10-19 DIAGNOSIS — Z8781 Personal history of (healed) traumatic fracture: Secondary | ICD-10-CM | POA: Insufficient documentation

## 2013-10-19 DIAGNOSIS — R7881 Bacteremia: Secondary | ICD-10-CM

## 2013-10-19 DIAGNOSIS — Z794 Long term (current) use of insulin: Secondary | ICD-10-CM | POA: Insufficient documentation

## 2013-10-19 DIAGNOSIS — J690 Pneumonitis due to inhalation of food and vomit: Secondary | ICD-10-CM

## 2013-10-19 DIAGNOSIS — D75839 Thrombocytosis, unspecified: Secondary | ICD-10-CM

## 2013-10-19 DIAGNOSIS — Z8614 Personal history of Methicillin resistant Staphylococcus aureus infection: Secondary | ICD-10-CM | POA: Insufficient documentation

## 2013-10-19 LAB — CBC WITH DIFFERENTIAL/PLATELET
BASOS ABS: 0.2 10*3/uL — AB (ref 0.0–0.1)
Basophils Relative: 1 % (ref 0–1)
Eosinophils Absolute: 0.4 10*3/uL (ref 0.0–0.7)
Eosinophils Relative: 2 % (ref 0–5)
HCT: 29.9 % — ABNORMAL LOW (ref 39.0–52.0)
Hemoglobin: 9.4 g/dL — ABNORMAL LOW (ref 13.0–17.0)
LYMPHS PCT: 17 % (ref 12–46)
Lymphs Abs: 2.8 10*3/uL (ref 0.7–4.0)
MCH: 28.3 pg (ref 26.0–34.0)
MCHC: 31.4 g/dL (ref 30.0–36.0)
MCV: 90.1 fL (ref 78.0–100.0)
Monocytes Absolute: 1 10*3/uL (ref 0.1–1.0)
Monocytes Relative: 6 % (ref 3–12)
NEUTROS PCT: 73 % (ref 43–77)
Neutro Abs: 12.4 10*3/uL — ABNORMAL HIGH (ref 1.7–7.7)
PLATELETS: 623 10*3/uL — AB (ref 150–400)
RBC: 3.32 MIL/uL — ABNORMAL LOW (ref 4.22–5.81)
RDW: 15.6 % — ABNORMAL HIGH (ref 11.5–15.5)
WBC: 16.9 10*3/uL — AB (ref 4.0–10.5)

## 2013-10-19 LAB — COMPREHENSIVE METABOLIC PANEL
ALT: 19 U/L (ref 0–53)
AST: 20 U/L (ref 0–37)
Albumin: 3.4 g/dL — ABNORMAL LOW (ref 3.5–5.2)
Alkaline Phosphatase: 220 U/L — ABNORMAL HIGH (ref 39–117)
BILIRUBIN TOTAL: 0.2 mg/dL — AB (ref 0.3–1.2)
BUN: 34 mg/dL — ABNORMAL HIGH (ref 6–23)
CHLORIDE: 102 meq/L (ref 96–112)
CO2: 21 meq/L (ref 19–32)
Calcium: 9.3 mg/dL (ref 8.4–10.5)
Creatinine, Ser: 1.43 mg/dL — ABNORMAL HIGH (ref 0.50–1.35)
GFR calc Af Amer: 76 mL/min — ABNORMAL LOW (ref >90)
GFR, EST NON AFRICAN AMERICAN: 66 mL/min — AB (ref >90)
Glucose, Bld: 161 mg/dL — ABNORMAL HIGH (ref 70–99)
Potassium: 5.2 mEq/L (ref 3.7–5.3)
SODIUM: 139 meq/L (ref 137–147)
Total Protein: 7.9 g/dL (ref 6.0–8.3)

## 2013-10-19 LAB — GLUCOSE, CAPILLARY
GLUCOSE-CAPILLARY: 30 mg/dL — AB (ref 70–99)
GLUCOSE-CAPILLARY: 201 mg/dL — AB (ref 70–99)
Glucose-Capillary: 567 mg/dL (ref 70–99)
Glucose-Capillary: 411 mg/dL — ABNORMAL HIGH (ref 70–99)
Glucose-Capillary: 348 mg/dL — ABNORMAL HIGH (ref 70–99)
Glucose-Capillary: 197 mg/dL — ABNORMAL HIGH (ref 70–99)
Glucose-Capillary: 254 mg/dL — ABNORMAL HIGH (ref 70–99)

## 2013-10-19 LAB — LACTATE DEHYDROGENASE: LDH: 191 U/L (ref 94–250)

## 2013-10-19 LAB — RETICULOCYTES
RBC.: 3.32 MIL/uL — ABNORMAL LOW (ref 4.22–5.81)
RETIC CT PCT: 1.6 % (ref 0.4–3.1)
Retic Count, Absolute: 53.1 10*3/uL (ref 19.0–186.0)

## 2013-10-19 NOTE — Progress Notes (Signed)
Maurice Davidson presented for labwork. Labs per MD order drawn via Peripheral Line 23 gauge needle inserted in right anterior forearm.  Good blood return present. Procedure without incident.  Needle removed intact. Patient tolerated procedure well.
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
## 2013-10-19 NOTE — Patient Instructions (Signed)
[facility] Discharge Instructions  RECOMMENDATIONS MADE BY THE CONSULTANT AND ANY TEST RESULTS WILL BE SENT TO YOUR REFERRING PHYSICIAN.  EXAM FINDINGS BY THE PHYSICIAN TODAY AND SIGNS OR SYMPTOMS TO REPORT TO CLINIC OR PRIMARY PHYSICIAN: Exam and findings as discussed by Dr. Barnet Glasgow.  Return for follow-up appointment with doctor and lab work (CBC) in 3 weeks.   Thank you for choosing [facility] to provide your oncology and hematology care.  To afford each patient quality time with our providers, please arrive at least 15 minutes before your scheduled appointment time.  With your help, our goal is to use those 15 minutes to complete the necessary work-up to ensure our physicians have the information they need to help with your evaluation and healthcare recommendations.    Effective January 1st, 2014, we ask that you re-schedule your appointment with our physicians should you arrive 10 or more minutes late for your appointment.  We strive to give you quality time with our providers, and arriving late affects you and other patients whose appointments are after yours.    Again, thank you for choosing [facility].  Our hope is that these requests will decrease the amount of time that you wait before being seen by our physicians.       _____________________________________________________________  Should you have questions after your visit to [facility], please contact our office at [phone] between the hours of 8:30 a.m. and 5:00 p.m.  Voicemails left after 4:30 p.m. will not be returned until the following business day.  For prescription refill requests, have your pharmacy contact our office with your prescription refill request.

## 2013-10-20 ENCOUNTER — Encounter (HOSPITAL_COMMUNITY): Payer: Self-pay

## 2013-10-20 ENCOUNTER — Non-Acute Institutional Stay (SKILLED_NURSING_FACILITY): Payer: Medicaid Other | Admitting: Internal Medicine

## 2013-10-20 DIAGNOSIS — D75839 Thrombocytosis, unspecified: Secondary | ICD-10-CM

## 2013-10-20 DIAGNOSIS — D473 Essential (hemorrhagic) thrombocythemia: Secondary | ICD-10-CM

## 2013-10-20 DIAGNOSIS — K3189 Other diseases of stomach and duodenum: Secondary | ICD-10-CM

## 2013-10-20 DIAGNOSIS — R109 Unspecified abdominal pain: Secondary | ICD-10-CM | POA: Insufficient documentation

## 2013-10-20 DIAGNOSIS — R1013 Epigastric pain: Secondary | ICD-10-CM

## 2013-10-20 DIAGNOSIS — R197 Diarrhea, unspecified: Secondary | ICD-10-CM

## 2013-10-20 LAB — GLUCOSE, CAPILLARY
GLUCOSE-CAPILLARY: 225 mg/dL — AB (ref 70–99)
Glucose-Capillary: 186 mg/dL — ABNORMAL HIGH (ref 70–99)
Glucose-Capillary: 284 mg/dL — ABNORMAL HIGH (ref 70–99)
Glucose-Capillary: 193 mg/dL — ABNORMAL HIGH (ref 70–99)
Glucose-Capillary: 123 mg/dL — ABNORMAL HIGH (ref 70–99)
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
## 2013-10-20 NOTE — Progress Notes (Signed)
Addendum:  Patient's sister has von Willebrand's disease and polycythemia vera.

## 2013-10-21 LAB — GLUCOSE, CAPILLARY
GLUCOSE-CAPILLARY: 97 mg/dL (ref 70–99)
GLUCOSE-CAPILLARY: 216 mg/dL — AB (ref 70–99)
Glucose-Capillary: 237 mg/dL — ABNORMAL HIGH (ref 70–99)
Glucose-Capillary: 165 mg/dL — ABNORMAL HIGH (ref 70–99)
Glucose-Capillary: 96 mg/dL (ref 70–99)
Glucose-Capillary: 178 mg/dL — ABNORMAL HIGH (ref 70–99)
Glucose-Capillary: 331 mg/dL — ABNORMAL HIGH (ref 70–99)

## 2013-10-22 LAB — P190 BCR-ABL 1: P190 BCR ABL1: NOT DETECTED

## 2013-10-22 LAB — BCR/ABL GENE REARRANGEMENT QNT, PCR
BCR ABL1 / ABL1 IS: 0 %
BCR ABL1 / ABL1: 0 %

## 2013-10-22 LAB — GLUCOSE, CAPILLARY
GLUCOSE-CAPILLARY: 409 mg/dL — AB (ref 70–99)
Glucose-Capillary: 328 mg/dL — ABNORMAL HIGH (ref 70–99)
Glucose-Capillary: 213 mg/dL — ABNORMAL HIGH (ref 70–99)
Glucose-Capillary: 258 mg/dL — ABNORMAL HIGH (ref 70–99)

## 2013-10-22 LAB — P210 BCR-ABL 1: P210 BCR ABL1: NOT DETECTED

## 2013-10-23 ENCOUNTER — Emergency Department: Payer: Self-pay | Admitting: Emergency Medicine

## 2013-10-23 LAB — BASIC METABOLIC PANEL
Anion Gap: 6 — ABNORMAL LOW (ref 7–16)
BUN: 26 mg/dL — AB (ref 7–18)
CALCIUM: 8.8 mg/dL (ref 8.5–10.1)
CREATININE: 1.19 mg/dL (ref 0.60–1.30)
Chloride: 112 mmol/L — ABNORMAL HIGH (ref 98–107)
Co2: 21 mmol/L (ref 21–32)
EGFR (Non-African Amer.): >60
GLUCOSE: 238 mg/dL — AB (ref 65–99)
OSMOLALITY: 290 (ref 275–301)
Potassium: 4.7 mmol/L (ref 3.5–5.1)
Sodium: 139 mmol/L (ref 136–145)

## 2013-10-24 ENCOUNTER — Emergency Department: Payer: Self-pay | Admitting: Emergency Medicine

## 2013-10-24 LAB — BASIC METABOLIC PANEL
Anion Gap: 5 — ABNORMAL LOW (ref 7–16)
BUN: 31 mg/dL — AB (ref 7–18)
CHLORIDE: 116 mmol/L — AB (ref 98–107)
CREATININE: 1.67 mg/dL — AB (ref 0.60–1.30)
Calcium, Total: 8.8 mg/dL (ref 8.5–10.1)
Co2: 19 mmol/L — ABNORMAL LOW (ref 21–32)
EGFR (Non-African Amer.): 55 — ABNORMAL LOW
GLUCOSE: 246 mg/dL — AB (ref 65–99)
OSMOLALITY: 294 (ref 275–301)
POTASSIUM: 4.3 mmol/L (ref 3.5–5.1)
Sodium: 140 mmol/L (ref 136–145)

## 2013-10-25 LAB — JAK2 GENOTYPR: JAK2 GenotypR: NOT DETECTED

## 2013-10-27 ENCOUNTER — Other Ambulatory Visit: Payer: Self-pay

## 2013-11-01 LAB — CULTURE, BLOOD (SINGLE)

## 2013-11-05 ENCOUNTER — Other Ambulatory Visit (HOSPITAL_COMMUNITY): Payer: Self-pay

## 2013-11-05 DIAGNOSIS — D638 Anemia in other chronic diseases classified elsewhere: Secondary | ICD-10-CM

## 2013-11-05 DIAGNOSIS — D473 Essential (hemorrhagic) thrombocythemia: Secondary | ICD-10-CM

## 2013-11-05 DIAGNOSIS — D75839 Thrombocytosis, unspecified: Secondary | ICD-10-CM

## 2013-11-09 ENCOUNTER — Other Ambulatory Visit (HOSPITAL_COMMUNITY): Payer: Medicaid Other

## 2013-11-09 ENCOUNTER — Ambulatory Visit (HOSPITAL_COMMUNITY): Payer: Medicaid Other

## 2013-11-15 ENCOUNTER — Other Ambulatory Visit (HOSPITAL_COMMUNITY): Payer: Medicaid Other

## 2013-11-15 ENCOUNTER — Ambulatory Visit (HOSPITAL_COMMUNITY): Payer: Medicaid Other

## 2013-11-19 ENCOUNTER — Inpatient Hospital Stay: Payer: Self-pay | Admitting: Internal Medicine

## 2013-11-19 LAB — CBC
HCT: 39.1 % — ABNORMAL LOW (ref 40.0–52.0)
HGB: 13 g/dL (ref 13.0–18.0)
MCH: 29 pg (ref 26.0–34.0)
MCHC: 33.2 g/dL (ref 32.0–36.0)
MCV: 87 fL (ref 80–100)
Platelet: 560 10*3/uL — ABNORMAL HIGH (ref 150–440)
RBC: 4.48 10*6/uL (ref 4.40–5.90)
RDW: 17.1 % — ABNORMAL HIGH (ref 11.5–14.5)
WBC: 30.6 10*3/uL — AB (ref 3.8–10.6)

## 2013-11-19 LAB — URINALYSIS, COMPLETE
BILIRUBIN, UR: NEGATIVE
Bacteria: NONE SEEN
Leukocyte Esterase: NEGATIVE
Nitrite: NEGATIVE
PH: 5 (ref 4.5–8.0)
Protein: 100
RBC,UR: 4 /HPF (ref 0–5)
SPECIFIC GRAVITY: 1.008 (ref 1.003–1.030)
Squamous Epithelial: NONE SEEN
WBC UR: 3 /HPF (ref 0–5)

## 2013-11-19 LAB — DRUG SCREEN, URINE
Amphetamines, Ur Screen: NEGATIVE (ref ?–1000)
BARBITURATES, UR SCREEN: NEGATIVE (ref ?–200)
BENZODIAZEPINE, UR SCRN: NEGATIVE (ref ?–200)
Cannabinoid 50 Ng, Ur ~~LOC~~: POSITIVE (ref ?–50)
Cocaine Metabolite,Ur ~~LOC~~: NEGATIVE (ref ?–300)
MDMA (ECSTASY) UR SCREEN: NEGATIVE (ref ?–500)
Methadone, Ur Screen: NEGATIVE (ref ?–300)
Opiate, Ur Screen: NEGATIVE (ref ?–300)
Phencyclidine (PCP) Ur S: NEGATIVE (ref ?–25)
Tricyclic, Ur Screen: NEGATIVE (ref ?–1000)

## 2013-11-19 LAB — OCCULT BLOOD X 1 CARD TO LAB, STOOL: Occult Blood, Feces: POSITIVE

## 2013-11-19 LAB — COMPREHENSIVE METABOLIC PANEL
ALK PHOS: 259 U/L — AB
ANION GAP: 14 (ref 7–16)
Albumin: 3.4 g/dL (ref 3.4–5.0)
BUN: 47 mg/dL — ABNORMAL HIGH (ref 7–18)
Bilirubin,Total: 1 mg/dL (ref 0.2–1.0)
CO2: 24 mmol/L (ref 21–32)
Calcium, Total: 9.1 mg/dL (ref 8.5–10.1)
Chloride: 88 mmol/L — ABNORMAL LOW (ref 98–107)
Creatinine: 1.66 mg/dL — ABNORMAL HIGH (ref 0.60–1.30)
EGFR (African American): >60
EGFR (Non-African Amer.): 55 — ABNORMAL LOW
Glucose: 368 mg/dL — ABNORMAL HIGH (ref 65–99)
OSMOLALITY: 281 (ref 275–301)
Potassium: 3.2 mmol/L — ABNORMAL LOW (ref 3.5–5.1)
SGOT(AST): 6 U/L — ABNORMAL LOW (ref 15–37)
SGPT (ALT): 30 U/L (ref 12–78)
Sodium: 126 mmol/L — ABNORMAL LOW (ref 136–145)
TOTAL PROTEIN: 7.7 g/dL (ref 6.4–8.2)

## 2013-11-19 LAB — LIPASE, BLOOD: Lipase: 93 U/L (ref 73–393)

## 2013-11-19 LAB — CLOSTRIDIUM DIFFICILE(ARMC)

## 2013-11-20 LAB — COMPREHENSIVE METABOLIC PANEL
ALK PHOS: 195 U/L — AB
ANION GAP: 6 — AB (ref 7–16)
Albumin: 2.9 g/dL — ABNORMAL LOW (ref 3.4–5.0)
BUN: 27 mg/dL — ABNORMAL HIGH (ref 7–18)
Bilirubin,Total: 0.5 mg/dL (ref 0.2–1.0)
CALCIUM: 8.7 mg/dL (ref 8.5–10.1)
Chloride: 101 mmol/L (ref 98–107)
Co2: 28 mmol/L (ref 21–32)
Creatinine: 1.23 mg/dL (ref 0.60–1.30)
Glucose: 228 mg/dL — ABNORMAL HIGH (ref 65–99)
OSMOLALITY: 282 (ref 275–301)
Potassium: 3.2 mmol/L — ABNORMAL LOW (ref 3.5–5.1)
SGOT(AST): <5 U/L — ABNORMAL LOW (ref 15–37)
SGPT (ALT): 21 U/L (ref 12–78)
Sodium: 135 mmol/L — ABNORMAL LOW (ref 136–145)
TOTAL PROTEIN: 6.5 g/dL (ref 6.4–8.2)

## 2013-11-20 LAB — CBC WITH DIFFERENTIAL/PLATELET
BASOS ABS: 0 10*3/uL (ref 0.0–0.1)
BASOS PCT: 0.2 %
EOS ABS: 0 10*3/uL (ref 0.0–0.7)
Eosinophil %: 0.1 %
HCT: 33.6 % — ABNORMAL LOW (ref 40.0–52.0)
HGB: 10.8 g/dL — ABNORMAL LOW (ref 13.0–18.0)
LYMPHS ABS: 1.6 10*3/uL (ref 1.0–3.6)
LYMPHS PCT: 7.9 %
MCH: 27.8 pg (ref 26.0–34.0)
MCHC: 32.1 g/dL (ref 32.0–36.0)
MCV: 87 fL (ref 80–100)
MONOS PCT: 8.2 %
Monocyte #: 1.7 x10 3/mm — ABNORMAL HIGH (ref 0.2–1.0)
NEUTROS ABS: 16.8 10*3/uL — AB (ref 1.4–6.5)
NEUTROS PCT: 83.6 %
Platelet: 478 10*3/uL — ABNORMAL HIGH (ref 150–440)
RBC: 3.88 10*6/uL — ABNORMAL LOW (ref 4.40–5.90)
RDW: 16.8 % — AB (ref 11.5–14.5)
WBC: 20.2 10*3/uL — AB (ref 3.8–10.6)

## 2013-11-20 LAB — MAGNESIUM: MAGNESIUM: 1.5 mg/dL — AB

## 2013-11-21 LAB — WBC: WBC: 8.5 10*3/uL (ref 3.8–10.6)

## 2013-11-21 LAB — MAGNESIUM: MAGNESIUM: 1.9 mg/dL

## 2013-11-21 LAB — POTASSIUM: Potassium: 3.5 mmol/L (ref 3.5–5.1)

## 2013-11-22 LAB — STOOL CULTURE

## 2013-11-22 LAB — WBCS, STOOL

## 2013-11-23 ENCOUNTER — Encounter (HOSPITAL_COMMUNITY): Payer: Medicaid Other

## 2013-11-23 ENCOUNTER — Encounter (HOSPITAL_COMMUNITY): Payer: Medicaid Other | Attending: Hematology and Oncology

## 2013-11-23 ENCOUNTER — Encounter (HOSPITAL_COMMUNITY): Payer: Self-pay

## 2013-11-23 VITALS — BP 72/39 | HR 98 | Temp 97.0°F | Resp 18 | Wt 101.8 lb

## 2013-11-23 DIAGNOSIS — D638 Anemia in other chronic diseases classified elsewhere: Secondary | ICD-10-CM | POA: Diagnosis not present

## 2013-11-23 DIAGNOSIS — E109 Type 1 diabetes mellitus without complications: Secondary | ICD-10-CM | POA: Diagnosis not present

## 2013-11-23 DIAGNOSIS — D473 Essential (hemorrhagic) thrombocythemia: Secondary | ICD-10-CM | POA: Diagnosis not present

## 2013-11-23 DIAGNOSIS — D75839 Thrombocytosis, unspecified: Secondary | ICD-10-CM

## 2013-11-23 LAB — CBC WITH DIFFERENTIAL/PLATELET
BASOS ABS: 0 10*3/uL (ref 0.0–0.1)
Basophils Relative: 0 % (ref 0–1)
EOS PCT: 5 % (ref 0–5)
Eosinophils Absolute: 0.7 10*3/uL (ref 0.0–0.7)
HCT: 26.5 % — ABNORMAL LOW (ref 39.0–52.0)
HEMOGLOBIN: 8.3 g/dL — AB (ref 13.0–17.0)
LYMPHS ABS: 2.7 10*3/uL (ref 0.7–4.0)
LYMPHS PCT: 19 % (ref 12–46)
MCH: 29.2 pg (ref 26.0–34.0)
MCHC: 31.3 g/dL (ref 30.0–36.0)
MCV: 93.3 fL (ref 78.0–100.0)
MONOS PCT: 6 % (ref 3–12)
Monocytes Absolute: 0.9 10*3/uL (ref 0.1–1.0)
NEUTROS ABS: 9.9 10*3/uL — AB (ref 1.7–7.7)
Neutrophils Relative %: 70 % (ref 43–77)
Platelets: 491 10*3/uL — ABNORMAL HIGH (ref 150–400)
RBC: 2.84 MIL/uL — ABNORMAL LOW (ref 4.22–5.81)
RDW: 18 % — ABNORMAL HIGH (ref 11.5–15.5)
WBC: 14.2 10*3/uL — AB (ref 4.0–10.5)

## 2013-11-23 LAB — URINE CULTURE

## 2013-11-23 NOTE — Patient Instructions (Signed)
[place] Discharge Instructions  RECOMMENDATIONS MADE BY THE CONSULTANT AND ANY TEST RESULTS WILL BE SENT TO YOUR REFERRING PHYSICIAN.  Return as needed to the clinic.    Thank you for choosing [facility] to provide your oncology and hematology care.  To afford each patient quality time with our providers, please arrive at least 15 minutes before your scheduled appointment time.  With your help, our goal is to use those 15 minutes to complete the necessary work-up to ensure our physicians have the information they need to help with your evaluation and healthcare recommendations.    Effective January 1st, 2014, we ask that you re-schedule your appointment with our physicians should you arrive 10 or more minutes late for your appointment.  We strive to give you quality time with our providers, and arriving late affects you and other patients whose appointments are after yours.    Again, thank you for choosing [facility].  Our hope is that these requests will decrease the amount of time that you wait before being seen by our physicians.       _____________________________________________________________  Should you have questions after your visit to [facility], please contact our office at [phone] between the hours of 8:30 a.m. and 5:00 p.m.  Voicemails left after 4:30 p.m. will not be returned until the following business day.  For prescription refill requests, have your pharmacy contact our office with your prescription refill request.
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
## 2013-11-24 LAB — CULTURE, BLOOD (SINGLE)

## 2014-04-06 ENCOUNTER — Emergency Department: Payer: Self-pay | Admitting: Emergency Medicine

## 2015-01-06 NOTE — Consult Note (Signed)
PATIENT NAME:  Maurice Davidson, Maurice Davidson MR#:  [id number] DATE OF BIRTH:  [birth year]  DATE OF CONSULTATION:  10/19/2012  REFERRING PHYSICIAN:   CONSULTING PHYSICIAN:  Simonne Boulos K. Dicky Boer, MD  SUBJECTIVE:  Patient was seen for followup consultation.  The patient is a 29 year old white male who is not employed and is on disability and lives with his parents and his sister who is one year younger than him.  The patient was brought to [facility] for "seizures".  Chief complaint today, "I want to go home; I have not had a seizure." The patient is a poor historian.  According to information from staff, who are in touch the parents, he has been abusing all sorts of medications, which include opioids and benzodiazepines, and in fact he crushes the lisinopril and snorts it and they want him to go through a substance abuse program and they do not want him to come back home.  However, the patient is a poor historian and he had reported that he had been to a Suboxone clinic in [place] and the last appointment was 4 months ago and reports that he gets Suboxone strips from his uncle from the streets.  In addition, he reports that he used to get opioids from the streets and he was very vague about the history.  He reports that he has been smoking THC from the bowl about 3 times a day for a couple of years or more.  He reports that he did use cocaine in the past.  He does admit to drinking alcohol, but only on special occasions, which happened once in 6 months.  He does admit smoking nicotine cigarettes at the rate of a pack a day for many years.    PAST PSYCHIATRIC HISTORY:  The patient denies any previous history of inpatient psychiatry. He denies any suicide attempt.  He denies being followed by a psychiatrist, except that he was in methadone clinic and was not clear about the dates and he was also in a Suboxone clinic  and reports that last appointment was 4 months ago, but history is questionable.    MENTAL STATUS:  The patient is  dressed in hospital clothes.  He is sleepy, but is arousable today.  He knew that he was in the hospital.  He stated this is February of 2013.  He did not know the exact date.  He knew the capital of [place], capital of [place] and he knew the current president.  He denies feeling depressed, but he admits that he is feeling hopeless and helpless that he cannot see the family and go back home, though he has not had any seizures.  He denies feeling worthless or useless.  He denies any ideas or plans to hurt himself or others.  He denies any paranoid ideas.  He denies any auditory hallucinations.  He could spell the word "world" forward, but could not spell it backwards because he became sleepy and with poor concentration and focus.  Further mental status could not be done.  Insight and judgment guarded.  IMPRESSION: Polysubstance abuse - dependence with probably withdrawal seizure.    RECOMMENDATIONS:  Ativan 1 mg p.o. q. 4 hours, p.r.n. for agitation and watch him.  Continue helping him with withdrawal from polysubstance abuse.  We will re-evaluate him tomorrow. That is 10/20/2012 for mental status.  I tried to discuss with patient about value of substance abuse programs, but he does not appear to have any interest in this at this time  and re-evaluation is necessary for appropriate placement and disposition and future planning for his substance abuse problems.        ____________________________ Wallace Cullens. Franchot Mimes, MD skc:cc D: 10/19/2012 15:14:06 ET T: 10/19/2012 21:40:49 ET JOB#: [id number]  cc: Arlyn Leak K. Franchot Mimes, MD, <Dictator> Dewain Penning MD ELECTRONICALLY SIGNED 10/20/2012 14:01
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
## 2015-01-06 NOTE — Discharge Summary (Signed)
PATIENT NAME:  Maurice Davidson, Maurice Davidson MR#:  [id number] DATE OF BIRTH:  March 30, 1986  DATE OF ADMISSION:  12/10/2012 DATE OF DISCHARGE:  12/12/2012  DISCHARGE DIAGNOSES: 1.  Sepsis due to urinary tract infection on admission, corrected.  2.  Acute renal failure, correcting.  3.  Hypertension, orthostatic, secondary to dehydration and urinary tract infection.  4.  Nausea and vomiting.  5.  Hyponatremia.  6.  Acute on chronic femur fracture on right side.  7.  Drug abuse.   CONDITION ON DISCHARGE:  Stable.   CODE STATUS ON DISCHARGE:  FULL CODE.   MEDICATIONS ON DISCHARGE:  Regular insulin sliding scale, fingerstick done before meals, insulin glargine 8 units subcutaneous once a day, Paxil 10 mg oral tablet once a day,, Xanax 1 mg oral tablet 3 times a day as needed for agitation, Ceftin 500 mg oral tablet 2 times a day for five days, nicotine patch once a day.   DIET ON DISCHARGE:  Regular.    DIET CONSISTENCY:  Regular.   ACTIVITY LIMITATION:  As tolerated.   TIMEFRAME TO FOLLOW-UP:  Within 1 to 2 weeks with Dr. Earnestine Leys and with Dr. Candiss Norse within 1 to 2 weeks.  Need to check kidney function in 1 to 2 weeks with Dr. Candiss Norse.  HISTORY OF PRESENT ILLNESS:  The patient is a 29 year old male with significant past medical history of insulin-dependent diabetes mellitus since age 81, gastroparesis, neuropathy, depression, history of polysubstance abuse, presented with weakness and dizziness.  He reported that he has been having vomiting over the last few days with reduced by mouth intake and on an average 2 to 3 episodes of vomiting every day.  He also has been feeling dizzy and weak which prompted him to come to Emergency Room.  In Emergency Room his blood pressure found having 44/22 and heart rate was 102.  He was hypothermic at 94.9 and he received 2 liters of fluid bolus.  His blood pressure improved after that.   Acute renal failure with creatinine of 5.71 and BUN of 92, sodium of 128.  The patient  denied any shortness of breath, any dyspnea, polyuria or cough productive of sputum.  He had leukocytosis of 15,000.  Bladder scan done in the Emergency Room showed 0 mL urine and so urinalysis could not be done due to that.   HOSPITAL COURSE AND STAY:  Acute renal failure.  Initially it was thought possibly due to severe dehydration as he had acute orthostatic hypotension.  He was given IV fluids and boluses and then he had gradual and steady recovery of his renal function.  He started having urine output after several hours and that was sent for urinalysis, found having positive for urine infection and so we assumed that all the episode was septic shock due to urinary tract infection and he was dehydrated secondary to urine infection.  He was started on treatment with IV antibiotics and had good response.  Overall kidney function improved.  He felt better.  Blood pressure remained stable and so he was discharged with oral medications to finish his antibiotic course.   OTHER MEDICAL ISSUES:  1.  Septic shock, as mentioned above, due to urinary tract infection.  2.  Dehydration, as mentioned above, due to urinary tract infection, treated with IV fluids and improved.  3.  Hyponatremia, due to volume depletion, IV fluids given and sodium level was corrected.  4.  Dizziness, due to orthostatic hypotension due to hypovolemia and dehydration and septic shock, corrected  with IV fluid and treatment of urinary tract infection.  5.  Nausea and vomiting.  Initially thought may be gastroparesis, but after having urine infection confirmed, it was due to urine infection and corrected with treatment of urinary tract infection.  6.  History of polysubstance abuse.  He had urine positive for substance, but then he agreed for stopping all of them.  7.  Tobacco abuse, still smoking.  Counseling was done and started on nicotine patch, discharged with the same.  8.  Hypertension.  He was taking lisinopril for his  hypertension which was held due to renal failure and hypotension in hospital course.  On discharge he was discharged without any medication as blood pressure was stable.  9.  Depression and anxiety.  Continue with his home medication.  10.  Right knee swelling.  X-ray of the knee was done which showed some acute on chronic fracture of right femur.  CT of the knee was also done to see it in detail.  Dr. Earnestine Leys was involved from orthopedic team for his care and he suggested no further work-up, gave him supportive care and physical therapy, walking with crutches and advised to follow in his clinic after two weeks with pain management.   IMPORTANT LABORATORY RESULTS IN THE HOSPITAL:  On presentation, creatinine was 5.71 which came down to 1.99.  Sodium was 128, came up to 130.  Urine on presentation was positive for benzodiazepines, opiates and cannabinoids.  Total WBC count was 15,000, which came down to 12,000 after treatment.  Clostridium difficile for stool was negative.  Urinalysis was positive with 834 WBCs in the urine.   TOTAL TIME SPENT IN THIS DISCHARGE:  45 minutes.     ____________________________ Ceasar Lund Anselm Jungling, MD vgv:ea D: 12/15/2012 22:01:36 ET T: 12/16/2012 01:13:02 ET JOB#: [id number]  cc: Ceasar Lund. Anselm Jungling, MD, <Dictator> Park Breed, MD [facility] Murlean Iba, MD Vaughan Basta MD ELECTRONICALLY SIGNED 12/24/2012 14:29

## 2015-01-06 NOTE — Consult Note (Signed)
PATIENT NAME:  Maurice Davidson, Maurice Davidson MR#:  [id number] DATE OF BIRTH:  02/23/1986  DATE OF CONSULTATION:  10/20/2012  CONSULTING PHYSICIAN:  Aquila Delaughter K. Nikiyah Fackler, MD  SUBJECTIVE: The patient was seen in follow-up consult today.  He is still on one-on-one observation, and the staff report that he is doing much better.    OBJECTIVE: The patient is alert and oriented.  Today he knew the day and date, and he knew that he was in [place] and [facility].  He knew the cap[place] of [place], [place] of the [place], name of the current Software engineer and said that he voted for him. He realized the reason why he was admitted to the hospital.  He reports that he mixed up Percocet which he got from his uncle with cocaine, which he got from his friend and he snorted.  His behavior became bizarre, and so his sister and a friend thought that he needed help and he was here.  Today, he denies feeling depressed, denies feeling hopeless or helpless.  He denies any ideas or plans to hurt himself.  No psychosis.  He does not appear to be responding to internal stimuli.  He reports that he is eager to go home and get help as needed for his substance abuse  problems.  Insight and judgment are guarded, and it is doubtful how much help he is going to get from substance abuse, though he is agreeable to get help at this time.    IMPRESSION:  Polysubstance abuse dependence-Percocet, cocaine, THC, and nicotine.   RECOMMENDATIONS:  Continue current treatment of p.r.n. Ativan.  Discontinue one-on-one observation as the patient contracts for safety and denies any ideas or plans to hurt himself or others.  I requested Social Services to come evaluate the patient for appropriate program in the community for his substance abuse.   ____________________________ Wallace Cullens. Franchot Mimes, MD skc:cb D: 10/20/2012 13:57:01 ET T: 10/20/2012 14:04:20 ET JOB#: [id number]  cc: Arlyn Leak K. Franchot Mimes, MD, <Dictator> Dewain Penning MD ELECTRONICALLY SIGNED 10/23/2012  15:10
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
## 2015-01-06 NOTE — Consult Note (Signed)
PATIENT NAME:  Maurice Davidson, Maurice Davidson MR#:  [id number] DATE OF BIRTH:  June 23, 1986  DATE OF CONSULTATION:  10/18/2012  REFERRING PHYSICIAN:   CONSULTING PHYSICIAN:  Leotis Pain, MD  REASON FOR EVALUATION: Seizures.   HISTORY OF PRESENT ILLNESS: History was obtained from the patient's chart as the patient appears to be somnolent and resistant to follow commands and does not want to tell history of presenting illness. This is a 29 year old male with a history of type 1 diabetes since the age of 62, polysubstance abuse, positive U-tox for marijuana, cocaine and benzos, also history of opiate use, as well as depression. He has a history nephrolithiasis. Admitted from home for suspected seizure activity and was found to have myoclonic jerks. At that time, the patient was noted to fall. No clear history of urinary incontinence, tongue biting. The patient was also found to have worsening renal function. His baseline creatinine is around 1 which was about a year ago. Currently presents with 4.87. The patient is currently in isolation because of MRSA in the urine. The patient has also a history of kidney stones. CT head was obtained on initial admission. No acute intracranial pathology was noted.   PAST MEDICAL HISTORY: As above, includes type 1 diabetes, nephrolithiasis, polysubstance abuse, kidney stones and MRSA in the urine.   REVIEW OF SYSTEMS: Hard to obtain but as per chart no shortness breath, no cough, no chest pain, no urinary incontinence, no fecal incontinence. Positive history of depression.   LABORATORY: Evaluation reviewed. Elevated white blood cell count of 14.9, possibly due to seizures, although hemoglobin and hematocrit are 11.5 and 36.7. His hemoglobin A1c is elevated at 10.2. PTT is elevated as well. U-tox is positive for polysubstance use as stated above.   PHYSICAL EXAMINATION:  VITAL SIGNS: Temperature is 96.8, pulse 109, respirations 22, blood pressure 116/76, pulse ox is 96.   NEUROLOGIC: The patient is sedated status post Ativan for agitation. Basic cranial nerve examination: Visual fields appear to be intact to visual threats. No facial asymmetry noted. Motor strength: The patient withdraws from painful stimuli in all of his extremities. Sensation: It is difficult to assess but I suspect no sensory abnormalities. Reflexes 2+, symmetrical. Gait: The patient was witnessed to ambulate on his own without assistance, but I have not been able to witness that.   IMAGING: CT as above showed no intracranial pathology. The patient is status post ultrasound of his kidneys bilaterally. No structural abnormalities. Status post chest x-ray, and chest x-ray did not show any significant abnormalities either.   IMPRESSION: A 29 year old male with polysubstance abuse comes in status post fall, myoclonic jerks suspected of seizure activity, status post load of fosphenytoin. The patient is also a known type 1 diabetic and he appears to be a polysubstance abuser, positive cocaine, benzodiazepines and marijuana.   PLAN: At this point, would not continue any further antiepileptic medication as suspect that seizure activity was provoked with the use of multiple substances as he has a chronic history of polysubstance abuse. He needs counseling for his polysubstance abuse, possibility of psychiatric evaluation which does not have to be in the hospital. If the patient does spike a temp and mental status changes, would recommend a lumbar puncture to rule out meningitis or other infectious pathology due to history of possible immunosuppression with chronic substance abuse. At this point, do not think the patient needs an EEG as we suspect this was seizure activity but seizure was provoked. P.r.n. Ativan for agitation and from withdrawal of  his polysubstance abuse, specifically benzodiazepines and opiates. Hydration. The patient should be followed up by renal team and recheck his BMP for his creatinine  level. At this point, do not suspect the patient has any further seizures as the patient was noted to be awake and conversed. No further neurological intervention this time. If the patient does have another seizure, at that point would start him on Dilantin of 100 mg 3 times a day. As the patient was loaded, will check a Dilantin free and total level the day after. Would also obtain an MRI of the brain.   Thank you. It was a pleasure seeing this patient. Please do not hesitate to call with any questions.     ____________________________ Leotis Pain, MD yz:gb D: 10/18/2012 13:11:17 ET T: 10/18/2012 22:15:43 ET JOB#: [id number]  cc: Leotis Pain, MD, <Dictator> Leotis Pain MD ELECTRONICALLY SIGNED 10/25/2012 18:10

## 2015-01-06 NOTE — Discharge Summary (Signed)
PATIENT NAME:  Maurice Davidson, Maurice Davidson MR#:  [id number] DATE OF BIRTH:  [birth year]  DATE OF ADMISSION:  10/18/2012 DATE OF DISCHARGE:  10/21/2012  ADMITTING PHYSICIAN:  Nicholes Mango, MD  DISCHARGING PHYSICIAN:  Gladstone Lighter, MD  PRIMARY CARE PHYSICIAN:  Myrle Sheng. Jimmye Norman, MD, at [facility].  [place]:  1.  Nephrology consultation by Dr. Candiss Norse.  2.  Neurology consultation by Dr. Irish Elders.  3.  Psychiatry consultation by Dr. Franchot Mimes.   DISCHARGE DIAGNOSES:   1.  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  2.  Insulin-dependent diabetes mellitus.  3.  Hypertension.  4.  Peripheral neuropathy.  5.  Depression and anxiety.  6.  Acute renal failure on admission.  7.  Tobacco use disorder.   DISCHARGE HOME MEDICATIONS:   1.  Sliding scale insulin before meals.  2.  Benadryl 25 mg at bedtime as needed for sleep.  3.  Lisinopril 20 mg p.o. daily.  4.  Paxil 10 mg p.o. daily.  5.  Reglan 5 mg 4 times a day before meals.  6.  Gabapentin 300 mg p.o. t.i.d.  7.  Phenergan 25 mg rectally every 6 hours as needed for nausea and vomiting.  8.  Glargine insulin 8 units subcutaneously daily.  9.  Clonidine 0.1 mg b.i.d. for 2 days and then daily for 3 more days and stop.   DISCHARGE DIET:  Low sodium, ADA diet.   DISCHARGE ACTIVITY:  As tolerated.   FOLLOWUP INSTRUCTIONS:   1.  Outpatient rehab/substance abuse rehab. Followup appointment made at [facility] for October 22, 2012.   2.  PCP followup in 1 week.   LABS AND IMAGING STUDIES:  WBC was 15.8, hemoglobin 11.2, hematocrit 36.5, platelet count 302. Sodium was 136, potassium 4.3, chloride 101, bicarbonate 29, BUN 16, creatinine 0.84, glucose 286 and calcium 8.3. ALT was 15, AST 15, alkaline phosphatase 115, total bilirubin 2.3, albumin 2.6. LDL was 46, HDL 30, total cholesterol 153 and triglycerides 358. Stool for C. difficile was negative. Complement levels were within normal limits. Hepatitis C antibody was negative.  Ultrasound of kidneys bilaterally showed normal renal ultrasound. Urine tox on admission was positive for cocaine, opioids, cannabinoids and benzos. Urinalysis was negative for infection. Blood cultures were negative. His creatinine on admission was 4.87.   BRIEF HOSPITAL COURSE:  The patient is a young 29 year old Caucasian male with a past medical history significant for type 1 diabetes, hypertension and chronic polysubstance abuse who was on a methadone program in the past presents to the hospital after he was noticed to have myoclonic jerks and had a generalized seizure witnessed in the ER.  1.  Seizure, probably from polysubstance abuse. According to psych interview, he did take marijuana, cocaine, opioids and also benzos. He was strongly counseled while he was in the hospital. He has not had further seizures here. He was placed on withdrawal protocol. He did go through a period of withdrawal while he was here. The psychiatrist recommended inpatient rehab versus outpatient rehab and patient wanted to go to the outpatient rehab so an appointment was made at [facility] for October 22, 2012 in the morning.  2.  Acute renal failure with creatinine of 4.87 and BUN of 57, likely prerenal dehydration. He was seen by nephrology in the hospital. Renal ultrasound was normal. After IV fluids, his renal function has improved.  3.  Type 1 diabetes mellitus. He did not eat much while in the hospital. Insulin was held. He is restarted back on his  home doses of insulin and also sliding scale and advised close outpatient followup.  4.  Hypertension. He is on lisinopril. The clonidine was being every 6 hours for withdrawal protocol while in the hospital so it is being tapered off as an outpatient to prevent rebound hypertension.  5.  All his other home medications were continued for his neuropathy and gastroparesis. His course has been otherwise uneventful in the hospital.   DISCHARGE CONDITION:  Stable.   DISCHARGE  DISPOSITION:  Home.   TIME SPENT ON DISCHARGE:  45 minutes.    ____________________________ Gladstone Lighter, MD rk:si D: 10/21/2012 16:21:00 ET T: 10/22/2012 00:11:01 ET JOB#: [id number]  cc: Gladstone Lighter, MD, <Dictator> Gladstone Lighter MD ELECTRONICALLY SIGNED 10/22/2012 19:49
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
## 2015-01-06 NOTE — Discharge Summary (Signed)
PATIENT NAME:  Maurice Davidson, Maurice Davidson MR#:  [id number] DATE OF BIRTH:  [birth year]  DATE OF ADMISSION:  12/10/2012 DATE OF DISCHARGE:  12/12/2012  NO DICTATION   ____________________________ Ceasar Lund. Anselm Jungling, MD vgv:cc D: 12/15/2012 22:01:51 ET T: 12/15/2012 23:56:52 ET JOB#: [id number]  cc: Ceasar Lund. Anselm Jungling, MD, <Dictator> Vaughan Basta MD ELECTRONICALLY SIGNED 12/24/2012 14:29
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
## 2015-01-07 NOTE — Consult Note (Signed)
Referring Physician:  Vaughan Basta :   Primary Care Physician:  Nonlocal MD, MD :   Reason for Consult: Admit Date: 21-Sep-2013  Chief Complaint: altered mental status / seizure  Reason for Consult: altered mental status   History of Present Illness: History of Present Illness:   A 29 year old male with a past history of insulin-dependent diabetes, neuropathy, gastroparesis, polysubstance abuse, depression, chronic history of anemia who lives with family,.  the last 2 or 3 days he has not been feeling good and as per the mother, complains that he is having some diarrhea which is black-colored and the patient is remaining a little more sleepy also. there was concern for drugs but found to have DKA due to missing insulin. was being treated for DKA and developed seizure during hospitalization. He also had acute on chronic renal failure. Blood and sputum cultures + for MRSA. extubated on 09/27/2013. pt had his first generalized seizure this morning and code blue was called. Pt has previous h/o seizure disorder with hypoglycemic episodes. MEDICAL HISTORY:  Insulin-dependent diabetes mellitus.  Neuropathy.  Gastroparesis.  Polysubstance abuse. Used to be in methadone program.  Depression.  Chronic history of anemia.  Chronic renal failure.  SURGICAL HISTORY:  and colonoscopy for gastroparesis.  HISTORY: Significant for diabetes in grandparents. Mother has osteoporosis.  HISTORY: Lives at home with parents. 1 to 1/2 half pack per day. alcohol and mother does not know about him using any drugs currently.   ROS:  Review of Systems   unobtainable  Past Medical/Surgical Hx:  Multi-drug Resistant Organism (MDRO): Positive culture for MRSA.  Multi-drug Resistant Organism (MDRO): Positive culture for MRSA.  Drug induced seizures:   Multi-drug Resistant Organism (MDRO): Positive culture for MRSA.  substance sbuse:   Kidney stones:   Hypertension:   Neuropathy:   Diabetes Mellitus,Type I  (IDD):   Gastroparesis:   Home Medications: Medication Instructions Last Modified Date/Time  lisinopril 5 mg oral tablet 1 tab(s) orally once a day 06-Jan-15 14:13  metoclopramide 10 mg oral tablet 1 tab(s) orally 3 times a day 06-Jan-15 14:13  gabapentin 300 mg oral capsule 1 cap(s) orally 4 times a day 06-Jan-15 14:13  PARoxetine 40 mg oral tablet 1 tab(s) orally once a day 06-Jan-15 14:13  pantoprazole 40 mg oral delayed release tablet 1 tab(s) orally once a day 06-Jan-15 14:13  Lantus 100 units/mL subcutaneous solution 12 unit(s) subcutaneous once a day (at bedtime) 06-Jan-15 14:13  HumaLOG 100 units/mL subcutaneous solution  subcutaneous sliding scale  06-Jan-15 14:13   Allergies:  No Known Allergies:   Vital Signs: **Vital Signs.:   14-Jan-15 14:00  Vital Signs Type Routine  Temperature Temperature (F) 100.4  Celsius 38  Temperature Source oral  Pulse Pulse 110  Respirations Respirations 20  Systolic BP Systolic BP 034  Diastolic BP (mmHg) Diastolic BP (mmHg) 84  Mean BP 106  Pulse Ox % Pulse Ox % 91  Pulse Ox Activity Level  At rest  Oxygen Delivery 2L   EXAM: young cachectic male IV lines, poor skin  Heart - s1, s2 heart sounds, lungs - decreased air entry, breathing heavy belly soft carotids no bruit  Neurological exam Mental status - drowsy but arousable, follows ones step commands but can't finish sentences, doesn't make sense, did not repond to orientation questions.  - can't check higher cortical functions (e.g. language, memory, attention etc)  - pupils minimally reactive, minimal extra-occular movements, no response to visal threat, can't do fundoscopic exam, face symmetric,  can't check  hearing reliablly can't check tongue position/protrusion reliabllay  Motor/sensory generalized hypotonia, moved all 4 extremities to command but doesn't participate in individual muscle testing  reflexes - symmetric, reduced  can't check gait, co-ordination or  other parts of neuro exam due to mental staus and generalized weakness.  Lab Results:  LabObservation:  13-Jan-15 20:29   OBSERVATION Reason for Test  Hepatic:  11-Jan-15 05:29   Bilirubin, Total 0.4  Alkaline Phosphatase  823 (45-117 NOTE: New Reference Range 08/06/13)  SGPT (ALT) 30  SGOT (AST) 32  Total Protein, Serum  5.3  Albumin, Serum  1.2  TDMs:  14-Jan-15 09:50   Vancomycin, Trough LAB 12 (Result(s) reported on 29 Sep 2013 at 10:22AM.)  LabUnknown:  07-Jan-15 09:38   Ind. Clindamycin Resistance POSITIVE  Routine Micro:  06-Jan-15 16:26   Culture Comment . -  07-Jan-15 09:38   Organism Name Central.AUREUS  Organism Quantity HEAVY GROWTH  Organism 1 HEAVY GROWTH METHICILLIN RESISTANT STAPH.AUREUS  Clindamycin Sensitivity R  Oxacillin Sensitivity R  Ciprofloxacin Sensitivity R  Gentamicin Sensitivity S  Erythromycin Sensitivity R  Vancomycin Sensitivity S  Levofloxacin Sensitivity R  Linezolid Sensitivity S  Tigecycline Sensitivity S  Trimethoprim/Sulfamethoxazole Sensitivty S  Cefoxitin Scrn. POSITIVE  Gram Stain 1 MODERATE WHITE BLOOD CELLS  Gram Stain 2 MODERATE GRAM POSITIVE COCCI IN CLUSTERS  Gram Stain 3 RARE GRAM NEGATIVE ROD  Gram Stain 4 EXCELLENT SPECIMEN-90-100% WBC  13-Jan-15 09:38   Specimen Source 1st rt arm    16:52   Culture Comment NO GROWTH IN 8-12 HOURS  Result(s) reported on 29 Sep 2013 at 01:00AM.  14-Jan-15 12:06   Micro Text Report CLOSTRIDIUM DIFFICILE   C.DIFFICILE ANTIGEN       C.DIFFICILE GDH ANTIGEN : NEGATIVE   C.DIFFICILE TOXIN A/B     C.DIFFICILE TOXINS A AND B : NEGATIVE   INTERPRETATION            Negative for C. difficile.    ANTIBIOTIC                        Routine Chem:  09-Jan-15 04:12   Triglycerides, Serum  309 (Result(s) reported on 24 Sep 2013 at 11:04AM.)  13-Jan-15 02:57   Magnesium, Serum  1.5 (1.8-2.4 THERAPEUTIC RANGE: 4-7 mg/dL TOXIC: > 10 mg/dL  -----------------------)   Phosphorus, Serum 4.1 (Result(s) reported on 28 Sep 2013 at 04:18AM.)  14-Jan-15 03:32   Glucose, Serum  215  BUN 15  Creatinine (comp) 0.99  Sodium, Serum 139  Potassium, Serum  3.4  Chloride, Serum 104  CO2, Serum 25  Calcium (Total), Serum  8.0  Anion Gap 10  Osmolality (calc) 285  eGFR (African American) >60  eGFR (Non-African American) >60 (eGFR values <38m/min/1.73 m2 may be an indication of chronic kidney disease (CKD). Calculated eGFR is useful in patients with stable renal function. The eGFR calculation will not be reliable in acutely ill patients when serum creatinine is changing rapidly. It is not useful in  patients on dialysis. The eGFR calculation may not be applicable to patients at the low and high extremes of body sizes, pregnant women, and vegetarians.)    10:10   Result Comment - READ-BACK PROCESS PERFORMED.  - Dr. KTressia Miners1/14/15 at 1128  Result(s) reported on 29 Sep 2013 at 11:30AM.  Urine Drugs:  [id number]  Tricyclic Antidepressant, Ur Qual (comp) NEGATIVE (Result(s) reported on 22 Sep 2013 at 07:33PM.)  Amphetamines, Urine Qual. NEGATIVE  MDMA, Urine  Qual. NEGATIVE  Cocaine Metabolite, Urine Qual. NEGATIVE  Opiate, Urine qual NEGATIVE  Phencyclidine, Urine Qual. NEGATIVE  Cannabinoid, Urine Qual. POSITIVE  Barbiturates, Urine Qual. NEGATIVE  Benzodiazepine, Urine Qual. POSITIVE (----------------- The URINE DRUG SCREEN provides only a preliminary, unconfirmed analytical test result and should not be used for non-medical  purposes.  Clinical consideration and professional judgment should be  applied to any positive drug screen result due to possible interfering substances.  A more specific alternate chemical method must be used in order to obtain a confirmed analytical result.  Gas chromatography/mass spectrometry (GC/MS) is the preferred confirmatory method.)  Methadone, Urine Qual. NEGATIVE  Routine Sero:  06-Jan-15 13:21   Rapid HIV  1/2 Ab Test with Confirmation ([facility]) NEG - HIV 1/2 AB This is a screening test for the presence of HIV-1/2 antibodies. False-negative and false-positive results can occur. All preliminary positive samples are sent for confirmatory testing. Clinical correlation is necessary to assess whether repeat testing may be needed for negative results.  Routine Hem:  14-Jan-15 03:32   WBC (CBC)  19.6  RBC (CBC)  2.86  Hemoglobin (CBC)  7.8  Hematocrit (CBC)  24.7  Platelet Count (CBC)  516  MCV 87  MCH 27.2  MCHC  31.4  RDW  15.0  Neutrophil % 71.9  Lymphocyte % 16.0  Monocyte % 11.2  Eosinophil % 0.2  Basophil % 0.7  Neutrophil #  14.1  Lymphocyte # 3.1  Monocyte #  2.2  Eosinophil # 0.0  Basophil # 0.1   Radiology Results: CT:    14-Jan-15 13:21, CT Head Without Contrast  CT Head Without Contrast   REASON FOR EXAM:    new onset seizures  COMMENTS:       PROCEDURE: CT  - CT HEAD WITHOUT CONTRAST  - Sep 29 2013  1:21PM     CLINICAL DATA:  Multiple seizures    EXAM:  CT HEAD WITHOUT CONTRAST    TECHNIQUE:  Contiguous axial images were obtained from the base of the skull  through the vertex without intravenous contrast.    COMPARISON:  10/17/2012 and previous  FINDINGS:  The brain has a normal appearance without evidence of malformation,  atrophy, old or acute infarction, mass lesion, hemorrhage,  hydrocephalus or extra-axial collection. The calvarium appears  normal. Visualized sinuses, middle ears and mastoids are clear.     IMPRESSION:  Normal head CT.  No lesion seen to explain seizure.      Electronically Signed    By: Nelson Chimes M.D.    On: 09/29/2013 13:24       Verified By: Jules Schick, M.D.,   Radiology Impression: Radiology Impression: normal   Impression/Recommendations: Recommendations:   young male with seizure, DKA (type I DM), h/o polysubstance abuse, HTN, diabetic neuropathyRecurrent seizures - 2 so far - non focal onset per history - likely  from recent metabolic abnormalities, was on levofloxacin (but was discontined before onset of seizure)agree with EEGreviewed CT head - normalagree with levetiracetam 1500 mg iv bid for 2 days then 500 mg iv bidseizure precautionsavoid hypoglycemia, hypomagnesemia, hypocalcemia, meds that lower seizure threshold 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 If it becames a major issue - he might need to be switched from levetiracetam to valproic acid or lacosamide (levetiracetam has been associated with psychiatric symptoms of irritability and psychosis in some pts) AMS - from # 1 and other metabolic encephalopathy (from DKA  and sepsis)thiamine 100 mg IV and multivitamin Diabetic neuropathy - should consider Metanx as out ptRespi failure with MRSA PNAAcute on chronic renal failuresepsiswill follwo.  Electronic Signatures: Ray Church (MD)  (Signed 27-Jan-15 12:29)  Authored: REFERRING PHYSICIAN, Primary Care Physician, Consult, History of Present Illness, Review of Systems, PAST MEDICAL/SURGICAL HISTORY, HOME MEDICATIONS, ALLERGIES, NURSING VITAL SIGNS, Physical Exam-, LAB RESULTS, RADIOLOGY RESULTS, Recommendations   Last Updated: 27-Jan-15 12:29 by Ray Church (MD)

## 2015-01-07 NOTE — Discharge Summary (Signed)
PATIENT NAME:  Maurice Davidson, Maurice Davidson MR#:  [id number] DATE OF BIRTH:  05-25-1986  DATE OF ADMISSION:  09/21/2013 DATE OF DISCHARGE:  10/06/2013   ADMITTING PHYSICIAN: Ceasar Lund. Anselm Jungling, MD  DISCHARGING PHYSICIAN: Gladstone Lighter, MD  PRIMARY ENDOCRINOLOGIST: Sherlon Handing, MD  [place]:  1.  Pulmonary critical care consultation by Dr. Mortimer Fries. 2.  Endocrinology consultation by Dr. Gabriel Carina.  3.  Neurology consultation by Dr. Jennings Books.  DISCHARGE DIAGNOSES: 1.  Toxic metabolic encephalopathy.  2.  Diabetic ketoacidosis.  3.  Type 1 diabetes mellitus.  4.  Sepsis.  5. Methicillin-resistant Staphylococcus aureus bacteremia and Methicillin-resistant Staphylococcus aureus pneumonia.  6.  Seizures while in the hospital.  7.  Aspiration pneumonia.  8.  Acute renal failure.  9.  Acute on chronic kidney disease.  10.  Electrolyte abnormalities including hypokalemia and hypomagnesemia.  11.  Chronic anemia.   DISCHARGE MEDICATIONS: 1.  Lisinopril 5 mg p.o. daily.  2.  Paroxetine 40 mg p.o. daily.  3.  Protonix 40 mg p.o. daily.  4.  Keppra 500 mg p.o. b.i.d.  5.  Levemir 10 units subcutaneously at bedtime.  6.  Aspart insulin 5 units 3 times a day before meals. 7.  Glucerna shake 1 can 3 times a day with meals.  8.  Ferrous sulfate 325 mg p.o. b.i.d.  9.  Vancomycin 750 mg IV q.12 hours until October 26, 2013.   DISCHARGE HOME OXYGEN: None.   DISCHARGE DIET: ADA 1800 calorie diet.   DISCHARGE ACTIVITY: As tolerated.  FOLLOWUP INSTRUCTIONS: The patient will follow up with rehab in 1 week for diabetes management.   LABORATORY AND IMAGING STUDIES PRIOR TO DISCHARGE:  1.  TEE done by Dr. Fletcher Anon showing normal LV ejection fraction, EF of 55% to 60%, no evidence of any valvular vegetations or endocarditis seen. Trivial pericardial effusion noted. No vegetations seen.  2.  Last WBC is 13.7 from October 05, 2013.  3.  Sodium 135, potassium 3.4, chloride 102,  bicarbonate 29, BUN 10, creatinine 0.77, glucose 103 and calcium of 8.5.  4.  Stool studies showing occult blood positive. C. difficile test was negative.  5.  His last blood cultures were from September 28, 2013, which were negative.  6.  His sputum cultures from September 22, 2013, growing MRSA.  7.  His blood cultures on admission on September 21, 2013, growing MRSA as well.   BRIEF HOSPITAL COURSE: For more details, please look at the history and physical dictated by Dr. Anselm Jungling on September 21, 2013, and also the interim summary dictated by Dr. Verdell Carmine on September 28, 2013. In brief, Maurice Davidson is a 29 year old, young Caucasian male with past medical history significant for type 1 diabetes mellitus, chronic kidney disease, presented to the hospital with altered mental status. He was encephalopathic on September 21, 2013. He was noted to be in DKA and also acute respiratory failure and acute renal failure.  1.  Altered mental status secondary to metabolic encephalopathy from his DKA, and also he was septic on admission. The patient was intubated and treated with broad-spectrum antibiotics, but once the source of his infection was identified, which was MRSA bacteremia and also pneumonia, his antibiotics have been narrowed down to only vancomycin. His repeat cultures were negative from September 28, 2013, and will need a 4-week treatment for his MRSA bacteremia and pneumonia. He also had a TTE and a TEE, which did not reveal any vegetations, so he will not be treated for endocarditis.  His DKA has also been resolved by insulin drip.  2.  Type 1 diabetes mellitus with DKA: Was seen by Dr. Gabriel Carina. He was also following with her as an outpatient. He had episodes of hypoglycemia and hyperglycemia post extubation, so his  insulin is being managed extremely carefully. Right now, he is on Levemir 10 units at bedtime and 5 units of aspart insulin prior to meals, and that has been controlling his sugars reasonably well, but he will  need to be followed by a PCP for the same and outpatient followup with Dr. Gabriel Carina recommended as well.  3.  Acute respiratory failure: Had MRSA pneumonia on admission. Was extubated after being on vent for almost a week and then had seizure episodes, for which he aspirated and was monitored again in ICU. He was requiring higher amounts of oxygen, but able to pull through it, and currently he is on Zosyn for his aspiration pneumonia, which he will finish off in the next day. He was seen by infectious disease specialist, Dr. Ola Spurr, as well.  4.  Seizure disorder: The patient does not have any history of seizures, but he did have a history of substance abuse as an outpatient, but he has been here for more than a week after he had his first seizure post extubation. His sugars were stable at the time. Not sure if the infection, sepsis could have triggered it. He had 3 back-to-back seizures and was monitored in ICU, was started on Keppra with no further seizures. Neurology has seen the patient, and they recommended continuing Keppra at this time. After Keppra was being started, he has not had further seizures.  5.  Electrolyte abnormalities: While in ICU, the patient's electrolytes were replaced appropriately and he is more stable at this time.  6.  Anemia of chronic disease: Hemoglobin remained stable. He is on iron supplements.  7.  His course has been otherwise uneventful so far.   DISCHARGE CONDITION: Stable.   DISCHARGE DISPOSITION: To short-term rehab, as he will need 3 more weeks of IV antibiotics, and it is not safe to discharge this patient home with a PICC line for the IV antibiotics.   TIME SPENT ON DISCHARGE: 45 minutes.   ____________________________ Gladstone Lighter, MD rk:jcm D: 10/06/2013 15:05:13 ET T: 10/06/2013 15:39:47 ET JOB#: [id number]  cc: Gladstone Lighter, MD, <Dictator> A. Lavone Orn, MD Gladstone Lighter MD ELECTRONICALLY SIGNED 10/14/2013 13:40

## 2015-01-07 NOTE — Discharge Summary (Signed)
PATIENT NAME:  Maurice Davidson, CATTERTON MR#:  [id number] DATE OF BIRTH:  1986/04/16  DATE OF ADMISSION:  09/21/2013 DATE OF DISCHARGE:  10/07/2013  ADDENDUM:  This is an addendum to previously dictated discharge summary by Dr. Tressia Miners. The patient has a bed available at [facility] in [place] where he is being discharged. Everything remains same, except one medication changed, which is Xanax 1 mg p.o. at bedtime as needed for insomnia, which he was already using at home. He just forgot to bring it on his admission. He will need PICC line care per protocol while at the facility. The patient's antibiotics will be stopped on 10th of February with a goal vancomycin trough of 15 to 20. He will need a weekly CBC, complete metabolic panel and vancomycin trough which can be faxed to Dr. Adrian Prows at [facility] infectious disease.   Total time taking care of this patient was 40 minutes.   ____________________________ Lucina Mellow. Manuella Ghazi, MD vss:sg D: 10/07/2013 08:44:00 ET T: 10/07/2013 08:57:40 ET JOB#: [id number]  cc: Physician at [place]. Manuella Ghazi, MD, <Dictator>    Lucina Mellow [facility] MD ELECTRONICALLY SIGNED 10/12/2013 13:27

## 2015-01-07 NOTE — Consult Note (Signed)
PATIENT NAME:  Maurice Davidson, VICARIO MR#:  [id number] DATE OF BIRTH:  [birth year]  DATE OF CONSULTATION:  11/19/2013  CONSULTING PHYSICIAN:  Jerrol Banana. Burt Knack, MD  CHIEF COMPLAINT: Diarrhea.   HISTORY OF PRESENT ILLNESS: This is a 29 year old thin male patient who has diabetes and gastroparesis, who presents with diarrhea. He is C. diff negative and was recently in the hospital at [facility] with MRSA in a PICC line, being treated for pneumonia. I was asked to see the patient because his CT scan suggested SMA syndrome in a patient with known gastroparesis.   The patient has had no melena, no hematochezia.   PAST MEDICAL HISTORY: Diabetes, gastroparesis, neuropathy, depression, MRSA.   PAST SURGICAL HISTORY: None.   ALLERGIES: None.   FAMILY HISTORY: Noncontributory.   SOCIAL HISTORY: The patient smokes tobacco. Does not drink alcohol.   REVIEW OF SYSTEMS: Ten-system review is performed and negative with the exception of that mentioned in the HPI.    MEDICATIONS: Multiple, see chart.   PHYSICAL EXAMINATION: GENERAL: Very thin-appearing male patient with a BMI of 17.  HEENT: Shows no scleral icterus.  NECK: No palpable neck nodes.  CHEST: Clear to auscultation.  CARDIAC: Regular rate and rhythm.  ABDOMEN: Soft, nondistended and virtually nontender.  EXTREMITIES: Without edema.  NEUROLOGIC: Grossly intact.  INTEGUMENT: Shows no jaundice.   CT scan is personally reviewed. Dilatation of the stomach. No sign of internal hernia, etc.   ASSESSMENT AND PLAN: This is a patient who had no clinical findings to suggest acute superior mesenteric artery syndrome. I discussed with Dr. Darvin Neighbours and with the Emergency Room physician SMA syndrome, and this patient has had no active weight loss recently. I would recommend possible nutrition consult, but no surgical plans at this time.    ____________________________ Jerrol Banana Burt Knack, MD rec:dmm D: 11/19/2013 22:11:07 ET T: 11/19/2013 22:58:10  ET JOB#: [id number]  cc: Jerrol Banana. Burt Knack, MD, <Dictator> Florene Glen MD ELECTRONICALLY SIGNED 11/20/2013 3:54

## 2015-01-07 NOTE — Op Note (Signed)
PATIENT NAME:  Maurice Davidson, Maurice Davidson MR#:  [id number] DATE OF BIRTH:  [birth year]  DATE OF PROCEDURE:  10/03/2013  PREOPERATIVE DIAGNOSES:  1.  Methicillin-resistant Staphylococcus aureus   bacteremia.  2.  Diabetic ketoacidosis.  3.  Acute respiratory failure.   POSTOPERATIVE DIAGNOSES:  1.  Methicillin-resistant Staphylococcus aureus   bacteremia.  2.  Diabetic ketoacidosis.  3.  Acute respiratory failure.   PROCEDURES:  1. Ultrasound guidance for vascular access to right brachial vein.  2. Fluoroscopic guidance for placement of catheter.  3. Insertion of peripherally inserted central venous catheter, triple-lumen, right arm.  SURGEON: Katha Cabal, M.D.   ANESTHESIA: Local.   ESTIMATED BLOOD LOSS: Minimal.   INDICATION FOR PROCEDURE:  1.  Requiring intravenous antibiotics greater than 5 days.  2.  The patient with poor venous access requiring multiple parenteral vesicant drugs.   DESCRIPTION OF PROCEDURE: The patient's right arm was sterilely prepped and draped, and a sterile surgical field was created. The right brachial vein was accessed under direct ultrasound guidance without difficulty with a micropuncture needle and permanent image was recorded. 0.018 wire was then placed into the superior vena cava. Peel-away sheath was placed over the wire. A single lumen peripherally inserted central venous catheter was then placed over the wire and the wire and peel-away sheath were removed. The catheter tip was placed into the superior vena cava and was secured at the skin at 30 cm with a sterile dressing. The catheter withdrew blood well and flushed easily with heparinized saline. The patient tolerated procedure well.   ____________________________ Katha Cabal, MD ggs:np D: 10/05/2013 17:29:00 ET T: 10/05/2013 19:30:09 ET JOB#: [id number]  cc: Katha Cabal, MD, <Dictator> Katha Cabal MD ELECTRONICALLY SIGNED 10/27/2013 13:24

## 2015-01-07 NOTE — Consult Note (Signed)
PATIENT NAME:  Maurice Davidson, Maurice Davidson MR#:  [id number] DATE OF BIRTH:  [birth year]  DATE OF CONSULTATION:  11/20/2013  REFERRING PHYSICIAN:  Dr. Leslye Peer CONSULTING PHYSICIAN:  Arther Dames, MD  REASON FOR THE CONSULTATION:  Esophagitis, gastroparesis.   HISTORY OF PRESENT ILLNESS:  [name] is a 29 year old male with a complicated past medical history including diabetes complicated by gastroparesis, polysubstance abuse, history of MRSA bacteremia and pneumonia, who is presenting to the Emergency Room for evaluation of nausea, vomiting, abdominal pain and diarrhea.  [name] reports that these symptoms started about 3 to 4 days prior to presenting to the Emergency Room, but worsened and hence he presented to the Emergency Room.   In the Emergency Room, he had a CAT scan of his abdomen which showed thickening in the distal esophagus along with proximal dilation of the small bowel and stomach with a transition point in the midline suggestive of possible SMA syndrome.   Today [name] reports that he is feeling significantly improved on the hospital management.  He is tolerating by mouth and is actually hoping to be able to go home tomorrow.   PAST MEDICAL HISTORY: 1.  DM 2.  2.  Neuropathy.  3.  Gastroparesis.  4.  Polysubstance abuse.  5.  Depression.  6.  MRSA bacteremia and pneumonia.   ALLERGIES:  No known drug allergies.   FAMILY HISTORY:  Denies any history of GI malignancy to me.   SOCIAL HISTORY:  He is an ongoing smoker.  He denies alcohol at this time.   HOME MEDICATIONS:  1.  Xanax 1 mg at bedtime as needed.  2.  Insulin 5 units three times daily.  3.  Lantus 10 units at bedtime.  4.  Keppra 500 mg twice a day.  5.  Lisinopril 5 mg daily.  6.  Omeprazole 20 mg daily.  7.  Zofran 4 mg q. 8 hours.  8.  Paroxetine 40 mg daily.   REVIEW OF SYSTEMS:   CONSTITUTIONAL: No weight gain or weight loss.  No fever or chills. HEENT: No oral lesions or sore throat. No vision  changes. GASTROINTESTINAL: See HPI.  HEME/LYMPH: No easy bruising or bleeding. CARDIOVASCULAR: No chest pain or dyspnea on exertion. GENITOURINARY: No hematuria. INTEGUMENTARY: No rashes or pruritus PSYCHIATRIC: No depression/anxiety.  ENDOCRINE: No heat/cold intolerance, no hair loss or skin changes. ALLERGIC/IMMUNOLOGIC: Negative for hives. RESPIRATORY: No cough, no shortness of breath.  MUSCULOSKELETAL: No joint swelling or muscle pain.   PHYSICAL EXAMINATION: VITAL SIGNS:  Current temperature is 98.6.  His pulse is 90, respirations are 18, blood pressure 174/100, pulse ox is 96% on room air.   GENERAL:  Alert and oriented times 4.  No acute distress. Appears stated age.  Underweight-appearing male.  Slightly anxious. HEENT:  Normocephalic/atraumatic. Extraocular movements are intact. Anicteric. NECK:  Soft, supple. JVP appears normal. No adenopathy. CHEST:  Clear to auscultation. No wheeze or crackle. Respirations unlabored. HEART:  Regular. No murmur, rub, or gallop.  Normal S1 and S2. ABDOMEN:  Soft, nondistended.  Mild diffuse tenderness.  Normal active bowel sounds in all four quadrants.  No organomegaly. No masses EXTREMITIES:  No swelling, well perfused. SKIN:  No rash or lesion. Skin color, texture, turgor normal. NEUROLOGICAL:  Grossly intact. PSYCHIATRIC:  Normal tone and affect. MUSCULOSKELETAL:  No joint swelling or erythema.   LABORATORY DATA:  Sodium is 135, creatinine 1.23, potassium 3.2.  Lipase is 93, albumin 2.9, alk phos 195, AST and ALT are normal.  White  count is 20, hemoglobin is 11, hematocrit is 34, platelet counts are 480.  The C. diff. is negative.  FOBT is positive.  CT scan:  See HPI.   ASSESSMENT AND PLAN:   1.  Esophagitis:  He does appear to have a significant esophagitis on CT scan.  This would potentially fit with his symptoms and his improvement on PPI.  Thankfully he is doing significantly better in terms of this.  PLAN:  He will need to stay on  Protonix 40 mg twice daily for two months and then 40 mg daily thereafter.  He can also use Carafate 1 gram three times daily if he continues to have symptoms on the Protonix.  In terms of upper endoscopy, there is no urgency to perform upper endoscopy at this time.  Maurice Davidson would like to get out of the hospital as soon as possible which I likely anticipate will be tomorrow.  I will plan to follow him up in clinic and will likely plan for an upper endoscopy to check for healing of this esophagitis.  2.  Concern for superior mesenteric artery syndrome.  He has been evaluated by surgery in terms of this who does not feel this is consistent with an acute superior mesenteric artery syndrome.  I will plan to obtain an upper GI series just to further rule this out as a possibility and a potential cause of his symptoms.  3.  Gastroparesis.  He should continue on a low residue, low particle, small frequent meal diet.  He cannot be on long-term Reglan due to the black box warning for long-term neurological complications.  We can manage this further as an outpatient.   Thank you for this consultation.    ____________________________ Arther Dames, MD mr:ea D: 11/20/2013 21:45:51 ET T: 11/21/2013 05:33:57 ET JOB#: [id number]  cc: Arther Dames, MD, <Dictator> Mellody Life MD ELECTRONICALLY SIGNED 12/09/2013 9:33

## 2015-01-07 NOTE — H&P (Signed)
PATIENT NAME:  Maurice Davidson, Maurice Davidson MR#:  [id number] DATE OF BIRTH:  1986-03-14  DATE OF ADMISSION:  11/19/2013  PRIMARY CARE PROVIDER: Tomasita Morrow, MD   CHIEF COMPLAINT: Nausea, vomiting, diarrhea, abdominal pain.   HISTORY OF PRESENT ILLNESS: A 29 year old Caucasian male patient with history of insulin-dependent diabetes mellitus, gastroparesis, depression, polysubstance abuse, presents to the Emergency Room complaining of nausea, vomiting, abdominal pain, and diarrhea. The patient has had some dry cough. The patient was recently seen in the hospital a month prior for a MRSA pneumonia and MRS bacteremia, had a PICC line placed and was sent to rehab. He finished his antibiotics and is presently at home. Had been doing well until these symptoms started 4 days prior. The patient already had a C. difficile checked in the Emergency Room, which is negative. His last bowel movement was an hour prior in the Emergency Room. His last episode of vomiting was yesterday evening. The patient has been able to keep some fluids down but unable to eat.   He has not had any fever or rash at home. Denies any substance abuse, but does continue to smoke. His family is at bedside.   PAST MEDICAL HISTORY: 1.  Insulin-dependent diabetes mellitus.  2.  Neuropathy.  3.  Gastroparesis.  4.  Polysubstance abuse. He was in a methadone program in the past.  5.  Depression.  6.  Chronic history of anemia.  7.  Recent MRSA bacteremia and pneumonia.   PAST SURGICAL HISTORY: EGD, colonoscopy for gastroparesis.   ALLERGIES: No known drug allergies.   FAMILY HISTORY: Diabetes mellitus in his grandparents. Mother has osteoporosis.   SOCIAL HISTORY: The patient lives at home with his parents. Smokes 1 pack of cigarettes a day. Does not drink any alcohol. Uses on-and-off marijuana.   HOME MEDICATIONS:  1.  Alprazolam 1 mg oral at bedtime as needed for anxiety.  2.  Glucerna shake 3 times a day.  3.  Insulin aspart 5 units  subcutaneous 3 times a day before meals.  4.  Insulin Lantus 10 units at bedtime.  5.  Keppra 500 mg 2 times a day.  6.  Lisinopril 5 mg once a day.  7.  Omeprazole 20 mg daily.  8.  Ondansetron 4 mg oral every 8 hours.  9.  Paroxetine 40 mg oral once a day.   REVIEW OF SYSTEMS:  CONSTITUTIONAL: No fever but has fatigue, weakness.  EYES: No blurry vision, pain, redness.  ENT: No tinnitus, ear pain, hearing loss.  RESPIRATORY: Has a dry cough. No wheeze, hemoptysis.  CARDIOVASCULAR: No chest pain, orthopnea, edema.  GASTROINTESTINAL: Has nausea, vomiting, diarrhea, abdominal pain.  GENITOURINARY: No dysuria, hematuria, frequency. Has decreased urination.  ENDOCRINE: No polyuria, nocturia, thyroid problems.  HEMATOLOGIC AND LYMPHATIC: Has chronic anemia. No easy bruising, bleeding.  INTEGUMENTARY: No acne, rash, lesion.  MUSCULOSKELETAL: Has muscle wasting.  NEUROLOGIC: No focal numbness, weakness. Does have neuropathy.  PSYCHIATRIC: Has anxiety, depression.   PHYSICAL EXAMINATION: VITAL SIGNS: Temperature 97.6, pulse of 107, blood pressure 131/95, saturating 98% on room air.  GENERAL: Thin Caucasian male patient lying in bed in mild distress secondary to his abdominal pain.  PSYCHIATRIC: Alert and oriented x 3, anxious.  HEENT: Atraumatic, normocephalic. Oral mucosa dry and pink. Pallor positive. No icterus. Pupils bilaterally equal and reactive to light.  NECK: Supple. No thyromegaly or palpable lymph nodes. Trachea midline. No carotid bruit, JVD.  CARDIOVASCULAR: S1, S2, tachycardic without any murmurs. Peripheral pulses 2+. No edema.  RESPIRATORY:  Normal work of breathing. Clear to auscultation on both sides.  GASTROINTESTINAL: Soft abdomen, nontender. Bowel sounds present. No organomegaly palpable. No discrete guarding found.  GENITOURINARY: No significant bladder distention.  SKIN: Warm and dry. Has some skin excoriations at the tips of the fingers and toes.  NEUROLOGICAL: Motor  strength 5/5 in upper and lower extremities. Sensation is decreased in lower extremities.   LABORATORY AND RADIOLOGICAL DATA: WBC 30.6, hemoglobin 13, platelets of 560. C. difficile negative. Urinalysis shows no bacteria. Stool for blood was positive.   Glucose of 368, BUN 47, creatinine 1.66, sodium 126, potassium 3.2, chloride 88, GFR 55, with lipase of 93. AST, ALT, bilirubin normal. Alkaline phosphatase mildly elevated at 259. He has had elevation of alkaline phosphatase chronically.   CT scan of the abdomen and pelvis without contrast shows right lower lobe pneumonia along with severe esophagitis. 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 Left nephrolithiasis.   ASSESSMENT AND PLAN: 1.  Nausea, vomiting, diarrhea, and abdominal pain raises concern if patient has Clostridium difficile, but this has turned up negative. Will check for stool WBC and cultures. Presently will treat symptomatically with Zofran and Reglan with his history of gastroparesis. The patient does have significant dehydration secondary to poor p.o. intake and vomiting, diarreah. 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 Likely due to inadequate mesentric fat. Benign abd on exam. Dr. Burt Knack from surgery has seen pt and advised conservative management. 2.  Right lower lobe pneumonia: The patient did have bilateral pneumonia during the last admission. This seems to be a new pneumonia, possibly aspiration. Will cover for both aspiration pneumonia and healthcare-acquired with vancomycin and Zosyn. Will get sputum and blood cultures. He does have significant leukocytosis at 30,000 secondary to pneumonia and also contributed to by hemoconcentration from the dehydration.  3.  Acute renal failure secondary to dehydration, acute tubular necrosis: Will start him on aggressive IV fluid resuscitation. The patient is not hypotensive but tachycardic and looks dry on examination.  4.  Severe esophagitis on the CT scan: Will consult  gastroenterology. He does have heme- positive stools, likely from this site. Will not use any heparin at this time. He does have history of chronic anemia, but his hemoglobin is stable at 13. I do not think he has any acute bleed, but will not use any heparin. Will await GI input with this case.  5.  Tobacco abuse: Counseled the patient to quit smoking for greater than 3 minutes.  6.  Recent methicillin-resistant Staphylococcus aureus pneumonia: The patient is being covered with  vancomycin.  7.  Hypokalemia: Replace IV and p.o.  8.  Hypertension: Hold lisinopril secondary to acute renal failure.  9.  Deep vein thrombosis prophylaxis with sequential compression devices.   CODE STATUS: Full code.   TIME SPENT TODAY ON THIS CASE: 60 minutes.    ____________________________ Leia Alf Osa Fogarty, MD srs:jcm D: 11/19/2013 20:29:15 ET T: 11/19/2013 20:49:47 ET JOB#: [id number]  cc: Alveta Heimlich R. Gurbani Figge, MD, <Dictator> Myrle Sheng. Jimmye Norman, MD  Neita Carp MD ELECTRONICALLY SIGNED 11/20/2013 8:40
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
## 2015-01-07 NOTE — Consult Note (Signed)
Brief Consult Note: Diagnosis: diarrhea.   Patient was seen by consultant.   Consult note dictated.   Recommend further assessment or treatment.   Discussed with Attending MD.   Comments: No clinical sign of acute SMA syndrome. Very thin b habitus but no recent wt loss may benefit from nutrition consult no surgical plans.  Electronic Signatures: Florene Glen (MD)  (Signed 06-Mar-15 20:56)  Authored: Brief Consult Note   Last Updated: 06-Mar-15 20:56 by Florene Glen (MD)

## 2015-01-07 NOTE — Consult Note (Signed)
Chief Complaint and History:  Referring Physician Dr. Verdell Carmine   Chief Complaint DKA   Allergies:  No Known Allergies:   Assessment/Plan:  Assessment/Plan 29 yo M with type 1 diabetes admitted for DKA.   Pt seen, examined, and sister at bedside was interviewed. Pt intubated and sedated. Critically ill. Current FSBS = 24  A/ DKA, resolved Type 1 DM, severely uncontrolled Hypoglycemia  P/ Will given D 50 2 amp now Will continue frequent FSBS q15-30 min, d/w RN Will start basal-bolus insulin when ready.  Full consult to be dictated.   Electronic Signatures: Judi Cong (MD)  (Signed 07-Jan-15 12:58)  Authored: Chief Complaint and History, ALLERGIES, Assessment/Plan   Last Updated: 07-Jan-15 12:58 by Judi Cong (MD)

## 2015-01-07 NOTE — Consult Note (Signed)
PATIENT NAME:  Maurice Davidson, ADACHI MR#:  [id number] DATE OF BIRTH:  1986-04-22  DATE OF CONSULTATION:  09/28/2013  INFECTIOUS DISEASE CONSULTATION  REQUESTING PHYSICIAN:  Dr. Verdell Carmine  CONSULTING PHYSICIAN:  Cheral Marker. Ola Spurr, MD  REASON FOR CONSULTATION:  MRSA bacteremia and pneumonia.  HISTORY OF PRESENT ILLNESS: This is a 29 year old poorly controlled insulin-dependent diabetes who was admitted 01/06 with altered mental status and found to be in severe DKA. It sounds like for a few days prior he had been having increase in altered status but missing some insulin doses and may be having some diarrhea. The history is obtained from chart review, as the patient has been recently extubated and transferred from the Intensive Care Unit. He is somewhat confused still. In the Intensive Care Unit, he was treated for his DKA. He was found to have MRSA growing in sputum and blood cultures.  He has been treated with vancomycin. Since that time, his fevers have resolved. He has been able to be extubated and he is now out of the Intensive Care Unit. Of note, he did have a negative HIV test. We are consulted for further recommendations and duration of antibiotics.   PAST MEDICAL HISTORY: 1.  Poorly controlled insulin-dependent diabetes.  2.  Tobacco abuse.  3.  Neuropathy.  4.  Gastroparesis.  5.  Polysubstance abuse prior, in a Methadone Clinic.  6.  Depression.  7.  Chronic anemia.  8.  Chronic kidney disease.   PAST SURGICAL HISTORY: History of EGD and colonoscopy for gastroparesis.   FAMILY HISTORY: Positive for diabetes in grandparents.   SOCIAL HISTORY: The patient lives with his parents. He smokes a half to 1 pack a day. He does not drink alcohol.   ALLERGIES:  No known drug allergies.   REVIEW OF SYSTEMS: The patient is confused and difficult to obtain a review of systems.   PHYSICAL EXAMINATION: VITAL SIGNS: T-current 97.9. His last fever was 01/08 at 101. His pulse is 115, blood pressure  128/74, respirations 28, sat 90% on 2 liters.  GENERAL: He is disheveled. He is somewhat confused. He is lying in bed. He is thin. He has poor dentition.  HEENT: Pupils equal, round and reactive to light and accommodation. He has dry mucous membranes.  NECK: Supple. He has no anterior cervical, posterior cervical or supraclavicular lymphadenopathy.  HEART: Regular but tachy.  LUNGS: Coarse, bilateral rhonchi, poor air movement.  ABDOMEN: Soft, nontender, nondistended.  EXTREMITIES: He has no clubbing, cyanosis or edema.  NEUROLOGIC: He is confused, difficult putting sentences together, but does follow commands. Able to move all 4 extremities.  SKIN: He has some healing scabs on his hand, as well as scars on his lower extremity. His vascular accesses is with a right IJ line that is clean, dry and intact.  LABORATORY AND DIAGNOSTIC DATA:  Cultures done on admission grew MRSA in one blood culture, but it appears only one blood culture was sent. This was sensitive to gentamicin, vancomycin, linezolid and tigecycline. It was resistant to clindamycin. Sputum culture also done grew heavy growth MRSA. Again it was sensitive to gentamicin, vancomycin, linezolid, tigecycline and Bactrim. Resistant to clindamycin. White blood count on admission was 20.3. It had remained elevated and is currently 19. Neutrophil count is 14. Current renal function shows a creatinine of 0.89. On admission it was 1.96. HIV serology is negative. Flu testing was negative. Urine culture was negative.  IMAGING:  Chest x-ray done on admission, 01/06 shows bilateral perihilar and lower lobe zone  airspace disease worse on the right. Follow-up chest x-ray 01/12 showed significant interval change in the appearance of the chest with bilateral airspace disease. There is faint air bronchogram.   IMPRESSION:  A 29 year old poorly controlled diabetic with polysubstance abuse, as well who admitted 01/06 with severe illness with diabetic  ketoacidosis, leukocytosis, fever and bilateral pneumonia. Cultures of sputum grew methicillin-resistant Staphylococcus aureus. He has been intubated and just was extubated today. Blood count remains elevated at 19, although he is afebrile.   RECOMMENDATIONS: 1.  At this point he needs at least a 21-day course of antibiotics for the MRSA pneumonia. The MRSA bacteremia could be treated with 2 weeks of IV therapy if we can insure ourselves that he is clinically improving and does not have any evidence of metastatic sites. It is difficult to get a good history from him at this point, since he has just been extubated and is still confused.  Today, I would recommend repeating blood cultures x 2.  2.  I would suggest checking a TTE to evaluate for any evidence of endocarditis. I would hold on a TEE at this point.  3.  As he improves, we can see if he report any signs of metastatic foci of infection that might explain the elevated white count.  Thank you for the consult. I will glad to follow with you.   ____________________________ Cheral Marker. Ola Spurr, MD dpf:ce D: 09/28/2013 16:34:25 ET T: 09/28/2013 17:17:05 ET JOB#: [id number]  cc: Cheral Marker. Ola Spurr, MD, <Dictator> Anissia Wessells Ola Spurr MD ELECTRONICALLY SIGNED 10/02/2013 22:09

## 2015-01-07 NOTE — Consult Note (Signed)
Brief Consult Note: Diagnosis: Esophagitis.   Patient was seen by consultant.   Consult note dictated.   Recommend further assessment or treatment.   Orders entered.   Comments: He is doing much better clincally today.  N/v,  abd pain all nearly resolved.   1.) Esophagitis on CT: - protonix 40 mg BID for 8 weeks, then 40 mg daily - carafate 1 gr TID dissovle in 1 tblsp water - gi clinic f/u for consideration of EGD  2.)Gastric and prox small bowel dilation on CT: - agree this is likely incidental but will peform UGI series to further r/o sma syndrome  3.) Gastroparesis: - low residue diet - small freqeunt meals - cannot stay on regan long term ( contraindicated).  - GI clinic f/u.  Electronic Signatures: Arther Dames (MD)  (Signed 07-Mar-15 18:16)  Authored: Brief Consult Note   Last Updated: 07-Mar-15 18:16 by Arther Dames (MD)

## 2015-01-07 NOTE — Discharge Summary (Signed)
PATIENT NAME:  Maurice Davidson, Maurice Davidson MR#:  [id number] DATE OF BIRTH:  1985-11-17  DATE OF ADMISSION:  11/19/2013 DATE OF DISCHARGE:  11/21/2013  PRIMARY CARE PHYSICIAN: Dr. Tomasita Morrow.   FINAL DIAGNOSES:  1. Systemic inflammatory response syndrome with diarrhea and pneumonia seen on CAT scan of the chest at lung base.  2. Hypokalemia and hypomagnesemia.  3. Accelerated hypertension.  4. Diabetes with gastroparesis.  5. Esophagitis.  6. Acute renal failure which resolved.   MEDICATIONS ON DISCHARGE: Include Paxil 40 mg daily, insulin detemir 10 units subcutaneous injection at bedtime, insulin aspart 5 units subcutaneous injection 3 times a day prior to meals, Glucerna shake 237 mL 3 times a day with meals, Zofran 4 mg every 8 hours as needed, Keppra 500 mg twice a day, lisinopril 20 mg daily, Xanax 1 mg twice a day as needed for anxiety and nervousness, Protonix 40 mg twice a day, Levaquin 500 mg once a day at bedtime for 8 more days, Reglan 10 mg 4 times a day before meals and at bedtime.   DIET: Low-sodium, carbohydrate-controlled diet. Dietary supplements. Glucerna 3 times a day. Regular consistency on the diet.   FOLLOWUP: With Dr. Rayann Heman, gastroenterology; 1 to 2 weeks with your medical doctor.   HOSPITAL COURSE: The patient was admitted 11/19/2013 and discharged 11/21/2013. Was admitted with nausea, vomiting, diarrhea and abdominal pain. Stool for C. difficile was sent off. The patient had a right lower lobe pneumonia seen on CT scan of the abdomen. Was initially started on vancomycin and Zosyn   LABORATORY AND RADIOLOGICAL DATA: Blood cultures negative. Glucose 338, BUN 47, creatinine 1.66, sodium 126, potassium 3.2, chloride 88, CO2 24, calcium 9.1. Liver function tests normal range. Lipase 93. White blood cell count 30.6, H and H 13.0 and 39.1, platelet count of 560. ABG showed a pH of 7.40, pCO2 of 38, pO2 of 71, oxygen 21%, O2 saturation 96.1. Urine toxicology positive for cannabis. Occult  blood positive. Stool culture negative for E. coli and Campylobacter. Urine culture still pending. Stool for C. difficile was negative. Urinalysis negative nitrites and leukocyte esterase. CT scan of the abdomen and pelvis without contrast showed ground-glass diffuse tree bud densities within the visualized right lower lobe. Could reflect small airway disease, alveolitis or bronchiolitis. Marked circumferential wall thickening visualized distal esophagus suggesting esophagitis. Dilated stomach and proximal duodenum. Left nephrolithiasis. Chest x-ray with mild bronchitic changes. Magnesium 1.5. Magnesium upon discharge 1.9, potassium 3.5, white blood cell count 8.5.   HOSPITAL COURSE PER PROBLEM LIST:  1. For the systemic inflammatory response syndrome, could be secondary to diarrhea versus pneumonia. The patient's stool studies at the time of discharge were negative. Did have blood in the stool. The patient was put on empiric Zosyn and vancomycin for pneumonia, but the patient did not have any respiratory symptoms. The patient did have a recent diagnosis of MRSA pneumonia and was treated fully with vancomycin. Without respiratory symptoms, I do not think that this is a pneumonia. I did switch his Zosyn and vancomycin over to Levaquin upon discharge. The patient's diarrhea had subsided. He does have 2 loose bowel movements a day and that is normal for him. The patient was discharged home in stable condition.  2. Hypokalemia and hypomagnesemia, likely secondary to nausea, vomiting and diarrhea. This was replaced during the hospital course. Since the patient is tolerating a diet, I do not need to replace this as outpatient.  3. For his accelerated hypertension, initially his lisinopril was held with the  acute renal failure. I restarted that back at a higher dose of 20 mg daily.  4. Diabetes with gastroparesis: The patient is on Reglan, detemir and aspart insulin.  5. Esophagitis: The patient was seen in  consultation by Dr. Rayann Heman who will follow up as outpatient. No need for endoscopy here. The patient is on Protonix twice a day.  6. Acute renal failure: This had improved with IV fluid hydration. No further workup needed.   The patient was discharged home in stable condition.   ____________________________ Tana Conch. Leslye Peer, MD rjw:gb D: 11/21/2013 13:44:17 ET T: 11/22/2013 02:45:34 ET JOB#: [id number]  cc: Tana Conch. Leslye Peer, MD, <Dictator> Myrle Sheng. Jimmye Norman, MD Marisue Brooklyn MD ELECTRONICALLY SIGNED 11/25/2013 16:20

## 2015-01-08 NOTE — H&P (Signed)
PATIENT NAME:  Maurice Davidson, WEIAND MR#:  [id number] DATE OF BIRTH:  November 19, 1985  DATE OF ADMISSION:  10/28/2011  PRIMARY CARE PHYSICIAN: Lucilla Lame, MD  REFERRING PHYSICIAN: Robb Matar, MD  CHIEF COMPLAINT: Abdominal pain, nausea and vomiting for two days.   HISTORY OF PRESENT ILLNESS: The patient is a 29 year old Caucasian male with a history of diabetes, one kidney stone, gastroparesis, neuropathy, and substance abuse who presented to the ED with abdominal pain, nausea and vomiting for two days. The patient started to have abdominal pain, nausea, and vomiting two days ago, which is intermittent, 8 out of 10, sharp, and associated with nausea and vomiting multiple times, but no diarrhea. The patient said he could not eat due to nausea and vomiting. He denies any fever or chills. No dysuria, hematuria, or urinary urgency. The patient was diagnosed with a kidney stone nine months ago. He had a urinary tract infection multiple times. CAT scan of the abdomen and pelvis, in the ED, shows 1 mm kidney stone. Urologist. Dr. Yves Dill, evaluated the patient and he did not think that the patient needed any procedure.   PAST MEDICAL HISTORY:  1. Diabetes. 2. One kidney stone. 3. Neuropathy. 4. Gastroparesis. 5. Substance abuse. The patient was on a methadone program, but he said he is off the methadone program now.    PAST SURGICAL HISTORY: None.   DRUG ALLERGIES: No known drug allergies.   MEDICATIONS:  1. Lantus 8 units subcutaneous at bedtime. 2. Sliding scale with NovoLog. 3. Reglan 10 mg t.i.d. p.r.n.  4. Gabapentin.  SOCIAL HISTORY: The patient has smoked one pack a day for more than 10 years. He denies any alcohol drinking, but history of substance abuse.   FAMILY HISTORY: Positive for diabetes.   REVIEW OF SYSTEMS: CONSTITUTIONAL: The patient denies any fever or chills. No headache or dizziness. No weakness. EYES: No blurred vision or double vision, or glaucoma. ENT: No epistaxis, discharge, or  postnasal drip. RESPIRATORY: No cough, sputum, or shortness of breath. No hemoptysis. CARDIOVASCULAR: No chest pain, palpitations, orthopnea, dyspnea, or leg edema. GASTROINTESTINAL: Positive for abdominal pain, nausea, and vomiting. No diarrhea. No melena or bloody stool. GU: No dysuria, hematuria, or incontinence. ENDOCRINE: No polyuria, polydipsia, or cold or heat intolerance. HEMATOLOGY: No easy bruising or bleeding. SKIN:  Has a chronic rash on the body and some ulcers due to burning. PSYCHIATRIC: No depression or anxiety.   PHYSICAL EXAMINATION:   VITALS: Temperature 97.8, blood pressure 147/82, pulse oximetry 100% on room air, pain scale 10/10, pulse 105, respirations 18.   GENERAL: The patient is alert, awake, and oriented, in no distress.   HEENT: Pupils round, equal, and reactive to light and accommodation. Clear oropharynx. Moist oral mucosa.   NECK: Supple. No JVD or carotid bruits. No lymphadenopathy. No thyromegaly.  CARDIOVASCULAR: S1 and S2 regular rate and rhythm. No murmurs or gallops.   PULMONARY: Bilateral air entry. No wheezing or rales.   ABDOMEN: Soft. Bowel sounds present, diffuse. Tender. No guarding. No rebound.   EXTREMITIES: No edema, clubbing, or cyanosis. No calf tenderness, but has multiple healing ulcers. Strong bilateral pedal pulses.   NEUROLOGY: Alert and oriented x3. No focal deficits. Sensation intact. Power 5/5. Deep tendon reflexes 2+.   SKIN: There is some rash on the abdomen, which is chronic, and some ulcers on bilateral legs.   LABS/STUDIES: WBC 19.9, hemoglobin 13.2, platelets 476. Glucose 287, BUN 34, creatinine 1.03, sodium 134, potassium 4.8, bicarbonate 31, chloride 94. Lipase 37. Anion gap  9.   Urinalysis shows WBC 629, RBC 24, and nitrate negative.   CAT scan of the abdomen and pelvis without contrast showed no evidence of bowel obstruction,  nor evidence of an inflamed appendix. There are stones in the lower pole of the right kidney.  There is very mild hydroureter.  There may be a 1 mm diameter stone or stone fragment just proximal to the UVJ, on the right. Ureteral stent is no longer in place, on the right.   IMPRESSION:  1. Urinary tract infection.  2. Nephrolithiasis.  3. Leukocytosis.  4. Dehydration.  5. Diabetes.  PLAN OF TREATMENT:  1. The patient will be admitted to the medical floor. We will start pain management with Dilaudid 0.5 mg IV every 3 to 4 hours p.r.n. and also give Zofran p.r.n. for nausea and vomiting.  2. We will give IV fluid support for dehydration.  3. We will start Rocephin for urinary tract infection and send for CBC and urine culture. Followup BNP. 4. For uncontrolled diabetes, we will start sliding scale and give Lantus 10 units subcutaneous and adjust the dose depending on Accu-Chek. In addition, we will check hemoglobin A1c.  5. GI and deep vein thrombosis prophylaxis.         I discussed the patient's situation and the plan of treatment with the patient and the patient's sister. They voiced understanding and agreed to the current treatment plan.   TIME SPENT: Approximately 55 minutes.  ____________________________ Demetrios Loll, MD qc:slb D: 10/28/2011 19:14:05 ET T: 10/29/2011 06:59:30 ET JOB#: [id number]  cc: Demetrios Loll, MD, <Dictator> A. Lavone Orn, MD Demetrios Loll MD ELECTRONICALLY SIGNED 10/30/2011 16:58

## 2015-01-08 NOTE — Consult Note (Signed)
PATIENT NAME:  Maurice Davidson, Maurice Davidson MR#:  [id number] DATE OF BIRTH:  Aug 23, 1986  DATE OF CONSULTATION:  10/28/2011  REFERRING PHYSICIAN:  Marta Antu, MD CONSULTING PHYSICIAN:  Otelia Limes. Yves Dill, MD  REASON FOR CONSULTATION: Possible kidney stone.  HISTORY OF PRESENT ILLNESS: Mr. Holness is a 29 year old Caucasian male who presented to the Emergency Room with low mid abdominal pain associated with nausea and vomiting. He underwent noncontrast CT scan which revealed a questionable 1 mm distal right stone. There were also several small 1 mm stones in the region of the right kidney.   The patient does have a history of stone disease and had lithotripsy with stent placement several months ago. The stent has since been removed. He has a long history of abdominal pain and gastroparesis with neuropathy.   ALLERGIES: No known drug allergies.   CHRONIC MEDICATIONS: Insulin, Reglan, something for acid reflux, and Beano.  PAST SURGICAL HISTORY: Stent placement and lithotripsy.   SOCIAL HISTORY: The patient smokes 1/2 pack a day and has a 10 pack-year history. He has a history of abusing cocaine. He denied alcohol use.   FAMILY HISTORY: Remarkable for diabetes.   PAST AND CURRENT MEDICAL CONDITIONS:  1. Diabetes.  2. Gastroparesis. 3. Neuropathy. 4. History of substance abuse.   REVIEW OF SYSTEMS: The patient denied chest pain, heart disease, or stroke.   PHYSICAL EXAMINATION: Deferred.   PERTINENT LABS/STUDIES: Hematocrit is 40% with a white cell count of 19,900. BUN was 34 and creatinine was 1.03.   Urinalysis indicated 629 white blood cells per high-power field and 24 red cells per high-power field.   IMPRESSION:  1. Possible urinary tract infection.  2. Possible small right ureteral stone.  3. Right nephrolithiasis.  4. Abdominal pain.   PLAN:  1. It is doubtful that the abdominal pain is due to the kidney stones. 2. I would proceed with IV contrast enhanced CT scan to rule out ureteral  stone.  3. Treat the urinary tract infection.  ____________________________ Otelia Limes. Yves Dill, MD mrw:slb D: 10/28/2011 17:07:06 ET T: 10/29/2011 08:46:45 ET JOB#: [id number]  cc: Otelia Limes. Yves Dill, MD, <Dictator> Royston Cowper MD ELECTRONICALLY SIGNED 10/31/2011 9:47

## 2015-01-08 NOTE — Consult Note (Signed)
Brief Consult Note: Diagnosis: UTI. Abd. pain. R nephrolithiasis.   Patient was seen by consultant.   Consult note dictated.   Recommend further assessment or treatment.   Discussed with Attending MD.   Comments: Schedule Abd/pelvic CT w contrast to R/O ureteral stone. Treat UTI.  Electronic Signatures: Royston Cowper (MD)  (Signed 11-Feb-13 16:52)  Authored: Brief Consult Note   Last Updated: 11-Feb-13 16:52 by Royston Cowper (MD)

## 2015-01-08 NOTE — Discharge Summary (Signed)
PATIENT NAME:  Maurice Davidson, Maurice Davidson MR#:  [id number] DATE OF BIRTH:  1985/12/20  DATE OF ADMISSION:  10/28/2011 DATE OF DISCHARGE:  10/30/2011  DISCHARGE DIAGNOSES:  1. Urinary tract infection. 2. Nephrolithiasis, nonobstructive with prior history of lithotripsy and stent placement with subsequent removal.  3. Gastroparesis. 4. Neuropathy.  5. Polysubstance abuse, opioid, cocaine, smoking.  6. Leukocytosis.  7. Dehydration. 8. Anemia. 9. Diabetes. 10. Hypercalcemia. 11. Depression.   DISPOSITION:  1. The patient is being discharged home.  2. Follow-up with primary care physician, Dr. Gabriel Carina in 1 to 2 weeks after discharge.  3. Outpatient follow-up with psychiatrist has also been arranged.   DIET: Low sodium, 1,800 calorie ADA diet.   ACTIVITY: As tolerated.   DISCHARGE MEDICATIONS:  1. Lantus 8 units subcutaneous at bedtime.  2. Neurontin 300 mg q.i.d.  3. NovoLog insulin sliding scale.  4. Lisinopril 20 mg daily.  5. Bactrim DS 1 tablet p.o. b.i.d. for 10 days. 6. Reglan 5 mg q.i.d./at bedtime.  7. Paxil 10 milligrams daily.  8. Benadryl 25 mg at bedtime p.r.n. insomnia.  9. Phenergan 25 mg every six hours p.r.n. nausea and vomiting.   CONSULTATIONS:  1. Psychiatry consultation with Dr. Rhona Raider. 2. Urology consultation with Dr. Yves Dill.   LABORATORY, DIAGNOSTIC, AND RADIOLOGICAL DATA: CT scan of the abdomen showed stone in the lower pole of the right kidney, very mild hydroureter, may be possibility of 1 mm stone or stone fragment just proximal to the UV junction on the right. Urine culture grew Sphingomonas. CBC: White count ranging from 19.9 to 15.8, hemoglobin 13.2, platelets 476. Normal renal function. Glucose 287, calcium 10.7, hemoglobin A1c 8.4. ALT 145.   HOSPITAL COURSE: The patient is a 29 year old male with past medical history of chronic kidney stones, diabetes, gastroparesis, neuropathy, substance abuse, who presented with nausea, vomiting, and abdominal pain. A CT  of the abdomen showed that he had right-sided kidney stones which the patient says are chronic and also possibility of a stone fragment in the right UV junction. The patient was evaluated by Dr. Yves Dill during hospitalization who did not feel that the patient's abdominal pain was coming from his kidney stones. Urine cultures were sent and the patient was initially empirically treated with Rocephin. Once the final cultures came back, he was switched to Bactrim. He had mild leukocytosis and dehydration which have improved with IV fluid hydration. The patient's hemoglobin A1c is 8.6. He has insulin-dependent diabetes mellitus. He has been advised to continue ADA diet, Lantus and insulin sliding scale. He had mild hypercalcemia which was possibly felt to be due to dehydration. He had gastroparesis which was treated with Reglan. The patient reported that he was very depressed and had a history of polysubstance abuse. Therefore, a psychiatric consultation was obtained. Dr. Rhona Raider will follow the patient as an outpatient. He also recommended starting the patient on Benadryl p.r.n. at bedtime for insomnia and Paxil. By the time of discharge, the patient's abdominal pain had resolved. He was tolerating oral diet and his mood was more stable. He is being discharged home in a stable condition.   TIME SPENT: 45 minutes.  ____________________________ Cherre Huger, MD sp:ap D: 10/30/2011 17:07:51 ET               T: 10/31/2011 11:32:39 ET               JOB#: [id number] cc: Cherre Huger, MD, <Dictator> A. Lavone Orn, MD Cherre Huger MD ELECTRONICALLY SIGNED 10/31/2011 15:34

## 2015-01-08 NOTE — Consult Note (Signed)
see dictation  Electronic Signatures: Billie Ruddy (MD)  (Signed on 12-Feb-13 15:44)  Authored  Last Updated: 12-Feb-13 15:44 by Billie Ruddy (MD)

## 2015-01-08 NOTE — Consult Note (Signed)
PATIENT NAME:  Maurice Davidson, Maurice Davidson MR#:  [id number] DATE OF BIRTH:  [birth year]  DATE OF CONSULTATION:  10/29/2011  REFERRING PHYSICIAN:  Demetrios Loll, MD  CONSULTING PHYSICIAN:  Drue Stager. [place], MD  REASON FOR CONSULTATION: Depression and substance abuse.   HISTORY OF PRESENT ILLNESS: Maurice Davidson is a 29 year old male admitted to [facility] on February 11th with abdominal pain and vomiting.   Maurice Davidson has been experiencing chronic depression. This has worsened since he has been off his Paxil. He does have 50% reduction in interests, mild decreased concentration, insomnia, and moderate decreased energy for over six months. He has no thoughts of harming himself or others. He has no delusions or hallucinations. His memory and orientation function are intact.   Regarding recreation substance abuse and dependence, he has been clean from cocaine for greater than a year, however, he does acknowledge that he uses marijuana approximately 3 to 4 times a week. His stress continues to be chronic pain which includes gastroparesis pain and peripheral neuropathy pain as well as pain from a partially excreted kidney stone. He was in pain treatment which eventually led to requiring methadone. He was at 90 mg a day, now decreased to 30 mg a day. He states that he is motivated to come off all opioids if possible.   PAST PSYCHIATRIC HISTORY: Maurice Davidson developed major depression for the first time when he was 29 years old. He was placed on Paxil increased to 20 mg per day which was helpful. He has no history of increased energy, increased mood, decreased need for sleep, or increased goal-directed activity. He has no history of suicide attempts, hallucinations, or delusions. He has not been psychiatrically hospitalized.   FAMILY PSYCHIATRIC HISTORY: None known.   SOCIAL HISTORY: Maurice Davidson has been living with his parents, however, there is lack of support in the relationships. His grandmother recognizes the lack of  support and she is an advocate for the patient. She participates in the session today with the patient's permission. She confirms his history.   SUBSTANCE ABUSE: As above.   OCCUPATION: Medically disabled. Maurice Davidson continues to pay his own private health insurance.   PAST MEDICAL HISTORY:  1. Neuropathy.  2. Nephrolithiasis. 3. Diabetes. 4. Gastroparesis.   ALLERGIES: No known drug allergies.   MEDICATIONS: The MAR is reviewed. Maurice Davidson is on gabapentin 100 mg t.i.d. He is receiving Dilaudid 1 mg IV push q.3 to 4 hours p.r.n. pain. He is on nicotine patch.   LABORATORY DATA: CBC shows elevated WBC at 15.8. He is undergoing general medical care on the general medical floor at this time. His SGPT is normal. SGOT normal. Albumin mildly decreased at 3.4. BUN was increased at 34 but creatinine was 1.03 on the 11th.   REVIEW OF SYSTEMS: Constitutional, HEENT, mouth, neurologic, psychiatric, cardiovascular, respiratory, gastrointestinal, genitourinary, skin, musculoskeletal, hematologic, lymphatic, endocrine, metabolic all unremarkable.   PHYSICAL EXAMINATION:   VITAL SIGNS: Temperature 98.6, pulse 108, respiratory rate 120, blood pressure 182/112.   GENERAL: Mildly cachectic, mildly frail male with no abnormal involuntary movements appearing his chronologic age. His muscle tone appears to be mildly below average. He is mildly disheveled in grooming. Hygiene is normal.   MENTAL STATUS EXAMINATION: Maurice Davidson is alert. His eye contact is good. Concentration is mildly decreased. He is oriented to all spheres. Abstraction intact. Use of language, intelligence, and fund of knowledge are normal. Speech involves normal rate and prosody without dysarthria. Thought process logical, coherent, and goal directed with  no looseness of associations or tangents. Thought content no thoughts of harming himself or others. No delusions or hallucinations. Memory is intact to immediate, recent, and remote. Insight  intact. Judgment intact. Affect mildly anxious. Mood mildly depressed.    ASSESSMENT:  AXIS I:  1. Major depressive disorder, recurrent, moderate.  2. Polysubstance dependence.   AXIS II: Deferred.   AXIS III:  1. Hypertension. 2. Diabetes. 3. See past medical history.   AXIS IV: General, medical, primary support group.   AXIS V: 55.   Maurice Davidson is not at risk to harm himself or others. He agrees to call emergency services immediately for any thoughts of harming himself, thoughts of harming others, or distress.   The indications, alternatives, and adverse effects of Paxil and Benadryl were discussed with the patient. He understands and wants to proceed.   RECOMMENDATIONS:  1. Would proceed with Paxil 20 mg one-half p.o. daily for one week then increase as tolerated to 1 p.o. daily.  2. Benadryl 25 mg 1 to 2 p.o. at bedtime p.r.n. insomnia.  3. Pain Clinic.  4. 12-step groups.  5. Would recommend that he have psychiatric follow-up within the first week of discharge. The undersigned could follow him for strictly medication management, however, office availability may not be soon enough for the patient's needs.   6. Would have the staff contact the triage personnel in the Behavioral Medicine Emergency Department for assistance in setting up psychiatric outpatient disposition.   ____________________________ Drue Stager. Khye Hochstetler, MD jsw:drc D: 10/29/2011 15:43:37 ET T: 10/29/2011 15:55:58 ET JOB#: [id number] cc: Drue Stager. Virgal [place], MD, <Dictator> Billie Ruddy MD ELECTRONICALLY SIGNED 11/01/2011 20:02

## 2015-02-01 ENCOUNTER — Emergency Department
Admission: EM | Admit: 2015-02-01 | Discharge: 2015-02-01 | Disposition: A | Discharge status: Home / Self Care | Payer: Medicaid Other | Attending: Emergency Medicine | Admitting: Emergency Medicine

## 2015-02-01 ENCOUNTER — Other Ambulatory Visit: Payer: Self-pay

## 2015-02-01 ENCOUNTER — Emergency Department: Payer: Medicaid Other

## 2015-02-01 ENCOUNTER — Encounter: Payer: Self-pay | Admitting: Emergency Medicine

## 2015-02-01 DIAGNOSIS — Z794 Long term (current) use of insulin: Secondary | ICD-10-CM | POA: Insufficient documentation

## 2015-02-01 DIAGNOSIS — Z7982 Long term (current) use of aspirin: Secondary | ICD-10-CM | POA: Diagnosis not present

## 2015-02-01 DIAGNOSIS — R079 Chest pain, unspecified: Secondary | ICD-10-CM | POA: Diagnosis present

## 2015-02-01 DIAGNOSIS — K2289 Other specified disease of esophagus: Secondary | ICD-10-CM

## 2015-02-01 DIAGNOSIS — Z720 Tobacco use: Secondary | ICD-10-CM | POA: Insufficient documentation

## 2015-02-01 DIAGNOSIS — E119 Type 2 diabetes mellitus without complications: Secondary | ICD-10-CM | POA: Diagnosis not present

## 2015-02-01 DIAGNOSIS — Z792 Long term (current) use of antibiotics: Secondary | ICD-10-CM | POA: Diagnosis not present

## 2015-02-01 DIAGNOSIS — F1120 Opioid dependence, uncomplicated: Secondary | ICD-10-CM | POA: Diagnosis not present

## 2015-02-01 DIAGNOSIS — Z765 Malingerer [conscious simulation]: Secondary | ICD-10-CM | POA: Diagnosis not present

## 2015-02-01 DIAGNOSIS — Z79899 Other long term (current) drug therapy: Secondary | ICD-10-CM | POA: Diagnosis not present

## 2015-02-01 DIAGNOSIS — K228 Other specified diseases of esophagus: Secondary | ICD-10-CM

## 2015-02-01 HISTORY — DX: Other chronic pain: G89.29

## 2015-02-01 HISTORY — DX: Opioid dependence, uncomplicated: F11.20

## 2015-02-01 HISTORY — DX: Malingerer (conscious simulation): Z76.5

## 2015-02-01 LAB — CBC
HCT: 32.7 % — ABNORMAL LOW (ref 40.0–52.0)
HEMOGLOBIN: 10.7 g/dL — AB (ref 13.0–18.0)
MCH: 30.8 pg (ref 26.0–34.0)
MCHC: 32.8 g/dL (ref 32.0–36.0)
MCV: 93.9 fL (ref 80.0–100.0)
Platelets: 462 10*3/uL — ABNORMAL HIGH (ref 150–440)
RBC: 3.48 MIL/uL — AB (ref 4.40–5.90)
RDW: 16.3 % — ABNORMAL HIGH (ref 11.5–14.5)
WBC: 17.7 10*3/uL — ABNORMAL HIGH (ref 3.8–10.6)

## 2015-02-01 LAB — BASIC METABOLIC PANEL
ANION GAP: 6 (ref 5–15)
BUN: 24 mg/dL — ABNORMAL HIGH (ref 6–20)
CHLORIDE: 102 mmol/L (ref 101–111)
CO2: 28 mmol/L (ref 22–32)
Calcium: 9.3 mg/dL (ref 8.9–10.3)
Creatinine, Ser: 0.95 mg/dL (ref 0.61–1.24)
GFR calc Af Amer: >60 mL/min (ref >60)
GFR calc non Af Amer: >60 mL/min (ref >60)
Glucose, Bld: 199 mg/dL — ABNORMAL HIGH (ref 65–99)
Potassium: 5 mmol/L (ref 3.5–5.1)
Sodium: 136 mmol/L (ref 135–145)

## 2015-02-01 LAB — TROPONIN I

## 2015-02-01 MED ORDER — KETOROLAC TROMETHAMINE 10 MG PO TABS
ORAL_TABLET | ORAL | Status: AC
  Filled 2015-02-01: qty 1

## 2015-02-01 MED ORDER — KETOROLAC TROMETHAMINE 10 MG PO TABS: 10.0000 mg | ORAL_TABLET | Freq: Once | ORAL | Status: DC

## 2015-02-01 MED ORDER — GI COCKTAIL ~~LOC~~: 30.0000 mL | ORAL | Status: DC

## 2015-02-01 MED ORDER — GI COCKTAIL ~~LOC~~
ORAL | Status: AC
  Filled 2015-02-01: qty 30

## 2015-02-01 NOTE — ED Notes (Signed)
Pt was called for triage with no answer x1.

## 2015-02-01 NOTE — Discharge Instructions (Signed)
As we discussed, your evaluation was reassuring today with no evidence of acute illness or injury.  Please take your regular medications including your Suboxone which should help with the pain in your wisdom teeth as well as your esophagus.  If you do not already take an over-the-counter antacid such as Nexium or Prilosec, please try doing so.  Follow-up with your regular doctor at [facility] or with Dr. Candace Cruise, a gastroenterologist in [place], at the next available opportunity.

## 2015-02-01 NOTE — ED Provider Notes (Signed)
[facility] Emergency Department Provider Note  ____________________________________________  Time seen: Approximately 7:57 PM  I have reviewed the triage vital signs and the nursing notes.   HISTORY  Chief Complaint Chest Pain    HPI Maurice Davidson is a 29 y.o. male with a history of pain in his chest for more than 2 weeks after he eats and drinks where he feels like the "stuff is not going down".  He is also complaining of pain from his was temp teeth which were pulled several days ago.  He describes both pains as severe, with the chest pain being intermittent and the was some tooth pain being constant.  Of note, he did not tell me of his history of Suboxone which I found upon looking him up in the [place] controlled substance database.  The patient states that he has had similar problems swallowing before and has had an endoscopy previously.  His primary doctor is at [facility].  Of note, when he was originally called from the waiting room, he was not located.  He was subsequently found to be eating a full tray of food from the hospital cafeteria with no difficulty nor apparent distress.   Past Medical History  Diagnosis Date  . Diabetes mellitus without complication   . Seizures   . Renal disorder   . Anemia   . MRSA (methicillin resistant staph aureus) culture positive   . Sepsis   . Aspiration pneumonia   . H/O: GI bleed   . Gastroparesis   . Encephalopathy   . Chronic pain     take Suboxone    Patient Active Problem List   Diagnosis Date Noted  . Stomach pain 10/20/2013  . Thrombocytosis 10/10/2013  . Anxiety state, unspecified 10/10/2013  . Encephalopathy 10/07/2013  . Diabetes mellitus type 1 with ketoacidosis 10/07/2013  . MRSA bacteremia 10/07/2013  . Seizures 10/07/2013  . Aspiration pneumonia 10/07/2013  . Acute renal failure 10/07/2013  . Anemia of chronic disease 10/07/2013    Past Surgical History  Procedure Laterality  Date  . Orthopedic surgery      Current Outpatient Rx  Name  Route  Sig  Dispense  Refill  . ALPRAZolam (XANAX) 0.5 MG tablet   Oral   Take 1 mg by mouth 3 (three) times daily.          Marland Kitchen aspirin 81 MG chewable tablet   Oral   Chew 1 tablet (81 mg total) by mouth daily. Take with food   20 tablet   0   . enalapril (VASOTEC) 10 MG tablet   Oral   Take 10 mg by mouth daily.         . feeding supplement, GLUCERNA SHAKE, (GLUCERNA SHAKE) LIQD   Oral   Take 237 mLs by mouth 3 (three) times daily with meals.         . ferrous sulfate 325 (65 FE) MG tablet   Oral   Take 325 mg by mouth 2 (two) times daily with a meal.         . gabapentin (NEURONTIN) 300 MG capsule   Oral   Take 300 mg by mouth 4 (four) times daily.         . insulin aspart (NOVOLOG) 100 UNIT/ML injection   Subcutaneous   Inject 5 Units into the skin 3 (three) times daily with meals. Hold if CBG <150         . insulin detemir (LEVEMIR) 100 UNIT/ML injection  Subcutaneous   Inject 10 Units into the skin at bedtime.         Marland Kitchen omeprazole (PRILOSEC) 20 MG capsule   Oral   Take 20 mg by mouth daily.         Marland Kitchen oxyCODONE (OXY IR/ROXICODONE) 5 MG immediate release tablet      Take one tablet by mouth every 6 hours as needed for severe pain. Offer Tramadol as needed dose first.   120 tablet   0   . pantoprazole (PROTONIX) 40 MG tablet   Oral   Take 40 mg by mouth daily.         Marland Kitchen PARoxetine (PAXIL) 40 MG tablet   Oral   Take 40 mg by mouth daily.         . buprenorphine-naloxone (SUBOXONE) 8-2 MG SUBL SL tablet   Sublingual   Place 1 tablet under the tongue daily.         Marland Kitchen levETIRAcetam (KEPPRA) 500 MG tablet   Oral   Take 500 mg by mouth 2 (two) times daily.         Marland Kitchen levofloxacin (LEVAQUIN) 500 MG tablet   Oral   Take 500 mg by mouth daily.         Marland Kitchen lisinopril (PRINIVIL,ZESTRIL) 5 MG tablet   Oral   Take 5 mg by mouth daily.         . sodium chloride 0.9 %  SOLN 150 mL with vancomycin 1000 MG SOLR 750 mg   Intravenous   Inject 750 mg into the vein every 12 (twelve) hours.         . traMADol (ULTRAM) 50 MG tablet      Take one tablet by mouth every 4 hours as needed for pain   180 tablet   5     Allergies Abilify  Family History  Problem Relation Age of Onset  . Clotting disorder Sister     Von Willebrand's disease plus polycythemia vera    Social History History  Substance Use Topics  . Smoking status: Current Some Day Smoker  . Smokeless tobacco: Not on file  . Alcohol Use: No    Review of Systems Constitutional: No fever/chills Eyes: No visual changes. ENT: No sore throat.  Pain were his wisdom teeth were removed. Cardiovascular: Central chest pain as described above Respiratory: Denies shortness of breath. Gastrointestinal: No abdominal pain.  No nausea, no vomiting.  No diarrhea.  No constipation. Genitourinary: Negative for dysuria. Musculoskeletal: Negative for back pain. Skin: Negative for rash. Neurological: Negative for headaches, focal weakness or numbness.  10-point ROS otherwise negative.  ____________________________________________   PHYSICAL EXAM:  VITAL SIGNS: ED Triage Vitals  Enc Vitals Group     BP 02/01/15 1806 138/100 mmHg     Pulse Rate 02/01/15 1806 100     Resp 02/01/15 1806 18     Temp 02/01/15 1806 98.2 F (36.8 C)     Temp Source 02/01/15 1806 Oral     SpO2 02/01/15 1806 100 %     Weight 02/01/15 1806 120 lb (54.432 kg)     Height 02/01/15 1806 5\' 6"  (1.676 m)     Head Cir --      Peak Flow --      Pain Score 02/01/15 1807 7     Pain Loc --      Pain Edu? --      Excl. in [place]? --     Constitutional: Thin, no acute distress.  Alert and oriented. Eyes: Conjunctivae are normal. PERRL. EOMI. Head: Atraumatic. Nose: No congestion/rhinnorhea. Mouth/Throat: Poor dentition throughout.  No evidence of acute was some tooth extraction, no dry socket.  Mucous membranes are moist.   Oropharynx non-erythematous. Neck: No stridor.   Cardiovascular: Normal rate, regular rhythm. Grossly normal heart sounds.  Good peripheral circulation.  No reproducible chest wall pain Respiratory: Normal respiratory effort.  No retractions. Lungs CTAB. Gastrointestinal: Soft and nontender. No distention. No abdominal bruits. No CVA tenderness. Musculoskeletal: No lower extremity tenderness nor edema.  Wears braces on his legs. Neurologic:  Normal speech and language. No gross focal neurologic deficits are appreciated. Speech is normal. No gait instability. Skin:  Skin is warm, dry and intact. No rash noted. Psychiatric: Mood and affect are normal. Speech and behavior are normal.  ____________________________________________   LABS (all labs ordered are listed, but only abnormal results are displayed)  Labs Reviewed  CBC - Abnormal; Notable for the following:    WBC 17.7 (*)    RBC 3.48 (*)    Hemoglobin 10.7 (*)    HCT 32.7 (*)    RDW 16.3 (*)    Platelets 462 (*)    All other components within normal limits  BASIC METABOLIC PANEL - Abnormal; Notable for the following:    Glucose, Bld 199 (*)    BUN 24 (*)    All other components within normal limits  TROPONIN I   ____________________________________________  EKG  ED ECG REPORT   Date: 02/01/2015  EKG Time: 18:12  Rate: 98  Rhythm: normal sinus rhythm  Axis: Normal  Intervals:Normal  ST&T Change: Early repolarization, no evidence of acute ischemia  ____________________________________________  RADIOLOGY  Dg Chest 2 View  02/01/2015   CLINICAL DATA:  Subacute onset of mid chest pain for 2 weeks. Initial encounter.  EXAM: CHEST  2 VIEW  COMPARISON:  Chest radiograph performed 11/19/2013  FINDINGS: The lungs are well-aerated and clear. There is no evidence of focal opacification, pleural effusion or pneumothorax.  The heart is normal in size; the mediastinal contour is within normal limits. No acute osseous  abnormalities are seen. Nodular density at the left lung base is thought to reflect a confluence of osseous structures.  IMPRESSION: No acute cardiopulmonary process seen.   Electronically Signed   By: Garald Balding M.D.   On: 02/01/2015 19:10    ____________________________________________   PROCEDURES  Procedure(s) performed: None  Critical Care performed: No  ____________________________________________   INITIAL IMPRESSION / ASSESSMENT AND PLAN / ED COURSE  Pertinent labs & imaging results that were available during my care of the patient were reviewed by me and considered in my medical decision making (see chart for details).  Strongly suspect narcotics dependence given his undisclosed Suboxone history, the contradiction of his difficulty swallowing and pain in his mouth while being observed eating a tray of food in the lobby.  Labs only notable for leukocytosis.  His chemistry is normal.  I recommended that he follow up with GI for further evaluation of possible achalasia or esophageal spasm.  It was actually at that point that the patient told me about the wisdom tooth issue and asked for pain medicine.  I explained that according to the chronic pain policy that medication cannot be given for those reasons in the emergency department, but that I would provide him with a GI cocktail and nonnarcotic pain medication prior to his discharge.    Within minutes, when the nurse went to check on him, he  and his mother had left without their discharge paperwork.   ____________________________________________   FINAL CLINICAL IMPRESSION(S) / ED DIAGNOSES  Final diagnoses:  Pain of esophagus  Drug-seeking behavior  Uncomplicated opioid dependence     Hinda Kehr, MD 02/01/15 2239

## 2015-02-01 NOTE — ED Notes (Signed)
After talking with MD. Pt was medically cleared and left ED without discharge paperwork or waiting for medicine. Pt was told by MD what medicines where to be administered and that no narcotics would be given. Pt left after this was said.

## 2015-02-01 NOTE — ED Notes (Signed)
Entered pts room to administer pts medicines. Pt not in room. D/C papers on counter.

## 2015-02-01 NOTE — ED Notes (Signed)
Pt reports mid sternal chest pain started two weeks ago.  Pt noted to be eating food while waiting in lobby.

## 2015-02-01 NOTE — ED Notes (Signed)
Pt presents to ED with c/o chest pain for greater than 2 weeks. Pt reports the pain is most severe first thing i the morning and "when I eat or drink". Pt reports having 2 wisdom teethe pulled on Monday and has since run out of prescribed pain medications. Pt reports a medical h/x significant for Type I DM since the age of 65. Pt states the chest pain starts at the bottom of the breastbone and radiates up to the top. Pt reports some nausea and diaphoresis, but no vomiting. Pt is A&O, in NAD, with mother at bedside.

## 2015-06-02 ENCOUNTER — Emergency Department: Payer: Medicaid Other

## 2015-06-02 ENCOUNTER — Inpatient Hospital Stay
Admit: 2015-06-02 | Discharge: 2015-06-02 | Disposition: A | Discharge status: Home / Self Care | Payer: Medicaid Other | Attending: Internal Medicine | Admitting: Internal Medicine

## 2015-06-02 ENCOUNTER — Inpatient Hospital Stay: Payer: Medicaid Other

## 2015-06-02 ENCOUNTER — Inpatient Hospital Stay
Admission: EM | Admit: 2015-06-02 | Discharge: 2015-06-04 | DRG: 871 | Disposition: A | Discharge status: Home / Self Care | Payer: Medicaid Other | Attending: Internal Medicine | Admitting: Internal Medicine

## 2015-06-02 ENCOUNTER — Encounter: Payer: Self-pay | Admitting: Emergency Medicine

## 2015-06-02 DIAGNOSIS — F17210 Nicotine dependence, cigarettes, uncomplicated: Secondary | ICD-10-CM | POA: Diagnosis present

## 2015-06-02 DIAGNOSIS — E1065 Type 1 diabetes mellitus with hyperglycemia: Secondary | ICD-10-CM | POA: Diagnosis present

## 2015-06-02 DIAGNOSIS — Z794 Long term (current) use of insulin: Secondary | ICD-10-CM | POA: Diagnosis not present

## 2015-06-02 DIAGNOSIS — N189 Chronic kidney disease, unspecified: Secondary | ICD-10-CM | POA: Diagnosis present

## 2015-06-02 DIAGNOSIS — F1490 Cocaine use, unspecified, uncomplicated: Secondary | ICD-10-CM | POA: Diagnosis present

## 2015-06-02 DIAGNOSIS — Z8614 Personal history of Methicillin resistant Staphylococcus aureus infection: Secondary | ICD-10-CM

## 2015-06-02 DIAGNOSIS — I517 Cardiomegaly: Secondary | ICD-10-CM | POA: Diagnosis present

## 2015-06-02 DIAGNOSIS — E43 Unspecified severe protein-calorie malnutrition: Secondary | ICD-10-CM | POA: Diagnosis present

## 2015-06-02 DIAGNOSIS — H9190 Unspecified hearing loss, unspecified ear: Secondary | ICD-10-CM | POA: Diagnosis present

## 2015-06-02 DIAGNOSIS — R6520 Severe sepsis without septic shock: Secondary | ICD-10-CM | POA: Diagnosis not present

## 2015-06-02 DIAGNOSIS — N170 Acute kidney failure with tubular necrosis: Secondary | ICD-10-CM | POA: Diagnosis present

## 2015-06-02 DIAGNOSIS — J9690 Respiratory failure, unspecified, unspecified whether with hypoxia or hypercapnia: Secondary | ICD-10-CM

## 2015-06-02 DIAGNOSIS — F1290 Cannabis use, unspecified, uncomplicated: Secondary | ICD-10-CM | POA: Diagnosis present

## 2015-06-02 DIAGNOSIS — E1021 Type 1 diabetes mellitus with diabetic nephropathy: Secondary | ICD-10-CM | POA: Diagnosis present

## 2015-06-02 DIAGNOSIS — G253 Myoclonus: Secondary | ICD-10-CM | POA: Diagnosis present

## 2015-06-02 DIAGNOSIS — E861 Hypovolemia: Secondary | ICD-10-CM | POA: Diagnosis present

## 2015-06-02 DIAGNOSIS — I129 Hypertensive chronic kidney disease with stage 1 through stage 4 chronic kidney disease, or unspecified chronic kidney disease: Secondary | ICD-10-CM | POA: Diagnosis present

## 2015-06-02 DIAGNOSIS — E1022 Type 1 diabetes mellitus with diabetic chronic kidney disease: Secondary | ICD-10-CM | POA: Diagnosis present

## 2015-06-02 DIAGNOSIS — G9341 Metabolic encephalopathy: Secondary | ICD-10-CM | POA: Diagnosis present

## 2015-06-02 DIAGNOSIS — J9601 Acute respiratory failure with hypoxia: Secondary | ICD-10-CM | POA: Diagnosis present

## 2015-06-02 DIAGNOSIS — J690 Pneumonitis due to inhalation of food and vomit: Secondary | ICD-10-CM | POA: Diagnosis present

## 2015-06-02 DIAGNOSIS — Z681 Body mass index (BMI) 19 or less, adult: Secondary | ICD-10-CM | POA: Diagnosis not present

## 2015-06-02 DIAGNOSIS — A419 Sepsis, unspecified organism: Secondary | ICD-10-CM | POA: Diagnosis present

## 2015-06-02 DIAGNOSIS — Z8249 Family history of ischemic heart disease and other diseases of the circulatory system: Secondary | ICD-10-CM | POA: Diagnosis not present

## 2015-06-02 DIAGNOSIS — N179 Acute kidney failure, unspecified: Secondary | ICD-10-CM | POA: Diagnosis not present

## 2015-06-02 DIAGNOSIS — F329 Major depressive disorder, single episode, unspecified: Secondary | ICD-10-CM | POA: Diagnosis present

## 2015-06-02 DIAGNOSIS — Z7982 Long term (current) use of aspirin: Secondary | ICD-10-CM | POA: Diagnosis not present

## 2015-06-02 DIAGNOSIS — G8929 Other chronic pain: Secondary | ICD-10-CM | POA: Diagnosis present

## 2015-06-02 DIAGNOSIS — E1043 Type 1 diabetes mellitus with diabetic autonomic (poly)neuropathy: Secondary | ICD-10-CM | POA: Diagnosis present

## 2015-06-02 DIAGNOSIS — F419 Anxiety disorder, unspecified: Secondary | ICD-10-CM | POA: Diagnosis present

## 2015-06-02 DIAGNOSIS — K3184 Gastroparesis: Secondary | ICD-10-CM | POA: Diagnosis present

## 2015-06-02 DIAGNOSIS — J189 Pneumonia, unspecified organism: Secondary | ICD-10-CM | POA: Diagnosis not present

## 2015-06-02 DIAGNOSIS — Z888 Allergy status to other drugs, medicaments and biological substances status: Secondary | ICD-10-CM

## 2015-06-02 DIAGNOSIS — Z8619 Personal history of other infectious and parasitic diseases: Secondary | ICD-10-CM | POA: Diagnosis present

## 2015-06-02 DIAGNOSIS — Z79899 Other long term (current) drug therapy: Secondary | ICD-10-CM | POA: Diagnosis not present

## 2015-06-02 LAB — BLOOD GAS, VENOUS
Acid-Base Excess: 2.7 mmol/L (ref 0.0–3.0)
BICARBONATE: 28.9 meq/L — AB (ref 21.0–28.0)
FIO2: 0.21
O2 Saturation: 57.2 %
PATIENT TEMPERATURE: 37
PCO2 VEN: 50 mmHg (ref 44.0–60.0)
PH VEN: 7.37 (ref 7.320–7.430)
PO2 VEN: 31 mmHg (ref 30.0–45.0)

## 2015-06-02 LAB — BASIC METABOLIC PANEL
Anion gap: 11 (ref 5–15)
Anion gap: 11 (ref 5–15)
Anion gap: 9 (ref 5–15)
BUN: 54 mg/dL — AB (ref 6–20)
BUN: 46 mg/dL — AB (ref 6–20)
BUN: 37 mg/dL — AB (ref 6–20)
CO2: 25 mmol/L (ref 22–32)
CO2: 24 mmol/L (ref 22–32)
CO2: 25 mmol/L (ref 22–32)
CREATININE: 2.58 mg/dL — AB (ref 0.61–1.24)
CREATININE: 1.95 mg/dL — AB (ref 0.61–1.24)
CREATININE: 1.56 mg/dL — AB (ref 0.61–1.24)
Calcium: 7.5 mg/dL — ABNORMAL LOW (ref 8.9–10.3)
Calcium: 7.7 mg/dL — ABNORMAL LOW (ref 8.9–10.3)
Calcium: 7.7 mg/dL — ABNORMAL LOW (ref 8.9–10.3)
Chloride: 98 mmol/L — ABNORMAL LOW (ref 101–111)
Chloride: 102 mmol/L (ref 101–111)
Chloride: 103 mmol/L (ref 101–111)
GFR calc Af Amer: >60 mL/min (ref >60)
GFR, EST AFRICAN AMERICAN: 37 mL/min — AB (ref >60)
GFR, EST AFRICAN AMERICAN: 52 mL/min — AB (ref >60)
GFR, EST NON AFRICAN AMERICAN: 32 mL/min — AB (ref >60)
GFR, EST NON AFRICAN AMERICAN: 45 mL/min — AB (ref >60)
GFR, EST NON AFRICAN AMERICAN: 59 mL/min — AB (ref >60)
Glucose, Bld: 236 mg/dL — ABNORMAL HIGH (ref 65–99)
Glucose, Bld: 161 mg/dL — ABNORMAL HIGH (ref 65–99)
Glucose, Bld: 178 mg/dL — ABNORMAL HIGH (ref 65–99)
POTASSIUM: 4.2 mmol/L (ref 3.5–5.1)
POTASSIUM: 4 mmol/L (ref 3.5–5.1)
Potassium: 4.4 mmol/L (ref 3.5–5.1)
SODIUM: 134 mmol/L — AB (ref 135–145)
SODIUM: 137 mmol/L (ref 135–145)
SODIUM: 137 mmol/L (ref 135–145)

## 2015-06-02 LAB — URINE DRUG SCREEN, QUALITATIVE (ARMC ONLY)
AMPHETAMINES, UR SCREEN: NOT DETECTED
Barbiturates, Ur Screen: NOT DETECTED
Benzodiazepine, Ur Scrn: POSITIVE — AB
Cannabinoid 50 Ng, Ur ~~LOC~~: POSITIVE — AB
Cocaine Metabolite,Ur ~~LOC~~: POSITIVE — AB
MDMA (ECSTASY) UR SCREEN: NOT DETECTED
METHADONE SCREEN, URINE: NOT DETECTED
Opiate, Ur Screen: NOT DETECTED
Phencyclidine (PCP) Ur S: NOT DETECTED
Tricyclic, Ur Screen: POSITIVE — AB

## 2015-06-02 LAB — URINALYSIS COMPLETE WITH MICROSCOPIC (ARMC ONLY)
BILIRUBIN URINE: NEGATIVE
Glucose, UA: NEGATIVE mg/dL
HGB URINE DIPSTICK: NEGATIVE
Leukocytes, UA: NEGATIVE
Nitrite: NEGATIVE
Protein, ur: 30 mg/dL — AB
Specific Gravity, Urine: 1.023 (ref 1.005–1.030)
pH: 5 (ref 5.0–8.0)

## 2015-06-02 LAB — CBC
HEMATOCRIT: 37.4 % — AB (ref 40.0–52.0)
Hemoglobin: 12 g/dL — ABNORMAL LOW (ref 13.0–18.0)
MCH: 30 pg (ref 26.0–34.0)
MCHC: 32 g/dL (ref 32.0–36.0)
MCV: 93.6 fL (ref 80.0–100.0)
Platelets: 440 10*3/uL (ref 150–440)
RBC: 3.99 MIL/uL — ABNORMAL LOW (ref 4.40–5.90)
RDW: 14.2 % (ref 11.5–14.5)
WBC: 14.4 10*3/uL — ABNORMAL HIGH (ref 3.8–10.6)

## 2015-06-02 LAB — SALICYLATE LEVEL: Salicylate Lvl: <4 mg/dL (ref 2.8–30.0)

## 2015-06-02 LAB — GLUCOSE, CAPILLARY
GLUCOSE-CAPILLARY: 138 mg/dL — AB (ref 65–99)
GLUCOSE-CAPILLARY: 131 mg/dL — AB (ref 65–99)
GLUCOSE-CAPILLARY: 164 mg/dL — AB (ref 65–99)
GLUCOSE-CAPILLARY: 156 mg/dL — AB (ref 65–99)
GLUCOSE-CAPILLARY: 178 mg/dL — AB (ref 65–99)
Glucose-Capillary: 229 mg/dL — ABNORMAL HIGH (ref 65–99)
Glucose-Capillary: 181 mg/dL — ABNORMAL HIGH (ref 65–99)
Glucose-Capillary: 182 mg/dL — ABNORMAL HIGH (ref 65–99)
Glucose-Capillary: 135 mg/dL — ABNORMAL HIGH (ref 65–99)
Glucose-Capillary: 173 mg/dL — ABNORMAL HIGH (ref 65–99)
Glucose-Capillary: 161 mg/dL — ABNORMAL HIGH (ref 65–99)

## 2015-06-02 LAB — BLOOD GAS, ARTERIAL
ACID-BASE EXCESS: 1.7 mmol/L (ref 0.0–3.0)
Allens test (pass/fail): POSITIVE — AB
Bicarbonate: 26.6 mEq/L (ref 21.0–28.0)
FIO2: 1
O2 SAT: 94.8 %
PATIENT TEMPERATURE: 37
PCO2 ART: 42 mmHg (ref 32.0–48.0)
PO2 ART: 74 mmHg — AB (ref 83.0–108.0)
pH, Arterial: 7.41 (ref 7.350–7.450)

## 2015-06-02 LAB — COMPREHENSIVE METABOLIC PANEL
ALT: 12 U/L — ABNORMAL LOW (ref 17–63)
ANION GAP: 16 — AB (ref 5–15)
AST: 25 U/L (ref 15–41)
Albumin: 3.7 g/dL (ref 3.5–5.0)
Alkaline Phosphatase: 86 U/L (ref 38–126)
BILIRUBIN TOTAL: 0.6 mg/dL (ref 0.3–1.2)
BUN: 64 mg/dL — ABNORMAL HIGH (ref 6–20)
CO2: 30 mmol/L (ref 22–32)
Calcium: 8.8 mg/dL — ABNORMAL LOW (ref 8.9–10.3)
Chloride: 90 mmol/L — ABNORMAL LOW (ref 101–111)
Creatinine, Ser: 3.57 mg/dL — ABNORMAL HIGH (ref 0.61–1.24)
GFR, EST AFRICAN AMERICAN: 25 mL/min — AB (ref >60)
GFR, EST NON AFRICAN AMERICAN: 22 mL/min — AB (ref >60)
Glucose, Bld: 238 mg/dL — ABNORMAL HIGH (ref 65–99)
Potassium: 4.3 mmol/L (ref 3.5–5.1)
Sodium: 136 mmol/L (ref 135–145)
TOTAL PROTEIN: 8.1 g/dL (ref 6.5–8.1)

## 2015-06-02 LAB — MRSA PCR SCREENING: MRSA by PCR: POSITIVE — AB

## 2015-06-02 LAB — AMMONIA: Ammonia: 17 umol/L (ref 9–35)

## 2015-06-02 LAB — ACETAMINOPHEN LEVEL: Acetaminophen (Tylenol), Serum: <10 ug/mL — ABNORMAL LOW (ref 10–30)

## 2015-06-02 LAB — HEMOGLOBIN A1C: Hgb A1c MFr Bld: 9.4 % — ABNORMAL HIGH (ref 4.0–6.0)

## 2015-06-02 LAB — PROCALCITONIN: PROCALCITONIN: 2.94 ng/mL

## 2015-06-02 LAB — LACTIC ACID, PLASMA: Lactic Acid, Venous: 1.7 mmol/L (ref 0.5–2.0)

## 2015-06-02 MED ORDER — PIPERACILLIN-TAZOBACTAM 3.375 G IVPB
3.3750 g | Freq: Three times a day (TID) | INTRAVENOUS | Status: DC
  Administered 2015-06-02 – 2015-06-04 (×5): 3.375 g via INTRAVENOUS
  Filled 2015-06-02 (×10): qty 50

## 2015-06-02 MED ORDER — ACETAMINOPHEN 325 MG PO TABS: 650.0000 mg | ORAL_TABLET | Freq: Four times a day (QID) | ORAL | Status: DC | PRN

## 2015-06-02 MED ORDER — ALBUTEROL SULFATE (2.5 MG/3ML) 0.083% IN NEBU
2.5000 mg | INHALATION_SOLUTION | Freq: Four times a day (QID) | RESPIRATORY_TRACT | Status: DC
  Administered 2015-06-02 – 2015-06-04 (×8): 2.5 mg via RESPIRATORY_TRACT
  Filled 2015-06-02 (×10): qty 3

## 2015-06-02 MED ORDER — SODIUM CHLORIDE 0.9 % IV BOLUS (SEPSIS)
1000.0000 mL | Freq: Once | INTRAVENOUS | Status: AC
  Administered 2015-06-02: 1000 mL via INTRAVENOUS

## 2015-06-02 MED ORDER — PIPERACILLIN-TAZOBACTAM 3.375 G IVPB
3.3750 g | Freq: Once | INTRAVENOUS | Status: AC
  Administered 2015-06-02: 3.375 g via INTRAVENOUS
  Filled 2015-06-02: qty 50

## 2015-06-02 MED ORDER — VANCOMYCIN HCL 500 MG IV SOLR
500.0000 mg | INTRAVENOUS | Status: DC
  Administered 2015-06-03: 500 mg via INTRAVENOUS
  Filled 2015-06-02 (×2): qty 500

## 2015-06-02 MED ORDER — BUDESONIDE 0.25 MG/2ML IN SUSP
0.2500 mg | Freq: Four times a day (QID) | RESPIRATORY_TRACT | Status: DC
  Administered 2015-06-02 – 2015-06-04 (×6): 0.25 mg via RESPIRATORY_TRACT
  Filled 2015-06-02 (×6): qty 2

## 2015-06-02 MED ORDER — ALBUTEROL SULFATE (2.5 MG/3ML) 0.083% IN NEBU: 2.5000 mg | INHALATION_SOLUTION | RESPIRATORY_TRACT | Status: DC | PRN

## 2015-06-02 MED ORDER — SODIUM CHLORIDE 0.9 % IJ SOLN
3.0000 mL | Freq: Two times a day (BID) | INTRAMUSCULAR | Status: DC
  Administered 2015-06-02 – 2015-06-03 (×4): 3 mL via INTRAVENOUS

## 2015-06-02 MED ORDER — SODIUM CHLORIDE 0.9 % IV SOLN
500.0000 mg | Freq: Two times a day (BID) | INTRAVENOUS | Status: DC
  Administered 2015-06-02 – 2015-06-04 (×5): 500 mg via INTRAVENOUS
  Filled 2015-06-02 (×7): qty 5

## 2015-06-02 MED ORDER — DOCUSATE SODIUM 100 MG PO CAPS: 100.0000 mg | ORAL_CAPSULE | Freq: Two times a day (BID) | ORAL | Status: DC | PRN

## 2015-06-02 MED ORDER — ACETAMINOPHEN 325 MG PO TABS
650.0000 mg | ORAL_TABLET | Freq: Once | ORAL | Status: AC
  Administered 2015-06-02: 650 mg via ORAL
  Filled 2015-06-02: qty 2

## 2015-06-02 MED ORDER — POTASSIUM CHLORIDE 10 MEQ/100ML IV SOLN
10.0000 meq | INTRAVENOUS | Status: DC
  Administered 2015-06-02 (×2): 10 meq via INTRAVENOUS
  Filled 2015-06-02 (×2): qty 100

## 2015-06-02 MED ORDER — SENNA 8.6 MG PO TABS: 1.0000 | ORAL_TABLET | Freq: Every day | ORAL | Status: DC | PRN

## 2015-06-02 MED ORDER — SODIUM CHLORIDE 0.9 % IV SOLN
INTRAVENOUS | Status: DC
  Administered 2015-06-02: 1.2 [IU]/h via INTRAVENOUS
  Filled 2015-06-02: qty 2.5

## 2015-06-02 MED ORDER — DEXTROSE-NACL 5-0.45 % IV SOLN
INTRAVENOUS | Status: DC
  Administered 2015-06-02: 12:00:00 via INTRAVENOUS

## 2015-06-02 MED ORDER — SODIUM CHLORIDE 0.9 % IV SOLN: INTRAVENOUS | Status: DC

## 2015-06-02 MED ORDER — INSULIN ASPART 100 UNIT/ML ~~LOC~~ SOLN: 0.0000 [IU] | SUBCUTANEOUS | Status: DC

## 2015-06-02 MED ORDER — SODIUM CHLORIDE 0.9 % IV SOLN
INTRAVENOUS | Status: DC
  Administered 2015-06-02: 11:00:00 via INTRAVENOUS

## 2015-06-02 MED ORDER — HEPARIN SODIUM (PORCINE) 5000 UNIT/ML IJ SOLN
5000.0000 [IU] | Freq: Three times a day (TID) | INTRAMUSCULAR | Status: DC
  Administered 2015-06-02 – 2015-06-04 (×6): 5000 [IU] via SUBCUTANEOUS
  Filled 2015-06-02 (×6): qty 1

## 2015-06-02 MED ORDER — CETYLPYRIDINIUM CHLORIDE 0.05 % MT LIQD
7.0000 mL | Freq: Two times a day (BID) | OROMUCOSAL | Status: DC
  Administered 2015-06-02 – 2015-06-04 (×5): 7 mL via OROMUCOSAL

## 2015-06-02 MED ORDER — ALPRAZOLAM 1 MG PO TABS
1.0000 mg | ORAL_TABLET | Freq: Three times a day (TID) | ORAL | Status: DC | PRN
  Administered 2015-06-02 – 2015-06-04 (×4): 1 mg via ORAL
  Filled 2015-06-02 (×6): qty 1

## 2015-06-02 MED ORDER — CHLORHEXIDINE GLUCONATE CLOTH 2 % EX PADS
6.0000 | MEDICATED_PAD | Freq: Every day | CUTANEOUS | Status: DC
  Administered 2015-06-03 – 2015-06-04 (×2): 6 via TOPICAL

## 2015-06-02 MED ORDER — ACETAMINOPHEN 650 MG RE SUPP: 650.0000 mg | Freq: Four times a day (QID) | RECTAL | Status: DC | PRN

## 2015-06-02 MED ORDER — BUDESONIDE 0.25 MG/2ML IN SUSP
0.2500 mg | Freq: Four times a day (QID) | RESPIRATORY_TRACT | Status: DC
  Administered 2015-06-02: 0.25 mg via RESPIRATORY_TRACT
  Filled 2015-06-02 (×2): qty 2

## 2015-06-02 MED ORDER — PANTOPRAZOLE SODIUM 40 MG IV SOLR
40.0000 mg | INTRAVENOUS | Status: DC
  Administered 2015-06-02 – 2015-06-03 (×2): 40 mg via INTRAVENOUS
  Filled 2015-06-02 (×2): qty 40

## 2015-06-02 MED ORDER — MUPIROCIN 2 % EX OINT
1.0000 "application " | TOPICAL_OINTMENT | Freq: Two times a day (BID) | CUTANEOUS | Status: DC
  Administered 2015-06-02 – 2015-06-04 (×5): 1 via NASAL
  Filled 2015-06-02: qty 22

## 2015-06-02 MED ORDER — VANCOMYCIN HCL IN DEXTROSE 1-5 GM/200ML-% IV SOLN
1000.0000 mg | Freq: Once | INTRAVENOUS | Status: AC
  Administered 2015-06-02: 1000 mg via INTRAVENOUS
  Filled 2015-06-02: qty 200

## 2015-06-02 NOTE — Consult Note (Signed)
PULMONARY / CRITICAL CARE MEDICINE   Name: Maurice Davidson MRN: [id number] DOB: 08/20/1986    ADMISSION DATE:  06/02/2015 CONSULTATION DATE:  9/16   INITIAL PRESENTATION:  64 M type I diabetic smoker in chronically poor health who presented with 2-3 days of general malaise and chest congestion and then was brought to ED after famiy called EMS when they heard him fall and found him with depressed LOC. In ambulance and ED, he was very hypoxic requiring high flow O2  MAJOR EVENTS/TEST RESULTS: 9/16 CT head: NAD 9/16 Renal US: no hydronephrosis 9/16 Echocardiogram:   INDWELLING DEVICES::   MICRO DATA:  MRSA PCR 9/16 >> POS Blood 9/16 >>  Resp 9/16 >>  Urine legionella Ag 9/16 >>  Urine strep Ag 9/16 >>   ANTIMICROBIALS:  Vanc 9/16 >>  Pip-azo 9/16 >>     HISTORY OF PRESENT ILLNESS:  Pt's depressed LOC prohibits further history. History as above is provided by pt's mother  PAST MEDICAL HISTORY :   has a past medical history of Diabetes mellitus without complication; Seizures; Renal disorder; Anemia; MRSA (methicillin resistant staph aureus) culture positive; Sepsis; Aspiration pneumonia; H/O: GI bleed; Gastroparesis; Encephalopathy; Chronic pain; Drug-seeking behavior; and Opioid dependence.  has past surgical history that includes orthopedic surgery. Prior to Admission medications   Medication Sig Start Date End Date Taking? Authorizing Provider  ALPRAZolam Duanne Moron) 1 MG tablet Take 1 mg by mouth 3 (three) times daily as needed for anxiety.   Yes Historical Provider, MD  aspirin EC 81 MG tablet Take 81 mg by mouth daily.   Yes Historical Provider, MD  buprenorphine-naloxone (SUBOXONE) 8-2 MG SUBL SL tablet Place 1 tablet under the tongue daily.   Yes Historical Provider, MD  busPIRone (BUSPAR) 5 MG tablet Take 15 mg by mouth 2 (two) times daily.   Yes Historical Provider, MD  celecoxib (CELEBREX) 100 MG capsule Take 100 mg by mouth 2 (two) times daily.   Yes Historical Provider,  MD  diclofenac sodium (VOLTAREN) 1 % GEL Apply 2 g topically 4 (four) times daily.   Yes Historical Provider, MD  gabapentin (NEURONTIN) 300 MG capsule Take 300 mg by mouth 4 (four) times daily.   Yes Historical Provider, MD  insulin aspart (NOVOLOG) 100 UNIT/ML injection Inject 5 Units into the skin 3 (three) times daily with meals. Increase one unit per evert 12 carbs consumed  Hold if CBG <150   Yes Historical Provider, MD  insulin glargine (LANTUS) 100 UNIT/ML injection Inject 11 Units into the skin daily.   Yes Historical Provider, MD  metoCLOPramide (REGLAN) 10 MG tablet Take 10 mg by mouth 3 (three) times daily before meals.   Yes Historical Provider, MD  Multiple Vitamins-Minerals (MULTIVITAMIN WITH MINERALS) tablet Take 1 tablet by mouth daily.   Yes Historical Provider, MD  pantoprazole (PROTONIX) 20 MG tablet Take 20 mg by mouth 2 (two) times daily.   Yes Historical Provider, MD  sertraline (ZOLOFT) 50 MG tablet Take 150 mg by mouth daily.   Yes Historical Provider, MD  traZODone (DESYREL) 50 MG tablet Take 150 mg by mouth at bedtime.   Yes Historical Provider, MD   Allergies  Allergen Reactions  . Abilify [Aripiprazole] Rash    FAMILY HISTORY:  has no family status information on file.  SOCIAL HISTORY:  reports that he has been smoking.  He does not have any smokeless tobacco history on file. He reports that he uses illicit drugs. He reports that he does not drink  alcohol.   3 PPD smoker  REVIEW OF SYSTEMS:  Unable to obtain  SUBJECTIVE:   VITAL SIGNS: Temp:  [98.1 F (36.7 C)-101.4 F (38.6 C)] 98.1 F (36.7 C) (09/16 1017) Pulse Rate:  [105-131] 111 (09/16 1300) Resp:  [20-32] 27 (09/16 1300) BP: (118-143)/(75-98) 139/95 mmHg (09/16 1300) SpO2:  [85 %-100 %] 98 % (09/16 1300) FiO2 (%):  [100 %] 100 % (09/16 1017) HEMODYNAMICS:   VENTILATOR SETTINGS: Vent Mode:  [-]  FiO2 (%):  [100 %] 100 % INTAKE / OUTPUT:  Intake/Output Summary (Last 24 hours) at 06/02/15  1418 Last data filed at 06/02/15 1336  Gross per 24 hour  Intake  176.2 ml  Output    600 ml  Net -423.8 ml    PHYSICAL EXAMINATION: General: Very thin, mildly labored resp on NRB mask Neuro: RASS -2, + F/C, CNs intact, MAEs, DTRs symmetric HEENT: NCAT Cardiovascular: Reg, no M Lungs: mildly labored, no wheezes, R basilar crackles, bronchial BS in LLL Abdomen: soft, NT, +BS, no HSM Ext: no edema, warm  LABS:  CBC  Recent Labs Lab 06/02/15 0556  WBC 14.4*  HGB 12.0*  HCT 37.4*  PLT 440   Coag's No results for input(s): APTT, INR in the last 168 hours. BMET  Recent Labs Lab 06/02/15 0556 06/02/15 1040  NA 136 134*  K 4.3 4.2  CL 90* 98*  CO2 30 25  BUN 64* 54*  CREATININE 3.57* 2.58*  GLUCOSE 238* 236*   Electrolytes  Recent Labs Lab 06/02/15 0556 06/02/15 1040  CALCIUM 8.8* 7.5*   Sepsis Markers  Recent Labs Lab 06/02/15 0752 06/02/15 1040  LATICACIDVEN 1.7  --   PROCALCITON  --  2.94   ABG  Recent Labs Lab 06/02/15 0910  PHART 7.41  PCO2ART 42  PO2ART 74*   Liver Enzymes  Recent Labs Lab 06/02/15 0556  AST 25  ALT 12*  ALKPHOS 86  BILITOT 0.6  ALBUMIN 3.7   Cardiac Enzymes No results for input(s): TROPONINI, PROBNP in the last 168 hours. 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 and superimposed ATN, hypovolemia P:    Monitor BMET intermittently Monitor I/Os Correct electrolytes as indicated Renal service following  GASTROINTESTINAL A:   Diabetic gastroparesis Chronic PPI use prior to adm P:   SUP: IV PPI - change to PO when able NPO until cognition improves  HEMATOLOGIC A:  No issues P:  DVT px: SQ heparin Monitor CBC intermittently Transfuse as needed per usual guidelines  INFECTIOUS A:  Severe sepsis Pneumonia - Exam suggests LLL consolidation  P:   Monitor temp, WBC count Micro and abx as above  PCT algorithm  ENDOCRINE A:   DM type 1, poorly controlled P:   Glucommander protocol per primary team  NEUROLOGIC A:  Acute encephalopathy - likely septic, toxic-metabolic Poly substance abuse P:  RASS goal: 0 Avoid all sedating meds   FAMILY  - Updates: Mother updated @ bedside   Merton Border, MD PCCM service Mobile [phone] Pager [phone]  06/02/2015, 2:18 PM

## 2015-06-02 NOTE — ED Notes (Signed)
Patient placed on non-rebreather per Dr. Burlene Arnt.  O2 sats in 80s on Nasal Cannula.

## 2015-06-02 NOTE — ED Notes (Signed)
Pt brought to ER from home via [facility]. Pt was found down on the floor by his mother who had to help him up. Pt is very shaky, and in an agitated state. Per mother pt has hx of using illicit drugs but has not been out of the house this past week except to go to the Dr. And no has come into the house to see the pt. Pt requires everyone to speak very loudly to him.

## 2015-06-02 NOTE — Progress Notes (Addendum)
ANTIBIOTIC CONSULT NOTE - INITIAL  Pharmacy Consult for Vancomycin/Zosyn Indication: sepsis  Allergies  Allergen Reactions  . Abilify [Aripiprazole] Rash    Patient Measurements:  Ht: 66"   Wt: 49 kg Adjusted Body Weight: 49 kg  Vital Signs: Temp: 98.1 F (36.7 C) (09/16 1017) Temp Source: Axillary (09/16 1017) BP: 139/95 mmHg (09/16 1300) Pulse Rate: 111 (09/16 1300) Intake/Output from previous day:   Intake/Output from this shift: Total I/O In: 176.2 [I.V.:76.2; IV Piggyback:100] Out: -   Labs:  Recent Labs  06/02/15 0556 06/02/15 1040  WBC 14.4*  --   HGB 12.0*  --   PLT 440  --   CREATININE 3.57* 2.58*   CrCl cannot be calculated (Unknown ideal weight.). No results for input(s): VANCOTROUGH, VANCOPEAK, VANCORANDOM, GENTTROUGH, GENTPEAK, GENTRANDOM, TOBRATROUGH, TOBRAPEAK, TOBRARND, AMIKACINPEAK, AMIKACINTROU, AMIKACIN in the last 72 hours.   Microbiology: Recent Results (from the past 720 hour(s))  MRSA PCR Screening     Status: Abnormal   Collection Time: 06/02/15 10:28 AM  Result Value Ref Range Status   MRSA by PCR POSITIVE (A) NEGATIVE Final    Comment:        The GeneXpert MRSA Assay (FDA approved for NASAL specimens only), is one component of a comprehensive MRSA colonization surveillance program. It is not intended to diagnose MRSA infection nor to guide or monitor treatment for MRSA infections. CRITICAL RESULT CALLED TO, READ BACK BY AND VERIFIED WITH: Venetia Night [facility] 06/02/15 1220 SJL     Medical History: Past Medical History  Diagnosis Date  . Diabetes mellitus without complication   . Seizures   . Renal disorder   . Anemia   . MRSA (methicillin resistant staph aureus) culture positive   . Sepsis   . Aspiration pneumonia   . H/O: GI bleed   . Gastroparesis   . Encephalopathy   . Chronic pain     take Suboxone  . Drug-seeking behavior   . Opioid dependence     Medications:  Scheduled:  . [START ON 06/03/2015]  Chlorhexidine Gluconate Cloth  6 each Topical Q0600  . heparin  5,000 Units Subcutaneous 3 times per day  . levETIRAcetam  500 mg Intravenous Q12H  . mupirocin ointment  1 application Nasal BID  . piperacillin-tazobactam (ZOSYN)  IV  3.375 g Intravenous 3 times per day  . potassium chloride  10 mEq Intravenous Q1H  . sodium chloride  3 mL Intravenous Q12H  . [START ON 06/03/2015] vancomycin  500 mg Intravenous Q24H   Infusions:  . sodium chloride    . dextrose 5 % and 0.45% NaCl 75 mL/hr at 06/02/15 1227  . insulin (NOVOLIN-R) infusion 1.2 Units/hr (06/02/15 1227)   PRN: acetaminophen **OR** acetaminophen, albuterol, docusate sodium, senna  Assessment: 29 y/o M with a h/o DM, CKD, and polysubstance abuse admitted with sepsis secondary to pneumonia.   Ke: 0.029 Vd: 34.3  Goal of Therapy:  Vancomycin trough level 15-20 mcg/ml  Plan:  1. Vancomycin 1000 mg iv once in ED then 500 mg iv q 24 hours with stacked dosing and a trough with the 4th dose.   2. Zosyn 3.375 g iv once then 3.375 g EI q 8 hours.   Will f/u renal function, culture results, and check levels as indicated.   [name] 06/02/2015,1:13 PM

## 2015-06-02 NOTE — ED Provider Notes (Signed)
-----------------------------------------   7:51 AM on 06/02/2015 -----------------------------------------  Signed out to me this morning at 7:15 AM. Patient with a history of polysubstance abuse and diabetes poor compliance and history of acute renal injury in the past presents today after having a cough and cold symptoms for the last several days. He became somewhat confused last night according to his parents. This morning they heard him fall and they brought him in. The patient himself cannot give a good history as he is somewhat confused. The antecedent physician started the patient on appropriate broad-spectrum antibiotics. Chest x-ray shows a pneumonia which is consistent with his exam, he does have rhonchi on the right. This time, he remains tachycardic and somewhat confused, he is nonfocal his neurologic exam, his lungs do show right-sided rhonchi, we are placing him on oxygen. He does have a fever, and is septic There does not appear to be evidence of DKA but there is acute renal injury. We are giving him copious IV fluids, I have discussed with the hospitalist service, they agree with patient management and will admit the patient. We are monitoring the patient very closely as he is quite ill. Urine drug screen is noted, multi polysubstance abuse possibly resulting in an aspiration pneumonia. CRITICAL CARE Performed by: Schuyler Amor   Total critical care time: 60  Critical care time was exclusive of separately billable procedures and treating other patients.  Critical care was necessary to treat or prevent imminent or life-threatening deterioration.  Critical care was time spent personally by me on the following activities: development of treatment plan with patient and/or surrogate as well as nursing, discussions with consultants, evaluation of patient's response to treatment, examination of patient, obtaining history from patient or surrogate, ordering and performing treatments and  interventions, ordering and review of laboratory studies, ordering and review of radiographic studies, pulse oximetry and re-evaluation of patient's condition.   Schuyler Amor, MD 06/02/15 [phone]

## 2015-06-02 NOTE — ED Notes (Signed)
MD at bedside.  Dr. Andrey Farmer in room to assess patient for possible admission.

## 2015-06-02 NOTE — Progress Notes (Signed)
Inpatient Diabetes Program Recommendations  AACE/ADA: New Consensus Statement on Inpatient Glycemic Control (2015)  Target Ranges:  Prepandial:   less than 140 mg/dL      Peak postprandial:   less than 180 mg/dL (1-2 hours)      Critically ill patients:  140 - 180 mg/dL   Review of Glycemic Control  Diabetes history: Type 1 Outpatient Diabetes medications: unclear as documented.  Notes state Lantus 11 units qhs, Levemir 10 units qhs, Novolog 5 units tid - hold if blood sugars < 150mg /dl, Novolog 20 units tid with meals  Current orders for Inpatient glycemic control: Novolog 0-9 units q4h  Inpatient Diabetes Program Recommendations:  Spoke with Dr. Tressia Miners; initiating IV insulin via [place].  Gentry Fitz, RN, BA, MHA, CDE Diabetes Coordinator Inpatient Diabetes Program  [phone] (Team Pager) [phone] ([place]) 06/02/2015 10:38 AM

## 2015-06-02 NOTE — ED Provider Notes (Signed)
[facility] Emergency Department Provider Note  ____________________________________________  Time seen: 6:10 AM  I have reviewed the triage vital signs and the nursing notes.   HISTORY  Chief Complaint Chest Pain and Abdominal Pain      HPI Maurice Davidson is a 29 y.o. male presents with history per EMS of being found down on the floor by his mother. On EMS arrival patient very tremulous and agitated. Patient's mother states that he has a history of using illicit drug use however he has not been out of the house and approximately a week except to visit his physician. In addition the patient mother states that he has had a cough and congestion for the past few days. She stated that she heard a "thud and found him on the floor minimally responsive this morning. Per EMS on their arrival the patient was hypoxic with oxygen saturation in the mid 80s.     Past Medical History  Diagnosis Date  . Diabetes mellitus without complication   . Seizures   . Renal disorder   . Anemia   . MRSA (methicillin resistant staph aureus) culture positive   . Sepsis   . Aspiration pneumonia   . H/O: GI bleed   . Gastroparesis   . Encephalopathy   . Chronic pain     take Suboxone  . Drug-seeking behavior   . Opioid dependence     Patient Active Problem List   Diagnosis Date Noted  . Stomach pain 10/20/2013  . Thrombocytosis 10/10/2013  . Anxiety state, unspecified 10/10/2013  . Encephalopathy 10/07/2013  . Diabetes mellitus type 1 with ketoacidosis 10/07/2013  . MRSA bacteremia 10/07/2013  . Seizures 10/07/2013  . Aspiration pneumonia 10/07/2013  . Acute renal failure 10/07/2013  . Anemia of chronic disease 10/07/2013    Past Surgical History  Procedure Laterality Date  . Orthopedic surgery      Current Outpatient Rx  Name  Route  Sig  Dispense  Refill  . ALPRAZolam (XANAX) 0.5 MG tablet   Oral   Take 1 mg by mouth 3 (three) times daily.          Marland Kitchen  aspirin 81 MG chewable tablet   Oral   Chew 1 tablet (81 mg total) by mouth daily. Take with food   20 tablet   0   . buprenorphine-naloxone (SUBOXONE) 8-2 MG SUBL SL tablet   Sublingual   Place 1 tablet under the tongue daily.         . enalapril (VASOTEC) 10 MG tablet   Oral   Take 10 mg by mouth daily.         . feeding supplement, GLUCERNA SHAKE, (GLUCERNA SHAKE) LIQD   Oral   Take 237 mLs by mouth 3 (three) times daily with meals.         . ferrous sulfate 325 (65 FE) MG tablet   Oral   Take 325 mg by mouth 2 (two) times daily with a meal.         . gabapentin (NEURONTIN) 300 MG capsule   Oral   Take 300 mg by mouth 4 (four) times daily.         . insulin aspart (NOVOLOG) 100 UNIT/ML injection   Subcutaneous   Inject 5 Units into the skin 3 (three) times daily with meals. Hold if CBG <150         . insulin detemir (LEVEMIR) 100 UNIT/ML injection   Subcutaneous   Inject 10 Units  into the skin at bedtime.         . levETIRAcetam (KEPPRA) 500 MG tablet   Oral   Take 500 mg by mouth 2 (two) times daily.         Marland Kitchen levofloxacin (LEVAQUIN) 500 MG tablet   Oral   Take 500 mg by mouth daily.         Marland Kitchen lisinopril (PRINIVIL,ZESTRIL) 5 MG tablet   Oral   Take 5 mg by mouth daily.         Marland Kitchen omeprazole (PRILOSEC) 20 MG capsule   Oral   Take 20 mg by mouth daily.         Marland Kitchen oxyCODONE (OXY IR/ROXICODONE) 5 MG immediate release tablet      Take one tablet by mouth every 6 hours as needed for severe pain. Offer Tramadol as needed dose first.   120 tablet   0   . pantoprazole (PROTONIX) 40 MG tablet   Oral   Take 40 mg by mouth daily.         Marland Kitchen PARoxetine (PAXIL) 40 MG tablet   Oral   Take 40 mg by mouth daily.         . sodium chloride 0.9 % SOLN 150 mL with vancomycin 1000 MG SOLR 750 mg   Intravenous   Inject 750 mg into the vein every 12 (twelve) hours.         . traMADol (ULTRAM) 50 MG tablet      Take one tablet by mouth every 4  hours as needed for pain   180 tablet   5     Allergies Abilify  Family History  Problem Relation Age of Onset  . Clotting disorder Sister     Von Willebrand's disease plus polycythemia vera    Social History Social History  Substance Use Topics  . Smoking status: Current Some Day Smoker  . Smokeless tobacco: Not on file  . Alcohol Use: No    Review of Systems  Constitutional: Negative for fever. Eyes: Negative for visual changes. ENT: Negative for sore throat. Cardiovascular: Negative for chest pain. Respiratory: Positive for cough and shortness of breath Gastrointestinal: Negative for abdominal pain, vomiting and diarrhea. Genitourinary: Negative for dysuria. Musculoskeletal: Negative for back pain. Skin: Negative for rash. Neurological: Positive for altered mental status   10-point ROS otherwise negative.  ____________________________________________   PHYSICAL EXAM:  VITAL SIGNS: ED Triage Vitals  Enc Vitals Group     BP 06/02/15 0612 118/75 mmHg     Pulse Rate 06/02/15 0612 131     Resp 06/02/15 0612 24     Temp 06/02/15 0612 99.2 F (37.3 C)     Temp Source 06/02/15 0612 Oral     SpO2 06/02/15 0612 95 %     Weight --      Height --      Head Cir --      Peak Flow --      Pain Score --      Pain Loc --      Pain Edu? --      Excl. in [place]? --     Constitutional: Alert but confused Eyes: Conjunctivae are normal. PERRL. Normal extraocular movements. ENT   Head: Normocephalic and atraumatic.   Nose: No congestion/rhinnorhea.   Mouth/Throat: Dry oral mucosa   Neck: No stridor. Hematological/Lymphatic/Immunilogical: No cervical lymphadenopathy. Cardiovascular: Normal rate, regular rhythm. Normal and symmetric distal pulses are present in all extremities. No murmurs, rubs, or  gallops. Respiratory: Right basilar rhonchi noted.. Gastrointestinal: Soft and nontender. No distention. There is no CVA tenderness. Genitourinary:  deferred Musculoskeletal: Nontender with normal range of motion in all extremities. No joint effusions.  No lower extremity tenderness nor edema. Neurologic:  Normal speech and language. No gross focal neurologic deficits are appreciated. Speech is normal.  Skin:  Skin is warm, dry and intact. No rash noted. Psychiatric: Mood and affect are normal. Speech and behavior are normal. Patient exhibits appropriate insight and judgment.  ____________________________________________    LABS (pertinent positives/negatives)  Labs Reviewed  MRSA PCR SCREENING - Abnormal; Notable for the following:    MRSA by PCR POSITIVE (*)    All other components within normal limits  CBC - Abnormal; Notable for the following:    WBC 14.4 (*)    RBC 3.99 (*)    Hemoglobin 12.0 (*)    HCT 37.4 (*)    All other components within normal limits  COMPREHENSIVE METABOLIC PANEL - Abnormal; Notable for the following:    Chloride 90 (*)    Glucose, Bld 238 (*)    BUN 64 (*)    Creatinine, Ser 3.57 (*)    Calcium 8.8 (*)    ALT 12 (*)    GFR calc non Af Amer 22 (*)    GFR calc Af Amer 25 (*)    Anion gap 16 (*)    All other components within normal limits  URINE DRUG SCREEN, QUALITATIVE ([facility] ONLY) - Abnormal; Notable for the following:    Tricyclic, Ur Screen POSITIVE (*)    Cocaine Metabolite,Ur [place] POSITIVE (*)    Cannabinoid 50 Ng, Ur [place] POSITIVE (*)    Benzodiazepine, Ur Scrn POSITIVE (*)    All other components within normal limits  URINALYSIS COMPLETEWITH MICROSCOPIC ([facility] ONLY) - Abnormal; Notable for the following:    Color, Urine AMBER (*)    APPearance HAZY (*)    Ketones, ur TRACE (*)    Protein, ur 30 (*)    Bacteria, UA RARE (*)    Squamous Epithelial / LPF 0-5 (*)    All other components within normal limits  BLOOD GAS, VENOUS - Abnormal; Notable for the following:    Bicarbonate 28.9 (*)    All other components within normal limits  BLOOD GAS, ARTERIAL - Abnormal; Notable for the  following:    pO2, Arterial 74 (*)    Allens test (pass/fail) POSITIVE (*)    All other components within normal limits  HEMOGLOBIN A1C - Abnormal; Notable for the following:    Hgb A1c MFr Bld 9.4 (*)    All other components within normal limits  ACETAMINOPHEN LEVEL - Abnormal; Notable for the following:    Acetaminophen (Tylenol), Serum <10 (*)    All other components within normal limits  GLUCOSE, CAPILLARY - Abnormal; Notable for the following:    Glucose-Capillary 229 (*)    All other components within normal limits  BASIC METABOLIC PANEL - Abnormal; Notable for the following:    Sodium 134 (*)    Chloride 98 (*)    Glucose, Bld 236 (*)    BUN 54 (*)    Creatinine, Ser 2.58 (*)    Calcium 7.5 (*)    GFR calc non Af Amer 32 (*)    GFR calc Af Amer 37 (*)    All other components within normal limits  BASIC METABOLIC PANEL - Abnormal; Notable for the following:    Glucose, Bld 161 (*)  BUN 46 (*)    Creatinine, Ser 1.95 (*)    Calcium 7.7 (*)    GFR calc non Af Amer 45 (*)    GFR calc Af Amer 52 (*)    All other components within normal limits  BASIC METABOLIC PANEL - Abnormal; Notable for the following:    Glucose, Bld 178 (*)    BUN 37 (*)    Creatinine, Ser 1.56 (*)    Calcium 7.7 (*)    GFR calc non Af Amer 59 (*)    All other components within normal limits  GLUCOSE, CAPILLARY - Abnormal; Notable for the following:    Glucose-Capillary 181 (*)    All other components within normal limits  GLUCOSE, CAPILLARY - Abnormal; Notable for the following:    Glucose-Capillary 182 (*)    All other components within normal limits  GLUCOSE, CAPILLARY - Abnormal; Notable for the following:    Glucose-Capillary 135 (*)    All other components within normal limits  GLUCOSE, CAPILLARY - Abnormal; Notable for the following:    Glucose-Capillary 138 (*)    All other components within normal limits  GLUCOSE, CAPILLARY - Abnormal; Notable for the following:    Glucose-Capillary  131 (*)    All other components within normal limits  BASIC METABOLIC PANEL - Abnormal; Notable for the following:    Potassium 3.3 (*)    Glucose, Bld 193 (*)    BUN 24 (*)    Calcium 7.8 (*)    All other components within normal limits  CBC - Abnormal; Notable for the following:    WBC 14.4 (*)    RBC 3.31 (*)    Hemoglobin 10.0 (*)    HCT 30.9 (*)    All other components within normal limits  GLUCOSE, CAPILLARY - Abnormal; Notable for the following:    Glucose-Capillary 173 (*)    All other components within normal limits  GLUCOSE, CAPILLARY - Abnormal; Notable for the following:    Glucose-Capillary 164 (*)    All other components within normal limits  GLUCOSE, CAPILLARY - Abnormal; Notable for the following:    Glucose-Capillary 156 (*)    All other components within normal limits  GLUCOSE, CAPILLARY - Abnormal; Notable for the following:    Glucose-Capillary 161 (*)    All other components within normal limits  GLUCOSE, CAPILLARY - Abnormal; Notable for the following:    Glucose-Capillary 178 (*)    All other components within normal limits  BASIC METABOLIC PANEL - Abnormal; Notable for the following:    Glucose, Bld 216 (*)    BUN 29 (*)    Calcium 7.9 (*)    All other components within normal limits  GLUCOSE, CAPILLARY - Abnormal; Notable for the following:    Glucose-Capillary 187 (*)    All other components within normal limits  GLUCOSE, CAPILLARY - Abnormal; Notable for the following:    Glucose-Capillary 187 (*)    All other components within normal limits  GLUCOSE, CAPILLARY - Abnormal; Notable for the following:    Glucose-Capillary 198 (*)    All other components within normal limits  GLUCOSE, CAPILLARY - Abnormal; Notable for the following:    Glucose-Capillary 210 (*)    All other components within normal limits  GLUCOSE, CAPILLARY - Abnormal; Notable for the following:    Glucose-Capillary 115 (*)    All other components within normal limits   GLUCOSE, CAPILLARY - Abnormal; Notable for the following:    Glucose-Capillary 211 (*)  All other components within normal limits  GLUCOSE, CAPILLARY - Abnormal; Notable for the following:    Glucose-Capillary 205 (*)    All other components within normal limits  GLUCOSE, CAPILLARY - Abnormal; Notable for the following:    Glucose-Capillary 223 (*)    All other components within normal limits  GLUCOSE, CAPILLARY - Abnormal; Notable for the following:    Glucose-Capillary 176 (*)    All other components within normal limits  GLUCOSE, CAPILLARY - Abnormal; Notable for the following:    Glucose-Capillary 145 (*)    All other components within normal limits  GLUCOSE, CAPILLARY - Abnormal; Notable for the following:    Glucose-Capillary 126 (*)    All other components within normal limits  GLUCOSE, CAPILLARY - Abnormal; Notable for the following:    Glucose-Capillary 174 (*)    All other components within normal limits  GLUCOSE, CAPILLARY - Abnormal; Notable for the following:    Glucose-Capillary 242 (*)    All other components within normal limits  GLUCOSE, CAPILLARY - Abnormal; Notable for the following:    Glucose-Capillary 289 (*)    All other components within normal limits  BASIC METABOLIC PANEL - Abnormal; Notable for the following:    Potassium 3.0 (*)    Chloride 100 (*)    Glucose, Bld 150 (*)    All other components within normal limits  GLUCOSE, CAPILLARY - Abnormal; Notable for the following:    Glucose-Capillary 492 (*)    All other components within normal limits  GLUCOSE, CAPILLARY - Abnormal; Notable for the following:    Glucose-Capillary 518 (*)    All other components within normal limits  GLUCOSE, CAPILLARY - Abnormal; Notable for the following:    Glucose-Capillary 374 (*)    All other components within normal limits  GLUCOSE, CAPILLARY - Abnormal; Notable for the following:    Glucose-Capillary 320 (*)    All other components within normal limits   GLUCOSE, CAPILLARY - Abnormal; Notable for the following:    Glucose-Capillary 406 (*)    All other components within normal limits  GLUCOSE, CAPILLARY - Abnormal; Notable for the following:    Glucose-Capillary 414 (*)    All other components within normal limits  GLUCOSE, CAPILLARY - Abnormal; Notable for the following:    Glucose-Capillary 502 (*)    All other components within normal limits  GLUCOSE, CAPILLARY - Abnormal; Notable for the following:    Glucose-Capillary 411 (*)    All other components within normal limits  CULTURE, BLOOD (ROUTINE X 2)  CULTURE, BLOOD (ROUTINE X 2)  CULTURE, EXPECTORATED SPUTUM-ASSESSMENT  CULTURE, RESPIRATORY (NON-EXPECTORATED)  C DIFFICILE QUICK SCREEN W PCR REFLEX  LACTIC ACID, PLASMA  SALICYLATE LEVEL  AMMONIA  PROCALCITONIN  L. PNEUMOPHILA SEROGP 1 UR AG  PROCALCITONIN  GLUCOSE, CAPILLARY  PROCALCITONIN         RADIOLOGY  CT Head Wo Contrast (Final result) Result time: 06/02/15 07:40:57   Final result by Rad Results In Interface (06/02/15 07:40:57)   Narrative:   CLINICAL DATA: Syncope.  EXAM: CT HEAD WITHOUT CONTRAST  TECHNIQUE: Contiguous axial images were obtained from the base of the skull through the vertex without intravenous contrast.  COMPARISON: September 29, 2013  FINDINGS: The ventricles are normal in size and configuration. There is no intracranial mass, hemorrhage, extra-axial fluid collection, or midline shift. Gray-white compartments are normal. No acute infarct evident. Bony calvarium appears intact. Visualized mastoid air cells are clear. There is opacification in the visualized superior left maxillary  antrum. There is mucosal thickening in several inferior ethmoid air cells as well.  IMPRESSION: Areas of paranasal sinus disease. No intracranial mass, hemorrhage, or extra-axial fluid collection. Gray-white compartments appear normal. No acute infarct evident.   Electronically Signed By:  Lowella Grip III M.D. On: 06/02/2015 07:40          DG Chest Portable 1 View (Final result) Result time: 06/02/15 06:32:01   Final result by Rad Results In Interface (06/02/15 06:32:01)   Narrative:   CLINICAL DATA: Syncope.  EXAM: PORTABLE CHEST - 1 VIEW  COMPARISON: 02/01/2015  FINDINGS: Interval enlargement of the cardiac silhouette, now at the upper limits normal in size. Mild vascular congestion. Ill-defined right basilar opacity. Mild hyperinflation and blunting of costophrenic angles. No pneumothorax. No acute osseous abnormalities.  IMPRESSION: 1. Interval enlargement of the cardiac silhouette, now at the upper limits of normal. Mild vascular congestion. 2. Ill-defined right basilar opacity, atelectasis versus pneumonia.   Electronically Signed By: Jeb Levering M.D. On: 06/02/2015 06:32      Critical care: CRITICAL CARE Performed by: Marjean Donna N   Total critical care time: 46minutes  Critical care time was exclusive of separately billable procedures and treating other patients.  Critical care was necessary to treat or prevent imminent or life-threatening deterioration.  Critical care was time spent personally by me on the following activities: development of treatment plan with patient and/or surrogate as well as nursing, discussions with consultants, evaluation of patient's response to treatment, examination of patient, obtaining history from patient or surrogate, ordering and performing treatments and interventions, ordering and review of laboratory studies, ordering and review of radiographic studies, pulse oximetry and re-evaluation of patient's condition.     INITIAL IMPRESSION / ASSESSMENT AND PLAN / ED COURSE  Pertinent labs & imaging results that were available during my care of the patient were reviewed by me and considered in my medical decision making (see chart for details).  History of physical exam concerning  for aspiration pneumonia with resultant sepsis. As such patient received IV vancomycin and Zosyn as well as 30 MLS per kilogram of IV normal saline.  ____________________________________________   FINAL CLINICAL IMPRESSION(S) / ED DIAGNOSES  Final diagnoses:  Sepsis, due to unspecified organism  Community acquired pneumonia      Gregor Hams, MD 06/06/15 [phone]

## 2015-06-02 NOTE — Progress Notes (Addendum)
Notified Dr. Tressia Miners of Patient positive for MRSA- ordered protocol. Read results of ultrasound of kidneys.- No new orders.

## 2015-06-02 NOTE — Progress Notes (Signed)
Subjective:   Patient known to our practice from 2 years ago. He was lost to follow up Baseline Cr is 0.95 (May 2016) Admit Cr 3.57 Today's Cr improved to 1.95 Patient too somnolent  Did not answer any questions   Objective:  Vital signs in last 24 hours:  Temp:  [98.1 F (36.7 C)-101.4 F (38.6 C)] 98.1 F (36.7 C) (09/16 1017) Pulse Rate:  [105-131] 111 (09/16 1300) Resp:  [20-32] 27 (09/16 1300) BP: (118-143)/(75-98) 139/95 mmHg (09/16 1300) SpO2:  [85 %-100 %] 98 % (09/16 1300) FiO2 (%):  [100 %] 100 % (09/16 1017)  Weight change:  There were no vitals filed for this visit.  Intake/Output:    Intake/Output Summary (Last 24 hours) at 06/02/15 1345 Last data filed at 06/02/15 1336  Gross per 24 hour  Intake  176.2 ml  Output    600 ml  Net -423.8 ml     Physical Exam: General: Thin gentleman, laying in bed  HEENT Anicteric, moist oral mucus membranes  Neck supple  Pulm/lungs Normal effort, clear b/l  CVS/Heart Regular, no rub  Abdomen:  Soft, non tender,   Extremities: No edema  Neurologic: somnolent  Skin: No acute rashes  Access:        Basic Metabolic Panel:   Recent Labs Lab 06/02/15 0556 06/02/15 1040  NA 136 134*  K 4.3 4.2  CL 90* 98*  CO2 30 25  GLUCOSE 238* 236*  BUN 64* 54*  CREATININE 3.57* 2.58*  CALCIUM 8.8* 7.5*     CBC:  Recent Labs Lab 06/02/15 0556  WBC 14.4*  HGB 12.0*  HCT 37.4*  MCV 93.6  PLT 440      Microbiology:  Recent Results (from the past 720 hour(s))  MRSA PCR Screening     Status: Abnormal   Collection Time: 06/02/15 10:28 AM  Result Value Ref Range Status   MRSA by PCR POSITIVE (A) NEGATIVE Final    Comment:        The GeneXpert MRSA Assay (FDA approved for NASAL specimens only), is one component of a comprehensive MRSA colonization surveillance program. It is not intended to diagnose MRSA infection nor to guide or monitor treatment for MRSA infections. CRITICAL RESULT CALLED TO, READ  BACK BY AND VERIFIED WITH: Venetia Night LINDSAY 06/02/15 1220 SJL     Coagulation Studies: No results for input(s): LABPROT, INR in the last 72 hours.  Urinalysis:  Recent Labs  06/02/15 0627  COLORURINE AMBER*  LABSPEC 1.023  PHURINE 5.0  GLUCOSEU NEGATIVE  HGBUR NEGATIVE  BILIRUBINUR NEGATIVE  KETONESUR TRACE*  PROTEINUR 30*  NITRITE NEGATIVE  LEUKOCYTESUR NEGATIVE      Imaging: Ct Head Wo Contrast  06/02/2015   CLINICAL DATA:  Syncope.  EXAM: CT HEAD WITHOUT CONTRAST  TECHNIQUE: Contiguous axial images were obtained from the base of the skull through the vertex without intravenous contrast.  COMPARISON:  September 29, 2013  FINDINGS: The ventricles are normal in size and configuration. There is no intracranial mass, hemorrhage, extra-axial fluid collection, or midline shift. Gray-white compartments are normal. No acute infarct evident. Bony calvarium appears intact. Visualized mastoid air cells are clear. There is opacification in the visualized superior left maxillary antrum. There is mucosal thickening in several inferior ethmoid air cells as well.  IMPRESSION: Areas of paranasal sinus disease. No intracranial mass, hemorrhage, or extra-axial fluid collection. Gray-white compartments appear normal. No acute infarct evident.   Electronically Signed   By: Lowella Grip III M.D.  On: 06/02/2015 07:40   US Renal  06/02/2015   CLINICAL DATA:  29 year old with sepsis and acute renal failure.  EXAM: RENAL / URINARY TRACT ULTRASOUND COMPLETE  COMPARISON:  CT abdomen and pelvis 11/19/2013, 10/28/2011. No prior ultrasound.  FINDINGS: Right Kidney:  Length: Approximately 11.0 cm. Echogenic renal parenchyma. No hydronephrosis. Well-preserved cortex. No focal parenchymal abnormality.  Left Kidney:  Length: Approximately 12.0 cm. Mildly echogenic parenchyma. No hydronephrosis. Well-preserved cortex. No focal parenchymal abnormality. Very small lower pole calculus identified on CT in March, 2015  is no longer visible.  Bladder:  Normal for degree of bladder distention.  Other:  Minimal right upper quadrant ascites.  IMPRESSION: 1. Echogenic parenchyma involving both kidneys consistent with medical renal disease. 2. No evidence of hydronephrosis involving either kidney. 3. Minimal right upper quadrant ascites.   Electronically Signed   By: Evangeline Dakin M.D.   On: 06/02/2015 12:21   Dg Chest Portable 1 View  06/02/2015   CLINICAL DATA:  Syncope.  EXAM: PORTABLE CHEST - 1 VIEW  COMPARISON:  02/01/2015  FINDINGS: Interval enlargement of the cardiac silhouette, now at the upper limits normal in size. Mild vascular congestion. Ill-defined right basilar opacity. Mild hyperinflation and blunting of costophrenic angles. No pneumothorax. No acute osseous abnormalities.  IMPRESSION: 1. Interval enlargement of the cardiac silhouette, now at the upper limits of normal. Mild vascular congestion. 2. Ill-defined right basilar opacity, atelectasis versus pneumonia.   Electronically Signed   By: Jeb Levering M.D.   On: 06/02/2015 06:32     Medications:   . sodium chloride    . dextrose 5 % and 0.45% NaCl 75 mL/hr at 06/02/15 1227  . insulin (NOVOLIN-R) infusion 1.2 Units/hr (06/02/15 1227)   . antiseptic oral rinse  7 mL Mouth Rinse BID  . [START ON 06/03/2015] Chlorhexidine Gluconate Cloth  6 each Topical Q0600  . heparin  5,000 Units Subcutaneous 3 times per day  . levETIRAcetam  500 mg Intravenous Q12H  . mupirocin ointment  1 application Nasal BID  .  piperacillin-tazobactam (ZOSYN)  IV  3.375 g Intravenous 3 times per day  . sodium chloride  3 mL Intravenous Q12H  . [START ON 06/03/2015] vancomycin  500 mg Intravenous Q24H   acetaminophen **OR** acetaminophen, albuterol, docusate sodium, senna  Assessment/ Plan:  29 y.o. male with T1 DM, CKD, nephrotic proteinuria, Neuropathy, polysubstance abuse admitted for confusion and difficulty breathing  1. ARF, CKD unspecified - likely  combination of acute illness leading to ATN and volume depletion - S Cr is improving with iv hydration - would continue iv fluids until PO intake is adequate  2. DM with CKD and proteinuria - p/c ratio 2.4 gm - avoid ACE-i for now    LOS: 0 Cashlynn Yearwood 9/16/20161:45 PM

## 2015-06-02 NOTE — H&P (Signed)
[facility] NAME: Santonio Speakman    MR#:  [id number]  DATE OF BIRTH:  21-Nov-1985  DATE OF ADMISSION:  06/02/2015  PRIMARY CARE PHYSICIAN: Tomasita Morrow, MD   REQUESTING/REFERRING PHYSICIAN: Dr. Burlene Arnt  CHIEF COMPLAINT:   Chief Complaint  Patient presents with  . Chest Pain  . Abdominal Pain    HISTORY OF PRESENT ILLNESS:  Patrich Heinze  is a 29 y.o. male with a known history of type 1 diabetes mellitus, CKD with unknown baseline creatinine, gastroparesis, depression and anxiety, polysubstance abuse presents to the hospital from home secondary to confusion and fevers and difficulty breathing. Patient is very confused at this time, unable to provide any history. Most of the history is obtained from his parents at bedside. According to family, patient has been having symptoms of cold congestion as everybody in the family had these symptoms for the last 3-4 days. Yesterday patient did mention that he was not feeling good and was very fatigued and was mostly in bed. This morning around 5:00 they heard a thump and went in to check on him and found him on the floor. He was having shaky chills, confusion and breathing difficulty. So he was brought to the emergency room. He is noted to be in acute renal failure, sepsis from pneumonia. Hypoxic requiring nonrebreather mask at this time. Very confused. Blood pressure is stable, renal function with worsening creatinine. Chest x-ray showing right lower lobe pneumonia. Patient is critically ill and will be admitted to ICU  PAST MEDICAL HISTORY:   Past Medical History  Diagnosis Date  . Diabetes mellitus without complication   . Seizures   . Renal disorder   . Anemia   . MRSA (methicillin resistant staph aureus) culture positive   . Sepsis   . Aspiration pneumonia   . H/O: GI bleed   . Gastroparesis   . Encephalopathy   . Chronic pain     take Suboxone  . Drug-seeking behavior   .  Opioid dependence     PAST SURGICAL HISTORY:   Past Surgical History  Procedure Laterality Date  . Orthopedic surgery      SOCIAL HISTORY:   Social History  Substance Use Topics  . Smoking status: Current Some Day Smoker  . Smokeless tobacco: Not on file  . Alcohol Use: No  drug use- uses marijuana and cocaine  FAMILY HISTORY:   Family History  Problem Relation Age of Onset  . Clotting disorder Sister     Von Willebrand's disease plus polycythemia vera  . Hypertension Mother     DRUG ALLERGIES:   Allergies  Allergen Reactions  . Abilify [Aripiprazole] Rash    REVIEW OF SYSTEMS:   Review of Systems  Unable to perform ROS: critical illness    MEDICATIONS AT HOME:   Prior to Admission medications   Medication Sig Start Date End Date Taking? Authorizing Provider  acidophilus (RISAQUAD) CAPS capsule Take by mouth daily.   Yes Historical Provider, MD  ALPRAZolam Duanne Moron) 1 MG tablet Take 1 mg by mouth 3 (three) times daily as needed for anxiety.   Yes Historical Provider, MD  aspirin 81 MG chewable tablet Chew 1 tablet (81 mg total) by mouth daily. Take with food 10/08/13  Yes Tammy Triplett, PA-C  aspirin EC 81 MG tablet Take 81 mg by mouth daily.   Yes Historical Provider, MD  enalapril (VASOTEC) 2.5 MG tablet Take 2.5 mg by mouth daily.   Yes Historical  Provider, MD  feeding supplement, GLUCERNA SHAKE, (GLUCERNA SHAKE) LIQD Take 237 mLs by mouth 3 (three) times daily with meals.   Yes Historical Provider, MD  ferrous sulfate 325 (65 FE) MG tablet Take 325 mg by mouth 2 (two) times daily with a meal.   Yes Historical Provider, MD  gabapentin (NEURONTIN) 300 MG capsule Take 300 mg by mouth 4 (four) times daily.   Yes Historical Provider, MD  insulin aspart (NOVOLOG) 100 UNIT/ML injection Inject 5 Units into the skin 3 (three) times daily with meals. Hold if CBG <150   Yes Historical Provider, MD  insulin aspart (NOVOLOG) 100 UNIT/ML injection Inject 20 Units into the  skin 3 (three) times daily before meals.   Yes Historical Provider, MD  insulin glargine (LANTUS) 100 UNIT/ML injection Inject 11 Units into the skin daily.   Yes Historical Provider, MD  metoCLOPramide (REGLAN) 10 MG tablet Take 10 mg by mouth 3 (three) times daily before meals.   Yes Historical Provider, MD  Multiple Vitamins-Minerals (MULTIVITAMIN WITH MINERALS) tablet Take 1 tablet by mouth daily.   Yes Historical Provider, MD  norfloxacin (NOROXIN) 400 MG TABS tablet Take 400 mg by mouth 2 (two) times daily.   Yes Historical Provider, MD  ondansetron (ZOFRAN-ODT) 4 MG disintegrating tablet Take 4 mg by mouth every 8 (eight) hours as needed for nausea or vomiting.   Yes Historical Provider, MD  oxyCODONE (OXY IR/ROXICODONE) 5 MG immediate release tablet Take one tablet by mouth every 6 hours as needed for severe pain. Offer Tramadol as needed dose first. 10/11/13  Yes Mahima Pandey, MD  oxyCODONE-acetaminophen (PERCOCET/ROXICET) 5-325 MG per tablet Take by mouth every 4 (four) hours as needed for severe pain.   Yes Historical Provider, MD  pantoprazole (PROTONIX) 20 MG tablet Take 20 mg by mouth 2 (two) times daily.   Yes Historical Provider, MD  PARoxetine (PAXIL) 40 MG tablet Take 40 mg by mouth daily.   Yes Historical Provider, MD  sildenafil (VIAGRA) 50 MG tablet Take 50 mg by mouth daily as needed for erectile dysfunction.   Yes Historical Provider, MD  buprenorphine-naloxone (SUBOXONE) 8-2 MG SUBL SL tablet Place 1 tablet under the tongue daily.    Historical Provider, MD  insulin detemir (LEVEMIR) 100 UNIT/ML injection Inject 10 Units into the skin at bedtime.    Historical Provider, MD  levETIRAcetam (KEPPRA) 500 MG tablet Take 500 mg by mouth 2 (two) times daily.    Historical Provider, MD  levofloxacin (LEVAQUIN) 500 MG tablet Take 500 mg by mouth daily.    Historical Provider, MD  lisinopril (PRINIVIL,ZESTRIL) 5 MG tablet Take 5 mg by mouth daily.    Historical Provider, MD  omeprazole  (PRILOSEC) 20 MG capsule Take 20 mg by mouth daily.    Historical Provider, MD  pantoprazole (PROTONIX) 40 MG tablet Take 40 mg by mouth daily.    Historical Provider, MD  sodium chloride 0.9 % SOLN 150 mL with vancomycin 1000 MG SOLR 750 mg Inject 750 mg into the vein every 12 (twelve) hours.    Historical Provider, MD  traMADol (ULTRAM) 50 MG tablet Take one tablet by mouth every 4 hours as needed for pain 10/11/13   Blanchie Serve, MD      VITAL SIGNS:  Blood pressure 118/75, pulse 131, temperature 99.2 F (37.3 C), temperature source Oral, resp. rate 24, SpO2 95 %.  PHYSICAL EXAMINATION:   Physical Exam  GENERAL:  29 y.o.-year-old critical appearing patient lying in the bed, very  restless and confused.  EYES: Pupils equal, round, reactive to light and accommodation. No scleral icterus. Extraocular muscles intact.  HEENT: Head atraumatic, normocephalic. Oropharynx and nasopharynx clear.  Both ears examined-fluid in the middle ear, no acute otitis media NECK:  Supple, no jugular venous distention. No thyroid enlargement, no tenderness.  LUNGS: Coarse rhonchi bilaterally, moving air bilaterally, no wheezes or crackles heard. CARDIOVASCULAR: S1, S2 normal. No murmurs, rubs, or gallops. Rapid rate and regular rhythm ABDOMEN: Soft, nontender, nondistended. Bowel sounds present. No organomegaly or mass. Fullness in the lower abdomen noted EXTREMITIES: No pedal edema, cyanosis, or clubbing.  NEUROLOGIC: Moving all 4 extremities in bed. No focal weakness noted. Unable to do a complete neuro exam secondary to his mental status. Moderate Hearing loss noted which is new according to the family. Myoclonic jerks occasionally on exam PSYCHIATRIC: The patient is alert but not oriented.  SKIN: Old healing scabs from skin rashes seen.   LABORATORY PANEL:   CBC  Recent Labs Lab 06/02/15 0556  WBC 14.4*  HGB 12.0*  HCT 37.4*  PLT 440    ------------------------------------------------------------------------------------------------------------------  Chemistries   Recent Labs Lab 06/02/15 0556  NA 136  K 4.3  CL 90*  CO2 30  GLUCOSE 238*  BUN 64*  CREATININE 3.57*  CALCIUM 8.8*  AST 25  ALT 12*  ALKPHOS 86  BILITOT 0.6   ------------------------------------------------------------------------------------------------------------------  Cardiac Enzymes No results for input(s): TROPONINI in the last 168 hours. ------------------------------------------------------------------------------------------------------------------  RADIOLOGY:  Ct Head Wo Contrast  06/02/2015   CLINICAL DATA:  Syncope.  EXAM: CT HEAD WITHOUT CONTRAST  TECHNIQUE: Contiguous axial images were obtained from the base of the skull through the vertex without intravenous contrast.  COMPARISON:  September 29, 2013  FINDINGS: The ventricles are normal in size and configuration. There is no intracranial mass, hemorrhage, extra-axial fluid collection, or midline shift. Gray-white compartments are normal. No acute infarct evident. Bony calvarium appears intact. Visualized mastoid air cells are clear. There is opacification in the visualized superior left maxillary antrum. There is mucosal thickening in several inferior ethmoid air cells as well.  IMPRESSION: Areas of paranasal sinus disease. No intracranial mass, hemorrhage, or extra-axial fluid collection. Gray-white compartments appear normal. No acute infarct evident.   Electronically Signed   By: Lowella Grip III M.D.   On: 06/02/2015 07:40   Dg Chest Portable 1 View  06/02/2015   CLINICAL DATA:  Syncope.  EXAM: PORTABLE CHEST - 1 VIEW  COMPARISON:  02/01/2015  FINDINGS: Interval enlargement of the cardiac silhouette, now at the upper limits normal in size. Mild vascular congestion. Ill-defined right basilar opacity. Mild hyperinflation and blunting of costophrenic angles. No pneumothorax. No  acute osseous abnormalities.  IMPRESSION: 1. Interval enlargement of the cardiac silhouette, now at the upper limits of normal. Mild vascular congestion. 2. Ill-defined right basilar opacity, atelectasis versus pneumonia.   Electronically Signed   By: Jeb Levering M.D.   On: 06/02/2015 06:32    EKG:   Orders placed or performed during the hospital encounter of 02/01/15  . ED EKG (<25mins upon arrival to the ED)  . ED EKG (<59mins upon arrival to the ED)  . EKG    IMPRESSION AND PLAN:   29 year old male with type 1 diabetes, CK D, neuropathy, polysubstance abuse admitted for sepsis  #1 acute hypoxic respiratory failure-likely secondary to pneumonia -Aspiration cannot be ruled out -Continue nonrebreather mask, check stat ABG. Admit to ICU -Pulmonary consult -Chest x-ray with right lower lobe pneumonia, but coarse  rhonchi all over the lungs. -Follow up blood cultures. Empirically on vancomycin and Zosyn -Echo ordered for cardiomegaly noted on chest x-ray  #2 sepsis-secondary to pneumonia -Blood pressure is stable at this time. Follow-up blood cultures -Continue broad-spectrum antibiotics  #3 acute renal failure-according to family, patient has known CK D. Unknown creatinine at baseline. -Last labs from [facility] indicate creatinine of 2 in May 2016. However last creatinine in our system is normal in May 2016 -Currently creatinine > 3.5, retaining urine-420 mL on bladder scan -We'll place a Foley catheter for strict I's and O's monitoring -Renal ultrasound and nephrology consult - Gentle hydration  #4 metabolic encephalopathy-likely from sepsis and also renal failure -Tylenol and salicylate levels ordered especially with his breathing and also hearing loss noted for the last couple of days -Follow up ABG. CT head without any acute findings. -Myoclonic jerks observed, could be from his gabapentin not being cleared from his system due to renal failure. -If not improving will get a  neurology consult -We'll continue his Keppra, in IV form at this time. No obvious seizure activity noted  #5 polysubstance abuse-tox screen positive for benzos which patient is on at home. -Also positive for marijuana and cocaine. Family denies any use of IV drugs. No IV track marks seen  #6 diabetes mellitus type 1-hold his Lantus as patient will be nothing by mouth and just use sliding scale insulin  -A1c ordered  #7 hypertension-hold enalapril due to his renal failure and also sepsis with possible hypotension  #8 depression and anxiety-hold all his medications as currently very confused -Per PCP notes from 2 weeks ago, patient trying to use more xanax to deal with his stress.  #9 DVT prophylaxis-on subcutaneous heparin  Patient is critically ill and high risk for cardiorespiratory arrest. Family updated at bedside. We'll admit him to ICU   All the records are reviewed and case discussed with ED provider. Management plans discussed with the patient, family and they are in agreement.  CODE STATUS: Full code  TOTAL CRITICAL CARE TIME SPENT IN TAKING CARE OF THIS PATIENT: 65 minutes.    Gladstone Lighter M.D on 06/02/2015 at 8:34 AM  Between 7am to 6pm - Pager - [phone]  After 6pm go to [email or web address] - password [email or web address] [facility]  [facility]  Office  [phone]  CC: Primary care physician; Tomasita Morrow, MD

## 2015-06-02 NOTE — Progress Notes (Signed)
Patient ST in low 100's on monitor. Patient confused to situation- Pt lethargic but arousable- Patient has slept all shift.  On insulin drip which has been on hold past 2hrs due to protocol.  Anion gap closed but CO2 still high- METB to be rechecked at 6:30pm.Potassium replaced- Foley in place- urine output adequate.  Continues to be on non-rebreather at 100%- Tried to switch to 6 liters but patient will desat in low 80's.  Unable to complete adult profile due to patient's lethargy.  Patient positive for MRSA- nasal.

## 2015-06-02 NOTE — Progress Notes (Signed)
*  PRELIMINARY RESULTS* Echocardiogram 2D Echocardiogram has been performed.  Maurice Davidson Stills 06/02/2015, 7:01 PM

## 2015-06-03 ENCOUNTER — Inpatient Hospital Stay: Payer: Medicaid Other

## 2015-06-03 DIAGNOSIS — E43 Unspecified severe protein-calorie malnutrition: Secondary | ICD-10-CM | POA: Insufficient documentation

## 2015-06-03 LAB — GLUCOSE, CAPILLARY
GLUCOSE-CAPILLARY: 126 mg/dL — AB (ref 65–99)
GLUCOSE-CAPILLARY: 187 mg/dL — AB (ref 65–99)
GLUCOSE-CAPILLARY: 198 mg/dL — AB (ref 65–99)
GLUCOSE-CAPILLARY: 205 mg/dL — AB (ref 65–99)
GLUCOSE-CAPILLARY: 223 mg/dL — AB (ref 65–99)
GLUCOSE-CAPILLARY: 374 mg/dL — AB (ref 65–99)
GLUCOSE-CAPILLARY: 492 mg/dL — AB (ref 65–99)
GLUCOSE-CAPILLARY: 518 mg/dL — AB (ref 65–99)
GLUCOSE-CAPILLARY: 66 mg/dL (ref 65–99)
Glucose-Capillary: 211 mg/dL — ABNORMAL HIGH (ref 65–99)
Glucose-Capillary: 187 mg/dL — ABNORMAL HIGH (ref 65–99)
Glucose-Capillary: 210 mg/dL — ABNORMAL HIGH (ref 65–99)
Glucose-Capillary: 115 mg/dL — ABNORMAL HIGH (ref 65–99)
Glucose-Capillary: 145 mg/dL — ABNORMAL HIGH (ref 65–99)
Glucose-Capillary: 176 mg/dL — ABNORMAL HIGH (ref 65–99)
Glucose-Capillary: 174 mg/dL — ABNORMAL HIGH (ref 65–99)
Glucose-Capillary: 242 mg/dL — ABNORMAL HIGH (ref 65–99)
Glucose-Capillary: 289 mg/dL — ABNORMAL HIGH (ref 65–99)

## 2015-06-03 LAB — BASIC METABOLIC PANEL
ANION GAP: 8 (ref 5–15)
Anion gap: 6 (ref 5–15)
BUN: 24 mg/dL — AB (ref 6–20)
BUN: 29 mg/dL — ABNORMAL HIGH (ref 6–20)
CALCIUM: 7.8 mg/dL — AB (ref 8.9–10.3)
CALCIUM: 7.9 mg/dL — AB (ref 8.9–10.3)
CO2: 26 mmol/L (ref 22–32)
CO2: 29 mmol/L (ref 22–32)
CREATININE: 0.99 mg/dL (ref 0.61–1.24)
Chloride: 104 mmol/L (ref 101–111)
Chloride: 105 mmol/L (ref 101–111)
Creatinine, Ser: 1.23 mg/dL (ref 0.61–1.24)
GFR calc Af Amer: >60 mL/min (ref >60)
GFR calc Af Amer: >60 mL/min (ref >60)
GLUCOSE: 193 mg/dL — AB (ref 65–99)
GLUCOSE: 216 mg/dL — AB (ref 65–99)
POTASSIUM: 3.5 mmol/L (ref 3.5–5.1)
Potassium: 3.3 mmol/L — ABNORMAL LOW (ref 3.5–5.1)
SODIUM: 138 mmol/L (ref 135–145)
SODIUM: 140 mmol/L (ref 135–145)

## 2015-06-03 LAB — CBC
HEMATOCRIT: 30.9 % — AB (ref 40.0–52.0)
Hemoglobin: 10 g/dL — ABNORMAL LOW (ref 13.0–18.0)
MCH: 30.3 pg (ref 26.0–34.0)
MCHC: 32.4 g/dL (ref 32.0–36.0)
MCV: 93.4 fL (ref 80.0–100.0)
PLATELETS: 378 10*3/uL (ref 150–440)
RBC: 3.31 MIL/uL — ABNORMAL LOW (ref 4.40–5.90)
RDW: 14.1 % (ref 11.5–14.5)
WBC: 14.4 10*3/uL — ABNORMAL HIGH (ref 3.8–10.6)

## 2015-06-03 LAB — EXPECTORATED SPUTUM ASSESSMENT W REFEX TO RESP CULTURE

## 2015-06-03 LAB — PROCALCITONIN: PROCALCITONIN: 3.73 ng/mL

## 2015-06-03 MED ORDER — POTASSIUM CHLORIDE 10 MEQ/100ML IV SOLN
10.0000 meq | INTRAVENOUS | Status: AC
  Administered 2015-06-03 (×2): 10 meq via INTRAVENOUS
  Filled 2015-06-03 (×2): qty 100

## 2015-06-03 MED ORDER — INSULIN GLARGINE 100 UNIT/ML ~~LOC~~ SOLN
10.0000 [IU] | SUBCUTANEOUS | Status: DC
  Administered 2015-06-03: 10 [IU] via SUBCUTANEOUS
  Filled 2015-06-03 (×2): qty 0.1

## 2015-06-03 MED ORDER — INSULIN ASPART 100 UNIT/ML ~~LOC~~ SOLN
11.0000 [IU] | Freq: Once | SUBCUTANEOUS | Status: AC
  Administered 2015-06-03: 11 [IU] via SUBCUTANEOUS

## 2015-06-03 MED ORDER — ENALAPRIL MALEATE 10 MG PO TABS
10.0000 mg | ORAL_TABLET | Freq: Every day | ORAL | Status: DC
  Administered 2015-06-03 – 2015-06-04 (×2): 10 mg via ORAL
  Filled 2015-06-03 (×2): qty 1

## 2015-06-03 MED ORDER — VANCOMYCIN HCL 500 MG IV SOLR
500.0000 mg | Freq: Two times a day (BID) | INTRAVENOUS | Status: DC
  Administered 2015-06-03 – 2015-06-04 (×2): 500 mg via INTRAVENOUS
  Filled 2015-06-03 (×4): qty 500

## 2015-06-03 MED ORDER — GLUCERNA SHAKE PO LIQD: 237.0000 mL | Freq: Three times a day (TID) | ORAL | Status: DC

## 2015-06-03 MED ORDER — INSULIN ASPART 100 UNIT/ML ~~LOC~~ SOLN
0.0000 [IU] | Freq: Three times a day (TID) | SUBCUTANEOUS | Status: DC
  Administered 2015-06-03: 15 [IU] via SUBCUTANEOUS
  Administered 2015-06-04: 09:00:00 18 [IU] via SUBCUTANEOUS
  Administered 2015-06-04: 15 [IU] via SUBCUTANEOUS
  Filled 2015-06-03 (×3): qty 15

## 2015-06-03 MED ORDER — DEXTROSE 50 % IV SOLN
1.0000 | Freq: Once | INTRAVENOUS | Status: AC
  Administered 2015-06-03: 50 mL via INTRAVENOUS

## 2015-06-03 MED ORDER — GLUCERNA SHAKE PO LIQD
237.0000 mL | Freq: Three times a day (TID) | ORAL | Status: DC
  Administered 2015-06-03 (×2): 237 mL via ORAL

## 2015-06-03 MED ORDER — INSULIN GLARGINE 100 UNIT/ML ~~LOC~~ SOLN
15.0000 [IU] | Freq: Every day | SUBCUTANEOUS | Status: DC
  Administered 2015-06-04: 15 [IU] via SUBCUTANEOUS
  Filled 2015-06-03 (×2): qty 0.15

## 2015-06-03 MED ORDER — INSULIN GLARGINE 100 UNIT/ML ~~LOC~~ SOLN
5.0000 [IU] | Freq: Once | SUBCUTANEOUS | Status: AC
  Administered 2015-06-03: 5 [IU] via SUBCUTANEOUS
  Filled 2015-06-03: qty 0.05

## 2015-06-03 MED ORDER — INSULIN ASPART 100 UNIT/ML ~~LOC~~ SOLN
0.0000 [IU] | Freq: Three times a day (TID) | SUBCUTANEOUS | Status: DC
  Administered 2015-06-03: 5 [IU] via SUBCUTANEOUS
  Filled 2015-06-03: qty 5
  Filled 2015-06-03: qty 11

## 2015-06-03 MED ORDER — INSULIN ASPART 100 UNIT/ML ~~LOC~~ SOLN: 0.0000 [IU] | Freq: Three times a day (TID) | SUBCUTANEOUS | Status: DC

## 2015-06-03 MED ORDER — INSULIN ASPART 100 UNIT/ML ~~LOC~~ SOLN
3.0000 [IU] | Freq: Three times a day (TID) | SUBCUTANEOUS | Status: DC
  Administered 2015-06-03 – 2015-06-04 (×4): 3 [IU] via SUBCUTANEOUS
  Filled 2015-06-03 (×4): qty 3

## 2015-06-03 MED ORDER — DEXTROSE 50 % IV SOLN
INTRAVENOUS | Status: AC
  Administered 2015-06-03: 50 mL via INTRAVENOUS
  Filled 2015-06-03: qty 50

## 2015-06-03 NOTE — Progress Notes (Signed)
ANTIBIOTIC CONSULT NOTE - follow up  Pharmacy Consult for Vancomycin/Zosyn Indication: sepsis  Allergies  Allergen Reactions  . Abilify [Aripiprazole] Rash    Patient Measurements: Height: 5\' 6"  (167.6 cm) Weight: 108 lb 0.4 oz (49 kg) (49 kg) IBW/kg (Calculated) : 63.8Ht: 66"   Wt: 49 kg Adjusted Body Weight: 49 kg  Vital Signs: Temp: 98.3 F (36.8 C) (09/17 0400) Temp Source: Axillary (09/17 0400) BP: 146/101 mmHg (09/17 1300) Pulse Rate: 101 (09/17 1300)   Recent Labs  06/02/15 0556  06/02/15 1818 06/03/15 0010 06/03/15 0434  WBC 14.4*  --   --   --  14.4*  HGB 12.0*  --   --   --  10.0*  PLT 440  --   --   --  378  CREATININE 3.57*  < > 1.56* 1.23 0.99  < > = values in this interval not displayed. Estimated Creatinine Clearance: 76.3 mL/min (by C-G formula based on Cr of 0.99).  Microbiology: Recent Results (from the past 720 hour(s))  Blood culture (routine x 2)     Status: None (Preliminary result)   Collection Time: 06/02/15  6:46 AM  Result Value Ref Range Status   Specimen Description BLOOD LEFT ARM  Final   Special Requests   Final    BOTTLES DRAWN AEROBIC AND ANAEROBIC 5MLANAEROBIC, 11MLAEROBIC   Culture NO GROWTH < 24 HOURS  Final   Report Status PENDING  Incomplete  Blood culture (routine x 2)     Status: None (Preliminary result)   Collection Time: 06/02/15  6:47 AM  Result Value Ref Range Status   Specimen Description BLOOD RIGHT ANTECUBITAL  Final   Special Requests   Final    BOTTLES DRAWN AEROBIC AND ANAEROBIC 5MLAEROBIC,10MLANAEROBIC   Culture NO GROWTH < 24 HOURS  Final   Report Status PENDING  Incomplete  MRSA PCR Screening     Status: Abnormal   Collection Time: 06/02/15 10:28 AM  Result Value Ref Range Status   MRSA by PCR POSITIVE (A) NEGATIVE Final    Comment:        The GeneXpert MRSA Assay (FDA approved for NASAL specimens only), is one component of a comprehensive MRSA colonization surveillance program. It is not intended  to diagnose MRSA infection nor to guide or monitor treatment for MRSA infections. CRITICAL RESULT CALLED TO, READ BACK BY AND VERIFIED WITH: JOHANNA LINDSAY 06/02/15 1220 [facility]   Culture, expectorated sputum-assessment     Status: None   Collection Time: 06/03/15  8:20 AM  Result Value Ref Range Status   Specimen Description EXPECTORATED SPUTUM  Final   Special Requests NONE  Final   Sputum evaluation THIS SPECIMEN IS ACCEPTABLE FOR SPUTUM CULTURE  Final   Report Status 06/03/2015 FINAL  Final    Medical History: Past Medical History  Diagnosis Date  . Diabetes mellitus without complication   . Seizures   . Renal disorder   . Anemia   . MRSA (methicillin resistant staph aureus) culture positive   . Sepsis   . Aspiration pneumonia   . H/O: GI bleed   . Gastroparesis   . Encephalopathy   . Chronic pain     take Suboxone  . Drug-seeking behavior   . Opioid dependence     Medications:  Scheduled:  . albuterol  2.5 mg Nebulization Q6H  . antiseptic oral rinse  7 mL Mouth Rinse BID  . budesonide  0.25 mg Nebulization 4 times per day  . Chlorhexidine Gluconate Cloth  6 each Topical Q0600  . feeding supplement (GLUCERNA SHAKE)  237 mL Oral TID BM  . heparin  5,000 Units Subcutaneous 3 times per day  . insulin aspart  0-9 Units Subcutaneous TID WC  . insulin aspart  3 Units Subcutaneous TID WC  . insulin glargine  10 Units Subcutaneous Q24H  . levETIRAcetam  500 mg Intravenous Q12H  . mupirocin ointment  1 application Nasal BID  . pantoprazole (PROTONIX) IV  40 mg Intravenous Q24H  . piperacillin-tazobactam (ZOSYN)  IV  3.375 g Intravenous 3 times per day  . sodium chloride  3 mL Intravenous Q12H  . vancomycin  500 mg Intravenous Q12H   Infusions:    PRN: acetaminophen **OR** acetaminophen, albuterol, ALPRAZolam, docusate sodium, senna  Assessment: 29 y/o M with a h/o DM, CKD, and polysubstance abuse admitted with sepsis secondary to pneumonia.   Ke: 0.029 Vd:  34.3  Goal of Therapy:  Vancomycin trough level 15-20 mcg/ml  Plan:  1. Vancomycin 1000 mg iv once in ED then 500 mg iv q 24 hours with stacked dosing and a trough with the 4th dose.  2. Zosyn 3.375 g iv once then 3.375 g EI q 8 hours.   9/17: Scr improved. Will transition to Vancomycin 500 mg IV Q12h.  Ke 0.068  T1/2 10.19  Vd 34.3. Will order trough for 9/19 at 0430. Follow renal fxn.  Will f/u renal function, culture results, and check levels as indicated.   Chinita Greenland PharmD Clinical Pharmacist 06/03/2015 2:35 PM

## 2015-06-03 NOTE — Progress Notes (Signed)
Initial Nutrition Assessment  Severe malnutrition in context of chronic illness  INTERVENTION:  Meals and snacks: Cater to pt preferences Medical Nutrition Supplement Therapy: Recommend glucerna BID for added nutrition   NUTRITION DIAGNOSIS:   Inadequate oral intake related to acute illness as evidenced by NPO status.    GOAL:   Patient will meet greater than or equal to 90% of their needs    MONITOR:    (Energy intake, glucose profile)  REASON FOR ASSESSMENT:   Malnutrition Screening Tool    ASSESSMENT:      Pt admitted with DKA, pneumonia, acute respiratory failure, sepsis  Past Medical History  Diagnosis Date  . Diabetes mellitus without complication   . Seizures   . Renal disorder   . Anemia   . MRSA (methicillin resistant staph aureus) culture positive   . Sepsis   . Aspiration pneumonia   . H/O: GI bleed   . Gastroparesis   . Encephalopathy   . Chronic pain     take Suboxone  . Drug-seeking behavior   . Opioid dependence     Current Nutrition: NPO, diet just progressed this am, no tray yet during visit  Food/Nutrition-Related History: Pt reports good appetite prior to admission   Medications: colace, senna, aspart, lantus  Electrolyte/Renal Profile and Glucose Profile:   Recent Labs Lab 06/02/15 1818 06/03/15 0010 06/03/15 0434  NA 137 138 140  K 4.4 3.5 3.3*  CL 103 104 105  CO2 25 26 29   BUN 37* 29* 24*  CREATININE 1.56* 1.23 0.99  CALCIUM 7.7* 7.9* 7.8*  GLUCOSE 178* 216* 193*   Protein Profile:  Recent Labs Lab 06/02/15 0556  ALBUMIN 3.7    Gastrointestinal Profile: Last BM: 9/16   Nutrition-Focused Physical Exam Findings: Nutrition-Focused physical exam completed. Findings are mild/moderate to  severe fat depletion, mild/moderate to severe muscle depletion, and no edema.      Weight Change: stable wt over last year, wt encounters reviewed    Diet Order:  Diet Carb Modified Fluid consistency:: Thin; Room  service appropriate?: Yes  Skin:   reviewed Height:   Ht Readings from Last 1 Encounters:  06/02/15 5\' 6"  (1.676 m)    Weight:   Wt Readings from Last 1 Encounters:  06/02/15 108 lb 0.4 oz (49 kg)       BMI:  Body mass index is 17.44 kg/(m^2).  Estimated Nutritional Needs:   Kcal:  BEE 1397 kcals (IF 1.1-1.2, AF 1.2) 7829-5621 kcals/d  Protein:  (1.1-1.3 g/kg) 54-64 g/d  Fluid:  (30-88ml/kg) 1470-1715 kcals/d  EDUCATION NEEDS:   No education needs identified at this time  HIGH Care Level  Joli B. Zenia Resides, [place], [place] (pager)

## 2015-06-03 NOTE — Progress Notes (Signed)
Dr. Boyce Medici made aware of patient high blood [id number]) systolic and high blood [id number]- order for enalapril and changed lantus dose and gave 14 units of novolog. Patient had transitioned off insulin drip at noon. Vitals stable- patient on 3 liters of oxygen- NSR. Urine output adequate. MD Kasa made aware of multiple green stools patient is having- no new orders.

## 2015-06-03 NOTE — Progress Notes (Signed)
Subjective:   Patient known to our practice from 2 years ago. He was lost to follow up Baseline Cr is 0.95 (May 2016) Admit Cr 3.57 Today's Cr improved to 0.99 Patient  Alert this AM   Objective:  Vital signs in last 24 hours:  Temp:  [98.1 F (36.7 C)-101.4 F (38.6 C)] 98.3 F (36.8 C) (09/17 0400) Pulse Rate:  [89-116] 95 (09/17 0800) Resp:  [14-27] 15 (09/17 0800) BP: (104-154)/(61-102) 143/95 mmHg (09/17 0800) SpO2:  [92 %-100 %] 99 % (09/17 0800) FiO2 (%):  [100 %] 100 % (09/17 0150) Weight:  [49 kg (108 lb 0.4 oz)] 49 kg (108 lb 0.4 oz) (09/16 1017)  Weight change:  Filed Weights   06/02/15 1017  Weight: 49 kg (108 lb 0.4 oz)    Intake/Output:    Intake/Output Summary (Last 24 hours) at 06/03/15 7846 Last data filed at 06/03/15 0630  Gross per 24 hour  Intake 2961.2 ml  Output   2750 ml  Net  211.2 ml     Physical Exam: General: Thin gentleman, laying in bed  HEENT Anicteric, moist oral mucus membranes  Neck supple  Pulm/lungs Normal effort, clear b/l  CVS/Heart Regular, no rub  Abdomen:  Soft, non tender,   Extremities: No edema  Neurologic: A & O  Skin: No acute rashes  Access:        Basic Metabolic Panel:   Recent Labs Lab 06/02/15 1040 06/02/15 1423 06/02/15 1818 06/03/15 0010 06/03/15 0434  NA 134* 137 137 138 140  K 4.2 4.0 4.4 3.5 3.3*  CL 98* 102 103 104 105  CO2 25 24 25 26 29   GLUCOSE 236* 161* 178* 216* 193*  BUN 54* 46* 37* 29* 24*  CREATININE 2.58* 1.95* 1.56* 1.23 0.99  CALCIUM 7.5* 7.7* 7.7* 7.9* 7.8*     CBC:  Recent Labs Lab 06/02/15 0556 06/03/15 0434  WBC 14.4* 14.4*  HGB 12.0* 10.0*  HCT 37.4* 30.9*  MCV 93.6 93.4  PLT 440 378      Microbiology:  Recent Results (from the past 720 hour(s))  Blood culture (routine x 2)     Status: None (Preliminary result)   Collection Time: 06/02/15  6:46 AM  Result Value Ref Range Status   Specimen Description BLOOD LEFT ARM  Final   Special Requests   Final     BOTTLES DRAWN AEROBIC AND ANAEROBIC 5MLANAEROBIC, 11MLAEROBIC   Culture NO GROWTH < 24 HOURS  Final   Report Status PENDING  Incomplete  Blood culture (routine x 2)     Status: None (Preliminary result)   Collection Time: 06/02/15  6:47 AM  Result Value Ref Range Status   Specimen Description BLOOD RIGHT ANTECUBITAL  Final   Special Requests   Final    BOTTLES DRAWN AEROBIC AND ANAEROBIC 5MLAEROBIC,10MLANAEROBIC   Culture NO GROWTH < 24 HOURS  Final   Report Status PENDING  Incomplete  MRSA PCR Screening     Status: Abnormal   Collection Time: 06/02/15 10:28 AM  Result Value Ref Range Status   MRSA by PCR POSITIVE (A) NEGATIVE Final    Comment:        The GeneXpert MRSA Assay (FDA approved for NASAL specimens only), is one component of a comprehensive MRSA colonization surveillance program. It is not intended to diagnose MRSA infection nor to guide or monitor treatment for MRSA infections. CRITICAL RESULT CALLED TO, READ BACK BY AND VERIFIED WITH: JOHANNA LINDSAY 06/02/15 1220 SJ[facility]  Coagulation Studies: No results for input(s): LABPROT, INR in the last 72 hours.  Urinalysis:  Recent Labs  06/02/15 0627  COLORURINE AMBER*  LABSPEC 1.023  PHURINE 5.0  GLUCOSEU NEGATIVE  HGBUR NEGATIVE  BILIRUBINUR NEGATIVE  KETONESUR TRACE*  PROTEINUR 30*  NITRITE NEGATIVE  LEUKOCYTESUR NEGATIVE      Imaging: Ct Head Wo Contrast  06/02/2015   CLINICAL DATA:  Syncope.  EXAM: CT HEAD WITHOUT CONTRAST  TECHNIQUE: Contiguous axial images were obtained from the base of the skull through the vertex without intravenous contrast.  COMPARISON:  September 29, 2013  FINDINGS: The ventricles are normal in size and configuration. There is no intracranial mass, hemorrhage, extra-axial fluid collection, or midline shift. Gray-white compartments are normal. No acute infarct evident. Bony calvarium appears intact. Visualized mastoid air cells are clear. There is opacification in the visualized  superior left maxillary antrum. There is mucosal thickening in several inferior ethmoid air cells as well.  IMPRESSION: Areas of paranasal sinus disease. No intracranial mass, hemorrhage, or extra-axial fluid collection. Gray-white compartments appear normal. No acute infarct evident.   Electronically Signed   By: Lowella Grip III M.D.   On: 06/02/2015 07:40   US Renal  06/02/2015   CLINICAL DATA:  29 year old with sepsis and acute renal failure.  EXAM: RENAL / URINARY TRACT ULTRASOUND COMPLETE  COMPARISON:  CT abdomen and pelvis 11/19/2013, 10/28/2011. No prior ultrasound.  FINDINGS: Right Kidney:  Length: Approximately 11.0 cm. Echogenic renal parenchyma. No hydronephrosis. Well-preserved cortex. No focal parenchymal abnormality.  Left Kidney:  Length: Approximately 12.0 cm. Mildly echogenic parenchyma. No hydronephrosis. Well-preserved cortex. No focal parenchymal abnormality. Very small lower pole calculus identified on CT in March, 2015 is no longer visible.  Bladder:  Normal for degree of bladder distention.  Other:  Minimal right upper quadrant ascites.  IMPRESSION: 1. Echogenic parenchyma involving both kidneys consistent with medical renal disease. 2. No evidence of hydronephrosis involving either kidney. 3. Minimal right upper quadrant ascites.   Electronically Signed   By: Evangeline Dakin M.D.   On: 06/02/2015 12:21   Dg Chest Portable 1 View  06/02/2015   CLINICAL DATA:  Syncope.  EXAM: PORTABLE CHEST - 1 VIEW  COMPARISON:  02/01/2015  FINDINGS: Interval enlargement of the cardiac silhouette, now at the upper limits normal in size. Mild vascular congestion. Ill-defined right basilar opacity. Mild hyperinflation and blunting of costophrenic angles. No pneumothorax. No acute osseous abnormalities.  IMPRESSION: 1. Interval enlargement of the cardiac silhouette, now at the upper limits of normal. Mild vascular congestion. 2. Ill-defined right basilar opacity, atelectasis versus pneumonia.    Electronically Signed   By: Jeb Levering M.D.   On: 06/02/2015 06:32     Medications:   . dextrose 5 % and 0.45% NaCl 75 mL/hr at 06/02/15 1227  . insulin (NOVOLIN-R) infusion 0.9 mL/hr at 06/03/15 2706   . albuterol  2.5 mg Nebulization Q6H  . antiseptic oral rinse  7 mL Mouth Rinse BID  . budesonide  0.25 mg Nebulization 4 times per day  . Chlorhexidine Gluconate Cloth  6 each Topical Q0600  . heparin  5,000 Units Subcutaneous 3 times per day  . levETIRAcetam  500 mg Intravenous Q12H  . mupirocin ointment  1 application Nasal BID  . pantoprazole (PROTONIX) IV  40 mg Intravenous Q24H  . piperacillin-tazobactam (ZOSYN)  IV  3.375 g Intravenous 3 times per day  . sodium chloride  3 mL Intravenous Q12H  . vancomycin  500 mg Intravenous Q24H  acetaminophen **OR** acetaminophen, albuterol, ALPRAZolam, docusate sodium, senna  Assessment/ Plan:  29 y.o. male with T1 DM, CKD, nephrotic proteinuria, Neuropathy, polysubstance abuse admitted for confusion and difficulty breathing  1. ARF, CKD unspecified - likely combination of acute illness leading to ATN and volume depletion - S Cr has improved - would continue iv fluids until PO intake is adequate - d/c iv fluids later today  2. DM type 1 with CKD, unspec and proteinuria - p/c ratio 2.4 gm - avoid ACE-i for now  Will sign off. Please call if there any further questions   LOS: 1 SINGH,HARMEET 9/17/20169:18 AM

## 2015-06-03 NOTE — Progress Notes (Signed)
[facility] [place] NAME: Maurice Davidson    MR#:  [id number]  DATE OF BIRTH:  07/07/1986  SUBJECTIVE:  CHIEF COMPLAINT:   Chief Complaint  Patient presents with  . Chest Pain  . Abdominal Pain    Feels much better today, able to toelrate his diet.  REVIEW OF SYSTEMS:  CONSTITUTIONAL: No fever, fatigue or weakness.  EYES: No blurred or double vision.  EARS, NOSE, AND THROAT: No tinnitus or ear pain.  RESPIRATORY: No cough, shortness of breath, wheezing or hemoptysis.  CARDIOVASCULAR: No chest pain, orthopnea, edema.  GASTROINTESTINAL: No nausea, vomiting, diarrhea or abdominal pain.  GENITOURINARY: No dysuria, hematuria.  ENDOCRINE: No polyuria, nocturia,  HEMATOLOGY: No anemia, easy bruising or bleeding SKIN: No rash or lesion. MUSCULOSKELETAL: No joint pain or arthritis.   NEUROLOGIC: No tingling, numbness, weakness.  PSYCHIATRY: No anxiety or depression.   ROS  DRUG ALLERGIES:   Allergies  Allergen Reactions  . Abilify [Aripiprazole] Rash    VITALS:  Blood pressure 146/101, pulse 101, temperature 98.3 F (36.8 C), temperature source Axillary, resp. rate 25, height 5\' 6"  (1.676 m), weight 49 kg (108 lb 0.4 oz), SpO2 98 %.  PHYSICAL EXAMINATION:  GENERAL:  29 y.o.-year-old patient lying in the bed with no acute distress.  EYES: Pupils equal, round, reactive to light and accommodation. No scleral icterus. Extraocular muscles intact.  HEENT: Head atraumatic, normocephalic. Oropharynx and nasopharynx clear.  NECK:  Supple, no jugular venous distention. No thyroid enlargement, no tenderness.  LUNGS: Normal breath sounds bilaterally, no wheezing, some crepitation. No use of accessory muscles of respiration. On 3 ltr oxygen now. CARDIOVASCULAR: S1, S2 normal. No murmurs, rubs, or gallops.  ABDOMEN: Soft, nontender, nondistended. Bowel sounds present. No organomegaly or mass.  EXTREMITIES: No pedal edema, cyanosis, or  clubbing.  NEUROLOGIC: Cranial nerves II through XII are intact. Muscle strength 5/5 in all extremities. Sensation intact. Gait not checked.  PSYCHIATRIC: The patient is alert and oriented x 3.  SKIN: No obvious rash, lesion, or ulcer.   Physical Exam LABORATORY PANEL:   CBC  Recent Labs Lab 06/03/15 0434  WBC 14.4*  HGB 10.0*  HCT 30.9*  PLT 378   ------------------------------------------------------------------------------------------------------------------  Chemistries   Recent Labs Lab 06/02/15 0556  06/03/15 0434  NA 136  < > 140  K 4.3  < > 3.3*  CL 90*  < > 105  CO2 30  < > 29  GLUCOSE 238*  < > 193*  BUN 64*  < > 24*  CREATININE 3.57*  < > 0.99  CALCIUM 8.8*  < > 7.8*  AST 25  --   --   ALT 12*  --   --   ALKPHOS 86  --   --   BILITOT 0.6  --   --   < > = values in this interval not displayed. ------------------------------------------------------------------------------------------------------------------  Cardiac Enzymes No results for input(s): TROPONINI in the last 168 hours. ------------------------------------------------------------------------------------------------------------------  RADIOLOGY:  Ct Head Wo Contrast  06/02/2015   CLINICAL DATA:  Syncope.  EXAM: CT HEAD WITHOUT CONTRAST  TECHNIQUE: Contiguous axial images were obtained from the base of the skull through the vertex without intravenous contrast.  COMPARISON:  September 29, 2013  FINDINGS: The ventricles are normal in size and configuration. There is no intracranial mass, hemorrhage, extra-axial fluid collection, or midline shift. Gray-white compartments are normal. No acute infarct evident. Bony calvarium appears intact. Visualized mastoid air cells are clear. There  is opacification in the visualized superior left maxillary antrum. There is mucosal thickening in several inferior ethmoid air cells as well.  IMPRESSION: Areas of paranasal sinus disease. No intracranial mass, hemorrhage, or  extra-axial fluid collection. Gray-white compartments appear normal. No acute infarct evident.   Electronically Signed   By: Lowella Grip III M.D.   On: 06/02/2015 07:40   US Renal  06/02/2015   CLINICAL DATA:  29 year old with sepsis and acute renal failure.  EXAM: RENAL / URINARY TRACT ULTRASOUND COMPLETE  COMPARISON:  CT abdomen and pelvis 11/19/2013, 10/28/2011. No prior ultrasound.  FINDINGS: Right Kidney:  Length: Approximately 11.0 cm. Echogenic renal parenchyma. No hydronephrosis. Well-preserved cortex. No focal parenchymal abnormality.  Left Kidney:  Length: Approximately 12.0 cm. Mildly echogenic parenchyma. No hydronephrosis. Well-preserved cortex. No focal parenchymal abnormality. Very small lower pole calculus identified on CT in March, 2015 is no longer visible.  Bladder:  Normal for degree of bladder distention.  Other:  Minimal right upper quadrant ascites.  IMPRESSION: 1. Echogenic parenchyma involving both kidneys consistent with medical renal disease. 2. No evidence of hydronephrosis involving either kidney. 3. Minimal right upper quadrant ascites.   Electronically Signed   By: Evangeline Dakin M.D.   On: 06/02/2015 12:21   Dg Chest Port 1 View  06/03/2015   CLINICAL DATA:  Respiratory failure  EXAM: PORTABLE CHEST - 1 VIEW  COMPARISON:  the previous day's study  FINDINGS: Patchy airspace opacities in the left mid and lower lung, new since previous exam. Scattered mild opacities at the right lung base are stable. Heart size normal. No pneumothorax. No effusion. Visualized skeletal structures are unremarkable.  IMPRESSION: 1. New LEFT mid and lower lung airspace opacities suggesting pneumonia or atypical edema.   Electronically Signed   By: Lucrezia Europe M.D.   On: 06/03/2015 09:25   Dg Chest Portable 1 View  06/02/2015   CLINICAL DATA:  Syncope.  EXAM: PORTABLE CHEST - 1 VIEW  COMPARISON:  02/01/2015  FINDINGS: Interval enlargement of the cardiac silhouette, now at the upper limits  normal in size. Mild vascular congestion. Ill-defined right basilar opacity. Mild hyperinflation and blunting of costophrenic angles. No pneumothorax. No acute osseous abnormalities.  IMPRESSION: 1. Interval enlargement of the cardiac silhouette, now at the upper limits of normal. Mild vascular congestion. 2. Ill-defined right basilar opacity, atelectasis versus pneumonia.   Electronically Signed   By: Jeb Levering M.D.   On: 06/02/2015 06:32    ASSESSMENT AND PLAN:   29 year old male with type 1 diabetes, CK D, neuropathy, polysubstance abuse admitted for sepsis  #1 acute hypoxic respiratory failure-likely secondary to pneumonia -Aspiration cannot be ruled out -required nonrebreather mask,in ICU -Pulmonary consult appreciated -Chest x-ray with right lower lobe pneumonia, but coarse rhonchi all over the lungs. -Follow up blood cultures. Empirically on vancomycin and Zosyn -Echo reviewed ( EF 55%) for cardiomegaly noted on chest x-ray - respi status improved today- transfer to floor.  #2 sepsis-secondary to pneumonia -Blood pressure is stable at this time. Follow-up blood cultures -Continue broad-spectrum antibiotics, may taper tomorrow.  #3 acute renal failure-according to family, patient has known CK D.  -Last labs from [facility] indicate creatinine of 2 in May 2016. However last creatinine in our system is normal in May 2016 - on admission creatinine > 3.5, retaining urine-420 mL on bladder scan - a Foley catheter for strict I's and O's monitoring -Renal ultrasound and nephrology consult appreciated - Gentle hydration, renal function improved significantly- normal today.  #4  metabolic encephalopathy-likely from sepsis and also renal failure -Tylenol and salicylate levels checked- below toxic range. -Follow up ABG. CT head without any acute findings. -Myoclonic jerks observed, could be from his gabapentin not being cleared from his system due to renal failure. -We'll continue his  Keppra, in IV form at this time. No obvious seizure activity noted - completely alert and oriented now.  #5 polysubstance abuse-tox screen positive for benzos which patient is on at home. -Also positive for marijuana and cocaine. Family denies any use of IV drugs. No IV track marks seen  #6 diabetes mellitus type 1 -  With uncontrolled Blood sugar- maintained on insulin drip.  - now Blood sugar under control, renal function improved and pt started eating, so resume his home lantus dose.  #7 hypertension-held enalapril due to his renal failure and also sepsis with possible hypotension  #8 depression and anxiety-held all his medications as currently very confused -Per PCP notes from 2 weeks ago, patient trying to use more xanax to deal with his stress.  #9 DVT prophylaxis-on subcutaneous heparin  Will transfer to floor today.  All the records are reviewed and case discussed with Care Management/Social Workerr. Management plans discussed with the patient and he is in agreement.  CODE STATUS: full  TOTAL TIME TAKING CARE OF THIS PATIENT: 35 minutes.   POSSIBLE D/C IN 1-2 DAYS, DEPENDING ON CLINICAL CONDITION.   Vaughan Basta M.D on 06/03/2015   Between 7am to 6pm - Pager - [phone]  After 6pm go to [email or web address] - password [email or web address]  [facility]  Office  [phone]  CC: Primary care physician; No primary care provider on file.

## 2015-06-03 NOTE — Consult Note (Signed)
PULMONARY / CRITICAL CARE MEDICINE   Name: Maurice Davidson MRN: [id number] DOB: 1986/02/19    ADMISSION DATE:  06/02/2015 CONSULTATION DATE:  9/16   INITIAL PRESENTATION:  29 M type I diabetic smoker in chronically poor health who presented with 2-3 days of general malaise and chest congestion and then was brought to ED after famiy called EMS when they heard him fall and found him with depressed LOC. In ambulance and ED, he was very hypoxic requiring high flow O2  MAJOR EVENTS/TEST RESULTS: 9/16 CT head: NAD 9/16 Renal US: no hydronephrosis 9/16 Echocardiogram: Ef 27%, grade 1 diastolic dysfunction  INDWELLING DEVICES::   MICRO DATA:  MRSA PCR 9/16 >> POS Blood 9/16 >>  Resp 9/16 >>  Urine legionella Ag 9/16 >>  Urine strep Ag 9/16 >>   ANTIMICROBIALS:  Vanc 9/16 >>  Pip-azo 9/16 >>     HISTORY OF PRESENT ILLNESS:  Patient alert and awake, following commands, transitioned to [place], patient feeling much beter  PAST MEDICAL HISTORY :   has a past medical history of Diabetes mellitus without complication; Seizures; Renal disorder; Anemia; MRSA (methicillin resistant staph aureus) culture positive; Sepsis; Aspiration pneumonia; H/O: GI bleed; Gastroparesis; Encephalopathy; Chronic pain; Drug-seeking behavior; and Opioid dependence.  has past surgical history that includes orthopedic surgery. Prior to Admission medications   Medication Sig Start Date End Date Taking? Authorizing Hiroki Wint  ALPRAZolam Duanne Moron) 1 MG tablet Take 1 mg by mouth 3 (three) times daily as needed for anxiety.   Yes Historical Lawana Hartzell, MD  aspirin EC 81 MG tablet Take 81 mg by mouth daily.   Yes Historical Mckensie Scotti, MD  buprenorphine-naloxone (SUBOXONE) 8-2 MG SUBL SL tablet Place 1 tablet under the tongue daily.   Yes Historical Marcey Persad, MD  busPIRone (BUSPAR) 5 MG tablet Take 15 mg by mouth 2 (two) times daily.   Yes Historical Moranda Billiot, MD  celecoxib (CELEBREX) 100 MG capsule Take 100 mg by mouth 2 (two)  times daily.   Yes Historical Marcelyn Ruppe, MD  diclofenac sodium (VOLTAREN) 1 % GEL Apply 2 g topically 4 (four) times daily.   Yes Historical Michayla Mcneil, MD  gabapentin (NEURONTIN) 300 MG capsule Take 300 mg by mouth 4 (four) times daily.   Yes Historical Ande Therrell, MD  insulin aspart (NOVOLOG) 100 UNIT/ML injection Inject 5 Units into the skin 3 (three) times daily with meals. Increase one unit per evert 12 carbs consumed  Hold if CBG <150   Yes Historical Olawale Marney, MD  insulin glargine (LANTUS) 100 UNIT/ML injection Inject 11 Units into the skin daily.   Yes Historical Jessy Cybulski, MD  metoCLOPramide (REGLAN) 10 MG tablet Take 10 mg by mouth 3 (three) times daily before meals.   Yes Historical Sion Thane, MD  Multiple Vitamins-Minerals (MULTIVITAMIN WITH MINERALS) tablet Take 1 tablet by mouth daily.   Yes Historical Dawnetta Copenhaver, MD  pantoprazole (PROTONIX) 20 MG tablet Take 20 mg by mouth 2 (two) times daily.   Yes Historical Mariusz Jubb, MD  sertraline (ZOLOFT) 50 MG tablet Take 150 mg by mouth daily.   Yes Historical Jordynn Perrier, MD  traZODone (DESYREL) 50 MG tablet Take 150 mg by mouth at bedtime.   Yes Historical Ezrael Sam, MD   Allergies  Allergen Reactions  . Abilify [Aripiprazole] Rash    FAMILY HISTORY:  has no family status information on file.  SOCIAL HISTORY:  reports that he has been smoking.  He does not have any smokeless tobacco history on file. He reports that he uses illicit drugs. He reports that  he does not drink alcohol.   3 PPD smoker  REVIEW OF SYSTEMS: Review of Systems  Constitutional: Positive for malaise/fatigue. Negative for fever, chills and weight loss.  HENT: Negative for hearing loss.   Respiratory: Positive for cough and sputum production. Negative for shortness of breath.   Gastrointestinal: Negative for heartburn and nausea.  Musculoskeletal: Negative for myalgias and neck pain.  Skin: Negative for rash.  Neurological: Negative for tingling, tremors and headaches.   Psychiatric/Behavioral: The patient is nervous/anxious.    Physical Exam  Constitutional: He is oriented to person, place, and time. No distress.  HENT:  Head: Normocephalic and atraumatic.  Eyes: Conjunctivae are normal. Pupils are equal, round, and reactive to light.  Neck: Normal range of motion. Neck supple.  Cardiovascular: Normal rate and regular rhythm.   No murmur heard. Pulmonary/Chest: Effort normal and breath sounds normal.  Abdominal: Soft. He exhibits no distension.  Musculoskeletal: Normal range of motion. He exhibits no edema.  Neurological: He is alert and oriented to person, place, and time.  Skin: Skin is warm and dry. He is not diaphoretic.  Psychiatric: He has a normal mood and affect.     SUBJECTIVE:   VITAL SIGNS: Temp:  [98.1 F (36.7 C)-101.4 F (38.6 C)] 98.3 F (36.8 C) (09/17 0400) Pulse Rate:  [89-116] 99 (09/17 0600) Resp:  [14-27] 27 (09/17 0600) BP: (104-154)/(61-102) 147/101 mmHg (09/17 0600) SpO2:  [92 %-100 %] 100 % (09/17 0600) FiO2 (%):  [100 %] 100 % (09/17 0150) Weight:  [108 lb 0.4 oz (49 kg)] 108 lb 0.4 oz (49 kg) (09/16 1017) HEMODYNAMICS:   VENTILATOR SETTINGS: Vent Mode:  [-]  FiO2 (%):  [100 %] 100 % INTAKE / OUTPUT:  Intake/Output Summary (Last 24 hours) at 06/03/15 0807 Last data filed at 06/03/15 0630  Gross per 24 hour  Intake 2961.2 ml  Output   2750 ml  Net  211.2 ml    PHYSICAL EXAMINATION: General: Very thin, mildly labored resp on NRB mask Neuro: RASS -2, + F/C, CNs intact, MAEs, DTRs symmetric HEENT: NCAT Cardiovascular: Reg, no M Lungs: mildly labored, no wheezes, R basilar crackles, bronchial BS in LLL Abdomen: soft, NT, +BS, no HSM Ext: no edema, warm  LABS:  CBC  Recent Labs Lab 06/02/15 0556 06/03/15 0434  WBC 14.4* 14.4*  HGB 12.0* 10.0*  HCT 37.4* 30.9*  PLT 440 378   Coag's No results for input(s): APTT, INR in the last 168 hours. BMET  Recent Labs Lab 06/02/15 1818  06/03/15 0010 06/03/15 0434  NA 137 138 140  K 4.4 3.5 3.3*  CL 103 104 105  CO2 25 26 29   BUN 37* 29* 24*  CREATININE 1.56* 1.23 0.99  GLUCOSE 178* 216* 193*   Electrolytes  Recent Labs Lab 06/02/15 1818 06/03/15 0010 06/03/15 0434  CALCIUM 7.7* 7.9* 7.8*   Sepsis Markers  Recent Labs Lab 06/02/15 0752 06/02/15 1040 06/03/15 0434  LATICACIDVEN 1.7  --   --   PROCALCITON  --  2.94 3.73   ABG  Recent Labs Lab 06/02/15 0910  PHART 7.41  PCO2ART 42  PO2ART 74*   Liver Enzymes  Recent Labs Lab 06/02/15 0556  AST 25  ALT 12*  ALKPHOS 86  BILITOT 0.6  ALBUMIN 3.7   Cardiac Enzymes No results for input(s): TROPONINI, PROBNP in the last 168 hours. Glucose  Recent Labs Lab 06/03/15 0201 06/03/15 0307 06/03/15 0409 06/03/15 0426 06/03/15 0530 06/03/15 0629  GLUCAP 210* 115* 66 223*  176* 145*    CXR: vague infiltrate in RLL, minimal blunting of L CP angle    ASSESSMENT / PLAN:  29 yo white male admitted to ICU for acute severe sepsis with hypoxic resp failure from pneumonia with DKA    Patient clinically improving   PULMONARY A:resolving P:   Ok to transfer to gen med floor Supplemental 02 to maintain SpO2 92-97% Empiric nebulized steroids and albuterol  CARDIOVASCULAR A:  Sinus tachycardia due to fever and acute illness  RENAL A:   AKI - 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 and superimposed ATN, hypovolemia P:   Monitor BMET intermittently Monitor I/Os Correct electrolytes as indicated Renal service following  GASTROINTESTINAL A:   Diabetic gastroparesis Chronic PPI, regaln Advance diet as tolerated  HEMATOLOGIC A:  No issues P:  DVT px: SQ heparin Monitor CBC intermittently Transfuse as needed per usual guidelines  INFECTIOUS A:  Severe sepsis Pneumonia - Exam suggests LLL consolidation  Continue abx as prescribed -obtain sputum cultures  ENDOCRINE A:   DM type 1, poorly controlled P:   Glucommander protocol  per primary team  NEUROLOGIC No acute issues, encephlopathy resolving  I have personally obtained a history, examined the patient, evaluated Pertinent laboratory and RadioGraphic/imaging results, and  formulated the assessment and plan   The Patient requires high complexity decision making for assessment and support, frequent evaluation and titration of therapies, application of advanced monitoring technologies and extensive interpretation of multiple databases.    Corrin Parker, M.D.  [facility]  Medical Director [place] Director [facility] Cardio-Pulmonary Department

## 2015-06-03 NOTE — Progress Notes (Signed)
[facility] ADULT ICU REPLACEMENT PROTOCOL FOR AM LAB REPLACEMENT ONLY  The patient does apply for the [facility] Adult ICU Electrolyte Replacment Protocol based on the criteria listed below:   1. Is GFR >/= 40 ml/min? Yes.    Patient's GFR today is >60 2. Is urine output >/= 0.5 ml/kg/hr for the last 6 hours? Yes.   Patient's UOP is 5.0 ml/kg/hr 3. Is BUN < 60 mg/dL? Yes.    Patient's BUN today is 24 4. Abnormal electrolyte(s): K 3.3 5. Ordered repletion with: per protocol 6. If a panic level lab has been reported, has the CCM MD in charge been notified? No..   Physician:    Ronda Fairly A 06/03/2015 6:31 AM

## 2015-06-03 NOTE — Consult Note (Signed)
  Pt seen on 9/17/201 in ICU -4 at [place] is a 29 yr old white male not employed and last worked many yrs ago. Pt lives with his parents. Pt comes to [facility] with Pnuemonia of both lungs According to information obtd by Staff pt 's UDS was positive of THC, Benzos and Cocaine. [place] think that pt has been using lots of Cocaine. Cc " I was sick for few days and my 02 was low and my parents got concerned and brought him here for help." Past psych history. No previous H/O inpt to psychiatry. No H/O suicide attempts. Not being followed by any psychiatrist. Pt reports that he is being followed by PCP Dr.  Julieanne Manson in [place] for Panic anxiety disorder and is on Xanax one mg po tid. Last apt was a month ago. Next apt is in ist week of October. Alcohol and drugs. Denies drinking alcohol. Denies street or prescription drug abuse. Pt stated that he smokes THC occasionally and smoked Cocaine on 2 occasions so far which was long time ago. M.S. Alert and ox3. Appears calm, pleasent and co-operative. No agitation. Denies feeling depressed. Denies feeling h/h or w/u. No psychosis. Memory intact. Denies s/h ideas or plans. I/J guarded. Impulse control fair. Imp H/O Panic anxiety disorder. Poly Substance abuse - THC, Benzos and Cocaine which pt denies. REc Discharge pt after he is medically stable and cleared and has follow up at [facility] in ist week of October which he plans to keep. Pt  Is to be referred for Substance abuse Counseling if he is interested in taking help.

## 2015-06-03 NOTE — Progress Notes (Signed)
Inpatient Diabetes Program Recommendations  AACE/ADA: New Consensus Statement on Inpatient Glycemic Control (2015)  Target Ranges:  Prepandial:   less than 140 mg/dL      Peak postprandial:   less than 180 mg/dL (1-2 hours)      Critically ill patients:  140 - 180 mg/dL   Review of Glycemic Control  Meets criteria for transitioning to SQ insulin. AG - 6 CO2 - 29  Inpatient Diabetes Program Recommendations:    Lantus 10 units Q24H. Give first dose 1-2 hours prior to discontinuation of insulin drip. Novolog sensitive Q4H x 12H, then tidwc and hs. Give first dose at same time that insulin drip is d/ced Novolog 3 units tidwc for meal coverage insulin if pt eats > 50% meal.  Will follow glucose trends. Thank you. Lorenda Peck, RD, LDN, CDE Inpatient Diabetes Coordinator [phone]

## 2015-06-04 LAB — C DIFFICILE QUICK SCREEN W PCR REFLEX
C DIFFICILE (CDIFF) TOXIN: NEGATIVE
C DIFFICLE (CDIFF) ANTIGEN: NEGATIVE
C Diff interpretation: NEGATIVE

## 2015-06-04 LAB — BASIC METABOLIC PANEL
ANION GAP: 10 (ref 5–15)
BUN: 10 mg/dL (ref 6–20)
CALCIUM: 9.2 mg/dL (ref 8.9–10.3)
CO2: 28 mmol/L (ref 22–32)
Chloride: 100 mmol/L — ABNORMAL LOW (ref 101–111)
Creatinine, Ser: 0.88 mg/dL (ref 0.61–1.24)
GLUCOSE: 150 mg/dL — AB (ref 65–99)
Potassium: 3 mmol/L — ABNORMAL LOW (ref 3.5–5.1)
Sodium: 138 mmol/L (ref 135–145)

## 2015-06-04 LAB — GLUCOSE, CAPILLARY
GLUCOSE-CAPILLARY: 411 mg/dL — AB (ref 65–99)
GLUCOSE-CAPILLARY: 502 mg/dL — AB (ref 65–99)
Glucose-Capillary: 320 mg/dL — ABNORMAL HIGH (ref 65–99)
Glucose-Capillary: 406 mg/dL — ABNORMAL HIGH (ref 65–99)
Glucose-Capillary: 414 mg/dL — ABNORMAL HIGH (ref 65–99)

## 2015-06-04 LAB — PROCALCITONIN: PROCALCITONIN: 2.23 ng/mL

## 2015-06-04 MED ORDER — DOXYCYCLINE HYCLATE 50 MG PO CAPS: 50.0000 mg | ORAL_CAPSULE | Freq: Two times a day (BID) | ORAL | Status: AC

## 2015-06-04 MED ORDER — ENALAPRIL MALEATE 10 MG PO TABS: 10.0000 mg | ORAL_TABLET | Freq: Every day | ORAL | Status: DC

## 2015-06-04 MED ORDER — INSULIN ASPART 100 UNIT/ML ~~LOC~~ SOLN
15.0000 [IU] | Freq: Once | SUBCUTANEOUS | Status: AC
  Administered 2015-06-04: 15 [IU] via SUBCUTANEOUS
  Filled 2015-06-04: qty 15

## 2015-06-04 MED ORDER — AMLODIPINE BESYLATE 5 MG PO TABS: 5.0000 mg | ORAL_TABLET | Freq: Every day | ORAL | Status: DC

## 2015-06-04 MED ORDER — AMLODIPINE BESYLATE 5 MG PO TABS
5.0000 mg | ORAL_TABLET | Freq: Every day | ORAL | Status: DC
  Administered 2015-06-04: 5 mg via ORAL
  Filled 2015-06-04: qty 1

## 2015-06-04 MED ORDER — PANTOPRAZOLE SODIUM 40 MG PO TBEC: 40.0000 mg | DELAYED_RELEASE_TABLET | Freq: Every day | ORAL | Status: DC

## 2015-06-04 NOTE — Discharge Summary (Signed)
[facility] [place] NAME: Maurice Davidson    MR#:  [id number]  DATE OF BIRTH:  Dec 17, 1985  DATE OF ADMISSION:  06/02/2015 ADMITTING PHYSICIAN: Gladstone Lighter, MD  DATE OF DISCHARGE: 06/04/2015  PRIMARY CARE PHYSICIAN: No primary care Marin Wisner on file.    ADMISSION DIAGNOSIS:  ARF (acute renal failure) [N17.9] Community acquired pneumonia [J18.9] Sepsis, due to unspecified organism [A41.9]  DISCHARGE DIAGNOSIS:  Active Problems:   Sepsis   Protein-calorie malnutrition, severe   SECONDARY DIAGNOSIS:   Past Medical History  Diagnosis Date  . Diabetes mellitus without complication   . Seizures   . Renal disorder   . Anemia   . MRSA (methicillin resistant staph aureus) culture positive   . Sepsis   . Aspiration pneumonia   . H/O: GI bleed   . Gastroparesis   . Encephalopathy   . Chronic pain     take Suboxone  . Drug-seeking behavior   . Opioid dependence     HOSPITAL COURSE:   29 year old male with type 1 diabetes, CK D, neuropathy, polysubstance abuse admitted for sepsis  #1 acute hypoxic respiratory failure-likely secondary to pneumonia -Aspiration cannot be ruled out, Pt have MRSA screen positive. -required nonrebreather mask,in ICU -Pulmonary consult appreciated -Chest x-ray with right lower lobe pneumonia, but coarse rhonchi all over the lungs. -Follow up blood cultures- negative. Empirically on vancomycin and Zosyn -Echo reviewed ( EF 55%) for cardiomegaly noted on chest x-ray - respi status improved on room air- walking in room.  #2 sepsis-secondary to pneumonia -Blood pressure is stable at this time. Follow-up blood cultures negative. -Continue broad-spectrum antibiotics, will give oral doxy on d/c.  #3 acute renal failure-according to family, patient has known CK D.  -Last labs from [facility] indicate creatinine of 2 in May 2016. However last creatinine in our system is normal in May 2016 - on  admission creatinine > 3.5, retaining urine-420 mL on bladder scan - a Foley catheter for strict I's and O's monitoring -Renal ultrasound and nephrology consult appreciated - Gentle hydration, renal function improved significantly- normal today.  #4 metabolic encephalopathy-likely from sepsis and also renal failure -Tylenol and salicylate levels checked- below toxic range. -Follow up ABG. CT head without any acute findings. -Myoclonic jerks observed, could be from his gabapentin not being cleared from his system due to renal failure. - continue his Keppra, in IV form at this time. No obvious seizure activity noted - completely alert and oriented now.  #5 polysubstance abuse-tox screen positive for benzos which patient is on at home. -Also positive for marijuana and cocaine. Family denies any use of IV drugs. No IV track marks seen  #6 diabetes mellitus type 1 - With uncontrolled Blood sugar- maintained on insulin drip. - now Blood sugar under control, renal function improved and pt started eating, so resume his home lantus dose. - He is eating out side food and drinks provided by his mother. - His blood sugar level running around 400- I asked him, he said" as I was NPO since admission- I am very hungry and eating a lot since last evening, but once home- I will be able to control my blood sugar level fine. As He is doing since long time"  #7 hypertension-held enalapril due to his renal failure and also sepsis with possible hypotension   resumed now.  #8 depression and anxiety-held all his medications as currently very confused -Per PCP notes from 2 weeks ago, patient trying to  use more xanax to deal with his stress.  #9 DVT prophylaxis-on subcutaneous heparin  DISCHARGE CONDITIONS:   Stable, walking in room.  CONSULTS OBTAINED:  Treatment Team:  Dewain Penning, MD  DRUG ALLERGIES:   Allergies  Allergen Reactions  . Abilify [Aripiprazole] Rash    DISCHARGE MEDICATIONS:    Current Discharge Medication List    START taking these medications   Details  amLODipine (NORVASC) 5 MG tablet Take 1 tablet (5 mg total) by mouth daily. Qty: 30 tablet, Refills: 0    doxycycline (VIBRAMYCIN) 50 MG capsule Take 1 capsule (50 mg total) by mouth 2 (two) times daily. Qty: 10 capsule, Refills: 0    enalapril (VASOTEC) 10 MG tablet Take 1 tablet (10 mg total) by mouth daily. Qty: 30 tablet, Refills: 0      CONTINUE these medications which have NOT CHANGED   Details  ALPRAZolam (XANAX) 1 MG tablet Take 1 mg by mouth 3 (three) times daily as needed for anxiety.    aspirin EC 81 MG tablet Take 81 mg by mouth daily.    buprenorphine-naloxone (SUBOXONE) 8-2 MG SUBL SL tablet Place 1 tablet under the tongue daily.    busPIRone (BUSPAR) 5 MG tablet Take 15 mg by mouth 2 (two) times daily.    celecoxib (CELEBREX) 100 MG capsule Take 100 mg by mouth 2 (two) times daily.    diclofenac sodium (VOLTAREN) 1 % GEL Apply 2 g topically 4 (four) times daily.    gabapentin (NEURONTIN) 300 MG capsule Take 300 mg by mouth 4 (four) times daily.    insulin aspart (NOVOLOG) 100 UNIT/ML injection Inject 5 Units into the skin 3 (three) times daily with meals. Increase one unit per evert 12 carbs consumed  Hold if CBG <150    insulin glargine (LANTUS) 100 UNIT/ML injection Inject 11 Units into the skin daily.    metoCLOPramide (REGLAN) 10 MG tablet Take 10 mg by mouth 3 (three) times daily before meals.    Multiple Vitamins-Minerals (MULTIVITAMIN WITH MINERALS) tablet Take 1 tablet by mouth daily.    pantoprazole (PROTONIX) 20 MG tablet Take 20 mg by mouth 2 (two) times daily.    sertraline (ZOLOFT) 50 MG tablet Take 150 mg by mouth daily.    traZODone (DESYREL) 50 MG tablet Take 150 mg by mouth at bedtime.         DISCHARGE INSTRUCTIONS:    Follow with PMD in 1-2 weeks.  If you experience worsening of your admission symptoms, develop shortness of breath, life  threatening emergency, suicidal or homicidal thoughts you must seek medical attention immediately by calling 911 or calling your MD immediately  if symptoms less severe.  You Must read complete instructions/literature along with all the possible adverse reactions/side effects for all the Medicines you take and that have been prescribed to you. Take any new Medicines after you have completely understood and accept all the possible adverse reactions/side effects.   Please note  You were cared for by a hospitalist during your hospital stay. If you have any questions about your discharge medications or the care you received while you were in the hospital after you are discharged, you can call the unit and asked to speak with the hospitalist on call if the hospitalist that took care of you is not available. Once you are discharged, your primary care physician will handle any further medical issues. Please note that NO REFILLS for any discharge medications will be authorized once you are discharged, as it  is imperative that you return to your primary care physician (or establish a relationship with a primary care physician if you do not have one) for your aftercare needs so that they can reassess your need for medications and monitor your lab values.    Today   CHIEF COMPLAINT:   Chief Complaint  Patient presents with  . Chest Pain  . Abdominal Pain    HISTORY OF PRESENT ILLNESS:  Srihari Shellhammer  is a 29 y.o. male with a known history of type 1 diabetes mellitus, CKD with unknown baseline creatinine, gastroparesis, depression and anxiety, polysubstance abuse presents to the hospital from home secondary to confusion and fevers and difficulty breathing. Patient is very confused at this time, unable to provide any history. Most of the history is obtained from his parents at bedside. According to family, patient has been having symptoms of cold congestion as everybody in the family had these symptoms for  the last 3-4 days. Yesterday patient did mention that he was not feeling good and was very fatigued and was mostly in bed. This morning around 5:00 they heard a thump and went in to check on him and found him on the floor. He was having shaky chills, confusion and breathing difficulty. So he was brought to the emergency room. He is noted to be in acute renal failure, sepsis from pneumonia. Hypoxic requiring nonrebreather mask at this time. Very confused. Blood pressure is stable, renal function with worsening creatinine. Chest x-ray showing right lower lobe pneumonia. Patient is critically ill and will be admitted to ICU   VITAL SIGNS:  Blood pressure 178/106, pulse 108, temperature 98.3 F (36.8 C), temperature source Oral, resp. rate 18, height 5\' 6"  (1.676 m), weight 50.077 kg (110 lb 6.4 oz), SpO2 91 %.  I/O:   Intake/Output Summary (Last 24 hours) at 06/04/15 1121 Last data filed at 06/04/15 1000  Gross per 24 hour  Intake    895 ml  Output    800 ml  Net     95 ml    PHYSICAL EXAMINATION:   GENERAL: 29 y.o.-year-old patient lying in the bed with no acute distress.  EYES: Pupils equal, round, reactive to light and accommodation. No scleral icterus. Extraocular muscles intact.  HEENT: Head atraumatic, normocephalic. Oropharynx and nasopharynx clear.  NECK: Supple, no jugular venous distention. No thyroid enlargement, no tenderness.  LUNGS: Normal breath sounds bilaterally, no wheezing, some crepitation. No use of accessory muscles of respiration. On 3 ltr oxygen now. CARDIOVASCULAR: S1, S2 normal. No murmurs, rubs, or gallops.  ABDOMEN: Soft, nontender, nondistended. Bowel sounds present. No organomegaly or mass.  EXTREMITIES: No pedal edema, cyanosis, or clubbing.  NEUROLOGIC: Cranial nerves II through XII are intact. Muscle strength 5/5 in all extremities. Sensation intact. Gait not checked.  PSYCHIATRIC: The patient is alert and oriented x 3.  SKIN: No obvious rash,  lesion, or ulcer.   DATA REVIEW:   CBC  Recent Labs Lab 06/03/15 0434  WBC 14.4*  HGB 10.0*  HCT 30.9*  PLT 378    Chemistries   Recent Labs Lab 06/02/15 0556  06/04/15 0452  NA 136  < > 138  K 4.3  < > 3.0*  CL 90*  < > 100*  CO2 30  < > 28  GLUCOSE 238*  < > 150*  BUN 64*  < > 10  CREATININE 3.57*  < > 0.88  CALCIUM 8.8*  < > 9.2  AST 25  --   --  ALT 12*  --   --   ALKPHOS 86  --   --   BILITOT 0.6  --   --   < > = values in this interval not displayed.  Cardiac Enzymes No results for input(s): TROPONINI in the last 168 hours.  Microbiology Results  Results for orders placed or performed during the hospital encounter of 06/02/15  Blood culture (routine x 2)     Status: None (Preliminary result)   Collection Time: 06/02/15  6:46 AM  Result Value Ref Range Status   Specimen Description BLOOD LEFT ARM  Final   Special Requests   Final    BOTTLES DRAWN AEROBIC AND ANAEROBIC 5MLANAEROBIC, 11MLAEROBIC   Culture NO GROWTH 2 DAYS  Final   Report Status PENDING  Incomplete  Blood culture (routine x 2)     Status: None (Preliminary result)   Collection Time: 06/02/15  6:47 AM  Result Value Ref Range Status   Specimen Description BLOOD RIGHT ANTECUBITAL  Final   Special Requests   Final    BOTTLES DRAWN AEROBIC AND ANAEROBIC 5MLAEROBIC,10MLANAEROBIC   Culture NO GROWTH 2 DAYS  Final   Report Status PENDING  Incomplete  MRSA PCR Screening     Status: Abnormal   Collection Time: 06/02/15 10:28 AM  Result Value Ref Range Status   MRSA by PCR POSITIVE (A) NEGATIVE Final    Comment:        The GeneXpert MRSA Assay (FDA approved for NASAL specimens only), is one component of a comprehensive MRSA colonization surveillance program. It is not intended to diagnose MRSA infection nor to guide or monitor treatment for MRSA infections. CRITICAL RESULT CALLED TO, READ BACK BY AND VERIFIED WITH: Venetia Night LINDSAY 06/02/15 1220 SJ[facility]   Culture, expectorated  sputum-assessment     Status: None   Collection Time: 06/03/15  8:20 AM  Result Value Ref Range Status   Specimen Description EXPECTORATED SPUTUM  Final   Special Requests NONE  Final   Sputum evaluation THIS SPECIMEN IS ACCEPTABLE FOR SPUTUM CULTURE  Final   Report Status 06/03/2015 FINAL  Final  Culture, respiratory (NON-Expectorated)     Status: None (Preliminary result)   Collection Time: 06/03/15  8:20 AM  Result Value Ref Range Status   Specimen Description EXPECTORATED SPUTUM  Final   Special Requests NONE Reflexed from [id number]  Final   Gram Stain PENDING  Incomplete   Culture TOO YOUNG TO READ  Final   Report Status PENDING  Incomplete  C difficile quick scan w PCR reflex     Status: None   Collection Time: 06/04/15  3:00 AM  Result Value Ref Range Status   C Diff antigen NEGATIVE NEGATIVE Final   C Diff toxin NEGATIVE NEGATIVE Final   C Diff interpretation Negative for C. difficile  Final    RADIOLOGY:  US Renal  06/02/2015   CLINICAL DATA:  29 year old with sepsis and acute renal failure.  EXAM: RENAL / URINARY TRACT ULTRASOUND COMPLETE  COMPARISON:  CT abdomen and pelvis 11/19/2013, 10/28/2011. No prior ultrasound.  FINDINGS: Right Kidney:  Length: Approximately 11.0 cm. Echogenic renal parenchyma. No hydronephrosis. Well-preserved cortex. No focal parenchymal abnormality.  Left Kidney:  Length: Approximately 12.0 cm. Mildly echogenic parenchyma. No hydronephrosis. Well-preserved cortex. No focal parenchymal abnormality. Very small lower pole calculus identified on CT in March, 2015 is no longer visible.  Bladder:  Normal for degree of bladder distention.  Other:  Minimal right upper quadrant ascites.  IMPRESSION: 1. Echogenic parenchyma  involving both kidneys consistent with medical renal disease. 2. No evidence of hydronephrosis involving either kidney. 3. Minimal right upper quadrant ascites.   Electronically Signed   By: Evangeline Dakin M.D.   On: 06/02/2015 12:21   Dg Chest  Port 1 View  06/03/2015   CLINICAL DATA:  Respiratory failure  EXAM: PORTABLE CHEST - 1 VIEW  COMPARISON:  the previous day's study  FINDINGS: Patchy airspace opacities in the left mid and lower lung, new since previous exam. Scattered mild opacities at the right lung base are stable. Heart size normal. No pneumothorax. No effusion. Visualized skeletal structures are unremarkable.  IMPRESSION: 1. New LEFT mid and lower lung airspace opacities suggesting pneumonia or atypical edema.   Electronically Signed   By: Lucrezia Europe M.D.   On: 06/03/2015 09:25      Management plans discussed with the patient, family and they are in agreement.  CODE STATUS:     Code Status Orders        Start     Ordered   06/02/15 1003  Full code   Continuous     06/02/15 1002    Advance Directive Documentation        Most Recent Value   Type of Advance Directive  Healthcare Power of Attorney   Pre-existing out of facility DNR order (yellow form or pink MOST form)     "MOST" Form in Place?        TOTAL TIME TAKING CARE OF THIS PATIENT: 35 minutes.    Vaughan Basta M.D on 06/04/2015 at 11:21 AM  Between 7am to 6pm - Pager - [phone]  After 6pm go to [email or web address] - password [email or web address] [facility]  [facility]  Office  [phone]  CC: Primary care physician; No primary care Huie Ghuman on file.

## 2015-06-04 NOTE — Progress Notes (Signed)
Encouraged pt to do deep breathing & cough Q1H.

## 2015-06-04 NOTE — Progress Notes (Signed)
Md called at 2240 06/03/2015 due to CBG of 374.  Dr. Jannifer Franklin placed orders for [facility] and HS coverage as well as increasing pt from a sensitive to moderate coverage scale.  15u given at 2309.  CBG rechecked at 0016 which was 320.  MD informed, a one time of 15u novolog and recheck in one hour was ordered.  15u of novolog given at 0044.  Pt transferred to 1C at 0135.  Pt A&O.  On 4L [place] O2 within normal limits.  Report given to Arbutus Ped, RN informed of CBG recheck at [place].

## 2015-06-04 NOTE — Plan of Care (Signed)
Problem: Discharge Progression Outcomes Goal: Other Discharge Outcomes/Goals Outcome: Progressing Plan of Care Progress to Goal:   Pt was transferred from CCU this am. Pt has numerous loose stool and was C.diff negative. Pt denies pain. Pt has an ulceration on top of right foot. No other signs of distress noted. Answered all questions asked by oncoming nurse.

## 2015-06-04 NOTE — Progress Notes (Signed)
  PHARMACIST - PHYSICIAN COMMUNICATION  CONCERNING: IV to Oral Route Change Policy  RECOMMENDATION: This patient is receiving PROTONIX by the intravenous route.  Based on criteria approved by the Pharmacy and Therapeutics Committee, the intravenous medication(s) is/are being converted to the equivalent oral dose form(s).   DESCRIPTION: These criteria include:  The patient is eating (either orally or via tube) and/or has been taking other orally administered medications for a least 24 hours  The patient has no evidence of active gastrointestinal bleeding or impaired GI absorption (gastrectomy, short bowel, patient on TNA or NPO).  If you have questions about this conversion, please contact the Pharmacy Department  []   [phone] )  [facility] [x]   [phone] )  [facility] []   [phone] )  [facility] []   [phone] )  [facility] []   [phone] )  [place] PharmD Clinical Pharmacist 06/04/2015 11:06 AM

## 2015-06-05 LAB — LEGIONELLA PNEUMOPHILA SEROGP 1 UR AG: L. PNEUMOPHILA SEROGP 1 UR AG: NEGATIVE

## 2015-06-05 LAB — CULTURE, RESPIRATORY W GRAM STAIN: Culture: NORMAL

## 2015-06-05 LAB — CULTURE, RESPIRATORY

## 2015-06-06 LAB — GLUCOSE, CAPILLARY: Glucose-Capillary: 195 mg/dL — ABNORMAL HIGH (ref 65–99)

## 2015-06-07 LAB — CULTURE, BLOOD (ROUTINE X 2)
Culture: NO GROWTH
Culture: NO GROWTH

## 2015-11-27 ENCOUNTER — Emergency Department: Payer: Medicaid Other

## 2015-11-27 ENCOUNTER — Encounter: Payer: Self-pay | Admitting: Emergency Medicine

## 2015-11-27 ENCOUNTER — Inpatient Hospital Stay
Admission: EM | Admit: 2015-11-27 | Discharge: 2015-11-30 | DRG: 377 | Discharge status: Against Medical Advice | Payer: Medicaid Other | Attending: Internal Medicine | Admitting: Internal Medicine

## 2015-11-27 DIAGNOSIS — I9589 Other hypotension: Secondary | ICD-10-CM | POA: Diagnosis present

## 2015-11-27 DIAGNOSIS — T39395A Adverse effect of other nonsteroidal anti-inflammatory drugs [NSAID], initial encounter: Secondary | ICD-10-CM | POA: Diagnosis present

## 2015-11-27 DIAGNOSIS — Z7982 Long term (current) use of aspirin: Secondary | ICD-10-CM

## 2015-11-27 DIAGNOSIS — R079 Chest pain, unspecified: Secondary | ICD-10-CM | POA: Diagnosis not present

## 2015-11-27 DIAGNOSIS — K920 Hematemesis: Secondary | ICD-10-CM | POA: Diagnosis present

## 2015-11-27 DIAGNOSIS — E109 Type 1 diabetes mellitus without complications: Secondary | ICD-10-CM | POA: Diagnosis present

## 2015-11-27 DIAGNOSIS — F419 Anxiety disorder, unspecified: Secondary | ICD-10-CM | POA: Diagnosis present

## 2015-11-27 DIAGNOSIS — I10 Essential (primary) hypertension: Secondary | ICD-10-CM | POA: Diagnosis present

## 2015-11-27 DIAGNOSIS — R000 Tachycardia, unspecified: Secondary | ICD-10-CM | POA: Diagnosis present

## 2015-11-27 DIAGNOSIS — K921 Melena: Secondary | ICD-10-CM | POA: Diagnosis present

## 2015-11-27 DIAGNOSIS — I959 Hypotension, unspecified: Secondary | ICD-10-CM | POA: Diagnosis present

## 2015-11-27 DIAGNOSIS — E872 Acidosis: Secondary | ICD-10-CM | POA: Diagnosis present

## 2015-11-27 DIAGNOSIS — D62 Acute posthemorrhagic anemia: Secondary | ICD-10-CM | POA: Diagnosis present

## 2015-11-27 DIAGNOSIS — Z8619 Personal history of other infectious and parasitic diseases: Secondary | ICD-10-CM | POA: Diagnosis present

## 2015-11-27 DIAGNOSIS — Z8614 Personal history of Methicillin resistant Staphylococcus aureus infection: Secondary | ICD-10-CM

## 2015-11-27 DIAGNOSIS — F17210 Nicotine dependence, cigarettes, uncomplicated: Secondary | ICD-10-CM | POA: Diagnosis present

## 2015-11-27 DIAGNOSIS — N170 Acute kidney failure with tubular necrosis: Secondary | ICD-10-CM | POA: Diagnosis present

## 2015-11-27 DIAGNOSIS — K922 Gastrointestinal hemorrhage, unspecified: Secondary | ICD-10-CM | POA: Diagnosis present

## 2015-11-27 DIAGNOSIS — F1120 Opioid dependence, uncomplicated: Secondary | ICD-10-CM | POA: Diagnosis present

## 2015-11-27 DIAGNOSIS — G894 Chronic pain syndrome: Secondary | ICD-10-CM | POA: Diagnosis present

## 2015-11-27 DIAGNOSIS — Z8249 Family history of ischemic heart disease and other diseases of the circulatory system: Secondary | ICD-10-CM | POA: Diagnosis not present

## 2015-11-27 DIAGNOSIS — Z888 Allergy status to other drugs, medicaments and biological substances status: Secondary | ICD-10-CM | POA: Diagnosis not present

## 2015-11-27 DIAGNOSIS — R569 Unspecified convulsions: Secondary | ICD-10-CM | POA: Diagnosis present

## 2015-11-27 DIAGNOSIS — K5900 Constipation, unspecified: Secondary | ICD-10-CM | POA: Diagnosis present

## 2015-11-27 DIAGNOSIS — E876 Hypokalemia: Secondary | ICD-10-CM | POA: Diagnosis present

## 2015-11-27 DIAGNOSIS — Z79899 Other long term (current) drug therapy: Secondary | ICD-10-CM | POA: Diagnosis not present

## 2015-11-27 DIAGNOSIS — Z794 Long term (current) use of insulin: Secondary | ICD-10-CM

## 2015-11-27 DIAGNOSIS — A419 Sepsis, unspecified organism: Secondary | ICD-10-CM

## 2015-11-27 HISTORY — DX: Essential (primary) hypertension: I10

## 2015-11-27 HISTORY — DX: Anxiety disorder, unspecified: F41.9

## 2015-11-27 LAB — CBC WITH DIFFERENTIAL/PLATELET
BASOS ABS: 0 10*3/uL (ref 0–0.1)
Basophils Relative: 0 %
EOS ABS: 0 10*3/uL (ref 0–0.7)
Eosinophils Relative: 0 %
HEMATOCRIT: 34.5 % — AB (ref 40.0–52.0)
Hemoglobin: 11.4 g/dL — ABNORMAL LOW (ref 13.0–18.0)
LYMPHS ABS: 4.2 10*3/uL — AB (ref 1.0–3.6)
LYMPHS PCT: 16 %
MCH: 27.1 pg (ref 26.0–34.0)
MCHC: 33.1 g/dL (ref 32.0–36.0)
MCV: 82 fL (ref 80.0–100.0)
MONOS PCT: 10 %
Monocytes Absolute: 2.6 10*3/uL — ABNORMAL HIGH (ref 0.2–1.0)
Neutro Abs: 19.4 10*3/uL — ABNORMAL HIGH (ref 1.4–6.5)
Neutrophils Relative %: 74 %
Platelets: 517 10*3/uL — ABNORMAL HIGH (ref 150–440)
RBC: 4.21 MIL/uL — AB (ref 4.40–5.90)
RDW: 18.4 % — AB (ref 11.5–14.5)
WBC: 26.2 10*3/uL — AB (ref 3.8–10.6)

## 2015-11-27 LAB — COMPREHENSIVE METABOLIC PANEL
ALT: 21 U/L (ref 17–63)
AST: 32 U/L (ref 15–41)
Albumin: 4.4 g/dL (ref 3.5–5.0)
Alkaline Phosphatase: 102 U/L (ref 38–126)
Anion gap: 14 (ref 5–15)
BUN: 66 mg/dL — ABNORMAL HIGH (ref 6–20)
CHLORIDE: 83 mmol/L — AB (ref 101–111)
CO2: 38 mmol/L — ABNORMAL HIGH (ref 22–32)
CREATININE: 2.63 mg/dL — AB (ref 0.61–1.24)
Calcium: 9.5 mg/dL (ref 8.9–10.3)
GFR, EST AFRICAN AMERICAN: 36 mL/min — AB (ref >60)
GFR, EST NON AFRICAN AMERICAN: 31 mL/min — AB (ref >60)
Glucose, Bld: 185 mg/dL — ABNORMAL HIGH (ref 65–99)
POTASSIUM: 2.6 mmol/L — AB (ref 3.5–5.1)
Sodium: 135 mmol/L (ref 135–145)
TOTAL PROTEIN: 8 g/dL (ref 6.5–8.1)
Total Bilirubin: 0.5 mg/dL (ref 0.3–1.2)

## 2015-11-27 LAB — MAGNESIUM: Magnesium: 2.7 mg/dL — ABNORMAL HIGH (ref 1.7–2.4)

## 2015-11-27 LAB — GLUCOSE, CAPILLARY: GLUCOSE-CAPILLARY: 157 mg/dL — AB (ref 65–99)

## 2015-11-27 LAB — ABO/RH: ABO/RH(D): O POS

## 2015-11-27 LAB — LACTIC ACID, PLASMA: LACTIC ACID, VENOUS: 2.2 mmol/L — AB (ref 0.5–2.0)

## 2015-11-27 MED ORDER — PIPERACILLIN-TAZOBACTAM 3.375 G IVPB
3.3750 g | Freq: Once | INTRAVENOUS | Status: AC
  Administered 2015-11-27: 3.375 g via INTRAVENOUS
  Filled 2015-11-27: qty 50

## 2015-11-27 MED ORDER — SODIUM CHLORIDE 0.9 % IV SOLN
Freq: Once | INTRAVENOUS | Status: AC
  Administered 2015-11-27: 20:00:00 via INTRAVENOUS

## 2015-11-27 MED ORDER — POTASSIUM CHLORIDE 10 MEQ/100ML IV SOLN
10.0000 meq | Freq: Once | INTRAVENOUS | Status: DC
  Filled 2015-11-27: qty 100

## 2015-11-27 MED ORDER — VANCOMYCIN HCL IN DEXTROSE 1-5 GM/200ML-% IV SOLN: 1000.0000 mg | Freq: Once | INTRAVENOUS | Status: DC

## 2015-11-27 MED ORDER — POTASSIUM CHLORIDE 10 MEQ/100ML IV SOLN
10.0000 meq | INTRAVENOUS | Status: AC
  Administered 2015-11-28 (×4): 10 meq via INTRAVENOUS
  Filled 2015-11-27 (×4): qty 100

## 2015-11-27 MED ORDER — IOHEXOL 240 MG/ML SOLN
25.0000 mL | INTRAMUSCULAR | Status: AC
  Administered 2015-11-27: 25 mL via ORAL

## 2015-11-27 MED ORDER — SODIUM CHLORIDE 0.9 % IV SOLN
80.0000 mg | Freq: Once | INTRAVENOUS | Status: AC
  Administered 2015-11-27: 80 mg via INTRAVENOUS
  Filled 2015-11-27: qty 80

## 2015-11-27 MED ORDER — SODIUM CHLORIDE 0.9 % IV SOLN
Freq: Once | INTRAVENOUS | Status: AC
  Administered 2015-11-27: 22:00:00 via INTRAVENOUS

## 2015-11-27 MED ORDER — PANTOPRAZOLE SODIUM 40 MG IV SOLR: 40.0000 mg | Freq: Two times a day (BID) | INTRAVENOUS | Status: DC

## 2015-11-27 MED ORDER — VANCOMYCIN HCL IN DEXTROSE 1-5 GM/200ML-% IV SOLN
1000.0000 mg | Freq: Once | INTRAVENOUS | Status: AC
  Administered 2015-11-28: 1000 mg via INTRAVENOUS
  Filled 2015-11-27: qty 200

## 2015-11-27 MED ORDER — POTASSIUM CHLORIDE 10 MEQ/100ML IV SOLN: 10.0000 meq | INTRAVENOUS | Status: DC

## 2015-11-27 MED ORDER — SODIUM CHLORIDE 0.9 % IV SOLN
8.0000 mg/h | INTRAVENOUS | Status: DC
  Administered 2015-11-27 – 2015-11-30 (×6): 8 mg/h via INTRAVENOUS
  Filled 2015-11-27 (×6): qty 80

## 2015-11-27 NOTE — ED Notes (Addendum)
Hypotensive, black loose stools x 3 days.  30yo male, gastroparesis constipation for a few days.  Self treated with doculax, now having diarrhea with black tarry stools. Incontinent of stool on arrival.  nv yesterday and day before.  74/43 bp, 78% spo2, pale color, alert and orietned, lungs clear.  4 liters spo2 increased. Hypotensive at 76/43
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
## 2015-11-27 NOTE — ED Notes (Signed)
Patient transported to CT 

## 2015-11-27 NOTE — H&P (Addendum)
[place] [facility] [place] NAME: Maurice Davidson    MR#:  [id number]  DATE OF BIRTH:  08-09-1986  DATE OF ADMISSION:  11/27/2015  PRIMARY CARE PHYSICIAN: No primary care provider on file.   REQUESTING/REFERRING PHYSICIAN: Cinda Quest, MD  CHIEF COMPLAINT:   Chief Complaint  Patient presents with  . GI Bleeding  . Diarrhea    HISTORY OF PRESENT ILLNESS:  Maurice Davidson  is a 30 y.o. male who presents with an episode of large volume dark tarry stool after 5 days constipation. Patient states that he constipated for the last 4-5 days, and had decreased the point where his abdomen in the epigastric region was causing him a lot of discomfort. He had significant associated nausea or vomiting. He was taking Colace over-the-counter, and today he had several large volume bowel movements. He describes these as diarrhea and black. He came in the ED for evaluation. Here he was found to be hypotensive, tachycardic, with an elevated white blood cell count. Hemoccult was strongly positive. He was also hypokalemic with a mild lactic acidosis and significant AKI. CT abdomen and pelvis did not show any acute abnormality. Patient denies any fevers/chills, dysuria, her other infectious symptoms beyond those listed above. Hospitalists were called for admission for sepsis and GI bleed.  PAST MEDICAL HISTORY:   Past Medical History  Diagnosis Date  . Diabetes mellitus without complication ([place])   . Seizures ([place])   . Renal disorder   . Anemia   . MRSA (methicillin resistant staph aureus) culture positive   . Sepsis ([place])   . Aspiration pneumonia ([place])   . H/O: GI bleed   . Gastroparesis   . Encephalopathy   . Chronic pain     take Suboxone  . Drug-seeking behavior   . Opioid dependence ([place])   . HTN (hypertension)   . Anxiety     PAST SURGICAL HISTORY:   Past Surgical History  Procedure Laterality Date  . Orthopedic surgery      SOCIAL HISTORY:    Social History  Substance Use Topics  . Smoking status: Current Some Day Smoker  . Smokeless tobacco: Not on file  . Alcohol Use: No    FAMILY HISTORY:   Family History  Problem Relation Age of Onset  . Clotting disorder Sister     Von Willebrand's disease plus polycythemia vera  . Hypertension Mother     DRUG ALLERGIES:   Allergies  Allergen Reactions  . Abilify [Aripiprazole] Rash    MEDICATIONS AT HOME:   Prior to Admission medications   Medication Sig Start Date End Date Taking? Authorizing Provider  ALPRAZolam Duanne Moron) 1 MG tablet Take 1 mg by mouth 3 (three) times daily as needed for anxiety.   Yes Historical Provider, MD  amLODipine (NORVASC) 5 MG tablet Take 1 tablet (5 mg total) by mouth daily. 06/04/15  Yes Vaughan Basta, MD  aspirin EC 81 MG tablet Take 81 mg by mouth daily.   Yes Historical Provider, MD  budesonide-formoterol (SYMBICORT) 160-4.5 MCG/ACT inhaler Inhale 2 puffs into the lungs 2 (two) times daily.   Yes Historical Provider, MD  Buprenorphine HCl-Naloxone HCl (SUBOXONE) 8-2 MG FILM Place 0.25 Film under the tongue daily.   Yes Historical Provider, MD  busPIRone (BUSPAR) 15 MG tablet Take 15 mg by mouth 3 (three) times daily.   Yes Historical Provider, MD  celecoxib (CELEBREX) 100 MG capsule Take 100 mg by mouth 2 (two) times daily.   Yes  Historical Provider, MD  diclofenac sodium (VOLTAREN) 1 % GEL Apply 2 g topically 4 (four) times daily as needed (for pain).    Yes Historical Provider, MD  gabapentin (NEURONTIN) 300 MG capsule Take 1,200 mg by mouth 3 (three) times daily.    Yes Historical Provider, MD  hydrOXYzine (VISTARIL) 25 MG capsule Take 25 mg by mouth 3 (three) times daily as needed for anxiety.   Yes Historical Provider, MD  ibuprofen (ADVIL,MOTRIN) 200 MG tablet Take 600-800 mg by mouth every 6 (six) hours as needed for headache or mild pain.   Yes Historical Provider, MD  insulin aspart (NOVOLOG) 100 UNIT/ML injection Inject 5  Units into the skin 3 (three) times daily with meals. Pt is to increase by one unit per every 12 carbs consumed.  Max of 20 units daily.   Yes Historical Provider, MD  insulin glargine (LANTUS) 100 UNIT/ML injection Inject 12 Units into the skin at bedtime.    Yes Historical Provider, MD  metoCLOPramide (REGLAN) 10 MG tablet Take 10 mg by mouth 3 (three) times daily before meals.   Yes Historical Provider, MD  Multiple Vitamins-Minerals (MULTIVITAMIN WITH MINERALS) tablet Take 1 tablet by mouth daily.   Yes Historical Provider, MD  pantoprazole (PROTONIX) 20 MG tablet Take 20 mg by mouth 2 (two) times daily.   Yes Historical Provider, MD  sertraline (ZOLOFT) 50 MG tablet Take 150 mg by mouth daily.   Yes Historical Provider, MD  traZODone (DESYREL) 50 MG tablet Take 150 mg by mouth at bedtime.   Yes Historical Provider, MD    REVIEW OF SYSTEMS:  Review of Systems  Constitutional: Negative for fever, chills, weight loss and malaise/fatigue.  HENT: Negative for ear pain, hearing loss and tinnitus.   Eyes: Negative for blurred vision, double vision, pain and redness.  Respiratory: Negative for cough, hemoptysis and shortness of breath.   Cardiovascular: Negative for chest pain, palpitations, orthopnea and leg swelling.  Gastrointestinal: Positive for nausea, vomiting, abdominal pain and melena. Negative for diarrhea and constipation.  Genitourinary: Negative for dysuria, frequency and hematuria.  Musculoskeletal: Negative for back pain, joint pain and neck pain.  Skin:       No acne, rash, or lesions  Neurological: Negative for dizziness, tremors, focal weakness and weakness.  Endo/Heme/Allergies: Negative for polydipsia. Does not bruise/bleed easily.  Psychiatric/Behavioral: Negative for depression. The patient is not nervous/anxious and does not have insomnia.      VITAL SIGNS:   Filed Vitals:   11/27/15 2212 11/27/15 2228 11/27/15 2237 11/27/15 2245  BP: 111/74  89/78 116/80  Pulse:  114  105 112  Temp:  97.8 F (36.6 C)    TempSrc:  Oral    Resp: 30  22 31   Height:      Weight:      SpO2: 97%  97% 97%   Wt Readings from Last 3 Encounters:  11/27/15 56.7 kg (125 lb)  06/04/15 50.077 kg (110 lb 6.4 oz)  02/01/15 54.432 kg (120 lb)    PHYSICAL EXAMINATION:  Physical Exam  Vitals reviewed. Constitutional: He is oriented to person, place, and time. He appears well-developed and well-nourished. No distress.  HENT:  Head: Normocephalic and atraumatic.  Dry mucous membranes  Eyes: Conjunctivae and EOM are normal. Pupils are equal, round, and reactive to light. No scleral icterus.  Neck: Normal range of motion. Neck supple. No JVD present. No thyromegaly present.  Cardiovascular: Regular rhythm and intact distal pulses.  Exam reveals no gallop and  no friction rub.   No murmur heard. Tachycardic  Respiratory: Effort normal and breath sounds normal. No respiratory distress. He has no wheezes. He has no rales.  GI: Soft. Bowel sounds are normal. He exhibits no distension. There is no tenderness.  Musculoskeletal: Normal range of motion. He exhibits no edema.  No arthritis, no gout  Lymphadenopathy:    He has no cervical adenopathy.  Neurological: He is alert and oriented to person, place, and time. No cranial nerve deficit.  No dysarthria, no aphasia  Skin: Skin is warm and dry. No rash noted. No erythema.  Psychiatric: He has a normal mood and affect. His behavior is normal. Judgment and thought content normal.    LABORATORY PANEL:   CBC  Recent Labs Lab 11/27/15 1959  WBC 26.2*  HGB 11.4*  HCT 34.5*  PLT 517*   ------------------------------------------------------------------------------------------------------------------  Chemistries   Recent Labs Lab 11/27/15 1959  NA 135  K 2.6*  CL 83*  CO2 38*  GLUCOSE 185*  BUN 66*  CREATININE 2.63*  CALCIUM 9.5  AST 32  ALT 21  ALKPHOS 102  BILITOT 0.5    ------------------------------------------------------------------------------------------------------------------  Cardiac Enzymes No results for input(s): TROPONINI in the last 168 hours. ------------------------------------------------------------------------------------------------------------------  RADIOLOGY:  Ct Abdomen Pelvis Wo Contrast  11/27/2015  CLINICAL DATA:  Abdominal pain and sepsis. Hypotension and black stools for 3 days. EXAM: CT ABDOMEN AND PELVIS WITHOUT CONTRAST TECHNIQUE: Multidetector CT imaging of the abdomen and pelvis was performed following the standard protocol without IV contrast. COMPARISON:  11/19/2013 FINDINGS: Lower chest and abdominal wall: Mild circumferential lower esophageal thickening, significantly improved since 2015. No basilar pneumonia Hepatobiliary: No focal liver abnormality.No evidence of biliary obstruction or stone. Pancreas: Unremarkable. Spleen: Small with lobulation and calcification, likely posttraumatic Adrenals/Urinary Tract: Negative adrenals. Asymmetric right renal atrophy. 2 mm lower pole calculus on the left. No hydronephrosis. Unremarkable bladder. Reproductive:No pathologic findings. Stomach/Bowel: No obstruction or inflammatory change. The appendix is not clearly seen but there is no secondary signs of appendicitis. No visible gastric ulceration or inflammation. Vascular/Lymphatic: No acute vascular abnormality. No mass or adenopathy. Peritoneal: No ascites or pneumoperitoneum. Musculoskeletal: Negative. IMPRESSION: No acute finding.  No explanation for sepsis. Electronically Signed   By: Monte Fantasia M.D.   On: 11/27/2015 22:24   Dg Chest Portable 1 View  11/27/2015  CLINICAL DATA:  Hypotension EXAM: PORTABLE CHEST 1 VIEW COMPARISON:  06/03/2015 FINDINGS: Normal heart size. Lungs clear. No pneumothorax. No pleural effusion. IMPRESSION: No active disease. Electronically Signed   By: Marybelle Killings M.D.   On: 11/27/2015 21:55    EKG:    Orders placed or performed during the hospital encounter of 11/27/15  . ED EKG  . ED EKG  . EKG 12-Lead  . EKG 12-Lead    IMPRESSION AND PLAN:  Principal Problem:   Severe Sepsis ([place]) - Zosyn and vancomycin given in the ED, continue these on admission. Blood and urine cultures sent from the ED. Serial check lactic acid, aggressive fluids for blood pressure support and hydration. Active Problems:   GI bleed - type and screen performed. Patient's hemoglobin seems stable, however we will trend this every 6 for now. GI consult for potential endoscopy and/or colonoscopy.   AKI (acute kidney injury) ([place]) - suspect severe prerenal insult given his GI bleed as well as possible sepsis and severe dehydration. Aggressive fluids as above, monitor for expected improvement. Nephrotoxins.   Hypokalemia - replace and monitor   Diabetes mellitus type 1 ([place]) -  nothing by mouth now for expected above procedures. Sliding scale insulin every 6 hours with corresponding glucose checks while nothing by mouth.   Anxiety - continue home anxiolytics   HTN (hypertension) - hold antihypertensives for now as blood pressure is low. Fluids above for blood pressure support.   Seizures ([place]) - not on antiepileptics at home, we will order seizure precautions while here.  All the records are reviewed and case discussed with ED provider. Management plans discussed with the patient and/or family.  DVT PROPHYLAXIS: Mechanical only  GI PROPHYLAXIS: PPI  ADMISSION STATUS: Inpatient  CODE STATUS: Full Code Status History    Date Active Date Inactive Code Status Order ID Comments User Context   06/02/2015 10:02 AM 06/04/2015  2:55 PM Full Code [id number]  Gladstone Lighter, MD Inpatient      TOTAL CRITICAL CARE TIME TAKING CARE OF THIS PATIENT: 45 minutes.    Breasia Karges [place] 11/27/2015, 11:05 PM  [facility]  Office  [phone]  CC: Primary care physician; No primary care provider on  file.

## 2015-11-28 ENCOUNTER — Encounter: Payer: Self-pay | Admitting: Anesthesiology

## 2015-11-28 ENCOUNTER — Encounter
Admission: EM | Discharge status: Against Medical Advice | Payer: Self-pay | Source: Home / Self Care | Attending: Internal Medicine

## 2015-11-28 LAB — BASIC METABOLIC PANEL
ANION GAP: 6 (ref 5–15)
Anion gap: 6 (ref 5–15)
BUN: 42 mg/dL — AB (ref 6–20)
BUN: 34 mg/dL — ABNORMAL HIGH (ref 6–20)
CALCIUM: 7.7 mg/dL — AB (ref 8.9–10.3)
CALCIUM: 8.1 mg/dL — AB (ref 8.9–10.3)
CO2: 25 mmol/L (ref 22–32)
CO2: 22 mmol/L (ref 22–32)
CREATININE: 1.53 mg/dL — AB (ref 0.61–1.24)
CREATININE: 1.43 mg/dL — AB (ref 0.61–1.24)
Chloride: 107 mmol/L (ref 101–111)
Chloride: 110 mmol/L (ref 101–111)
GFR calc non Af Amer: 60 mL/min — ABNORMAL LOW (ref >60)
GFR calc non Af Amer: >60 mL/min (ref >60)
GLUCOSE: 295 mg/dL — AB (ref 65–99)
Glucose, Bld: 107 mg/dL — ABNORMAL HIGH (ref 65–99)
Potassium: 3 mmol/L — ABNORMAL LOW (ref 3.5–5.1)
Potassium: 4.5 mmol/L (ref 3.5–5.1)
Sodium: 138 mmol/L (ref 135–145)
Sodium: 138 mmol/L (ref 135–145)

## 2015-11-28 LAB — URINE DRUG SCREEN, QUALITATIVE (ARMC ONLY)
Amphetamines, Ur Screen: NOT DETECTED
Barbiturates, Ur Screen: NOT DETECTED
Benzodiazepine, Ur Scrn: POSITIVE — AB
Cannabinoid 50 Ng, Ur ~~LOC~~: POSITIVE — AB
Cocaine Metabolite,Ur ~~LOC~~: POSITIVE — AB
MDMA (Ecstasy)Ur Screen: NOT DETECTED
Methadone Scn, Ur: NOT DETECTED
Opiate, Ur Screen: NOT DETECTED
Phencyclidine (PCP) Ur S: NOT DETECTED
Tricyclic, Ur Screen: NOT DETECTED

## 2015-11-28 LAB — CBC
HCT: 25.8 % — ABNORMAL LOW (ref 40.0–52.0)
Hemoglobin: 8.6 g/dL — ABNORMAL LOW (ref 13.0–18.0)
MCH: 27.9 pg (ref 26.0–34.0)
MCHC: 33.5 g/dL (ref 32.0–36.0)
MCV: 83.5 fL (ref 80.0–100.0)
PLATELETS: 360 10*3/uL (ref 150–440)
RBC: 3.1 MIL/uL — ABNORMAL LOW (ref 4.40–5.90)
RDW: 18.1 % — AB (ref 11.5–14.5)
WBC: 18.2 10*3/uL — AB (ref 3.8–10.6)

## 2015-11-28 LAB — HEMOGLOBIN
HEMOGLOBIN: 8.9 g/dL — AB (ref 13.0–18.0)
HEMOGLOBIN: 8.4 g/dL — AB (ref 13.0–18.0)
Hemoglobin: 8.5 g/dL — ABNORMAL LOW (ref 13.0–18.0)

## 2015-11-28 LAB — GLUCOSE, CAPILLARY
Glucose-Capillary: 126 mg/dL — ABNORMAL HIGH (ref 65–99)
Glucose-Capillary: 98 mg/dL (ref 65–99)
Glucose-Capillary: 256 mg/dL — ABNORMAL HIGH (ref 65–99)
Glucose-Capillary: 366 mg/dL — ABNORMAL HIGH (ref 65–99)
Glucose-Capillary: 353 mg/dL — ABNORMAL HIGH (ref 65–99)

## 2015-11-28 LAB — POTASSIUM: POTASSIUM: 2.7 mmol/L — AB (ref 3.5–5.1)

## 2015-11-28 LAB — MRSA PCR SCREENING: MRSA by PCR: POSITIVE — AB

## 2015-11-28 LAB — LACTIC ACID, PLASMA: LACTIC ACID, VENOUS: 0.9 mmol/L (ref 0.5–2.0)

## 2015-11-28 SURGERY — EGD (ESOPHAGOGASTRODUODENOSCOPY)
Anesthesia: General

## 2015-11-28 MED ORDER — INSULIN ASPART 100 UNIT/ML ~~LOC~~ SOLN
0.0000 [IU] | Freq: Four times a day (QID) | SUBCUTANEOUS | Status: DC
  Administered 2015-11-28: 9 [IU] via SUBCUTANEOUS
  Administered 2015-11-28: 5 [IU] via SUBCUTANEOUS
  Filled 2015-11-28: qty 5
  Filled 2015-11-28: qty 9

## 2015-11-28 MED ORDER — MOMETASONE FURO-FORMOTEROL FUM 200-5 MCG/ACT IN AERO
2.0000 | INHALATION_SPRAY | Freq: Two times a day (BID) | RESPIRATORY_TRACT | Status: DC
Start: 2015-11-28 — End: 2015-11-30
  Administered 2015-11-28 – 2015-11-30 (×6): 2 via RESPIRATORY_TRACT
  Filled 2015-11-28 (×2): qty 8.8

## 2015-11-28 MED ORDER — ONDANSETRON HCL 4 MG/2ML IJ SOLN: 4.0000 mg | Freq: Four times a day (QID) | INTRAMUSCULAR | Status: DC | PRN

## 2015-11-28 MED ORDER — CHLORHEXIDINE GLUCONATE CLOTH 2 % EX PADS
6.0000 | MEDICATED_PAD | Freq: Every day | CUTANEOUS | Status: DC
  Administered 2015-11-29 – 2015-11-30 (×2): 6 via TOPICAL

## 2015-11-28 MED ORDER — SODIUM CHLORIDE 0.9 % IV SOLN
INTRAVENOUS | Status: DC
  Administered 2015-11-28: 01:00:00 via INTRAVENOUS

## 2015-11-28 MED ORDER — POTASSIUM CHLORIDE IN NACL 40-0.9 MEQ/L-% IV SOLN
INTRAVENOUS | Status: DC
  Administered 2015-11-28 – 2015-11-29 (×3): 100 mL/h via INTRAVENOUS
  Filled 2015-11-28 (×5): qty 1000

## 2015-11-28 MED ORDER — SERTRALINE HCL 100 MG PO TABS
150.0000 mg | ORAL_TABLET | Freq: Every day | ORAL | Status: DC
  Administered 2015-11-28 – 2015-11-30 (×3): 150 mg via ORAL
  Filled 2015-11-28 (×2): qty 3
  Filled 2015-11-28: qty 1

## 2015-11-28 MED ORDER — ACETAMINOPHEN 650 MG RE SUPP: 650.0000 mg | Freq: Four times a day (QID) | RECTAL | Status: DC | PRN

## 2015-11-28 MED ORDER — VANCOMYCIN HCL IN DEXTROSE 750-5 MG/150ML-% IV SOLN
750.0000 mg | INTRAVENOUS | Status: DC
  Filled 2015-11-28: qty 150

## 2015-11-28 MED ORDER — MORPHINE SULFATE (PF) 4 MG/ML IV SOLN
3.0000 mg | Freq: Four times a day (QID) | INTRAVENOUS | Status: DC | PRN
  Administered 2015-11-28: 3 mg via INTRAVENOUS
  Administered 2015-11-28: 1 mg via INTRAVENOUS
  Administered 2015-11-28 – 2015-11-29 (×2): 4 mg via INTRAVENOUS
  Filled 2015-11-28 (×4): qty 1

## 2015-11-28 MED ORDER — HYDRALAZINE HCL 20 MG/ML IJ SOLN
10.0000 mg | INTRAMUSCULAR | Status: DC | PRN
  Administered 2015-11-29: 10 mg via INTRAVENOUS
  Filled 2015-11-28: qty 1

## 2015-11-28 MED ORDER — INSULIN ASPART 100 UNIT/ML ~~LOC~~ SOLN
0.0000 [IU] | Freq: Three times a day (TID) | SUBCUTANEOUS | Status: DC
Start: 2015-11-29 — End: 2015-11-28

## 2015-11-28 MED ORDER — VANCOMYCIN HCL IN DEXTROSE 750-5 MG/150ML-% IV SOLN
750.0000 mg | INTRAVENOUS | Status: DC
Start: 2015-11-28 — End: 2015-11-28
  Filled 2015-11-28: qty 150

## 2015-11-28 MED ORDER — ALPRAZOLAM 1 MG PO TABS
1.0000 mg | ORAL_TABLET | Freq: Three times a day (TID) | ORAL | Status: DC | PRN
  Administered 2015-11-28 – 2015-11-30 (×4): 1 mg via ORAL
  Filled 2015-11-28 (×4): qty 1

## 2015-11-28 MED ORDER — INSULIN ASPART 100 UNIT/ML ~~LOC~~ SOLN
0.0000 [IU] | Freq: Three times a day (TID) | SUBCUTANEOUS | Status: DC
  Administered 2015-11-28 – 2015-11-29 (×2): 15 [IU] via SUBCUTANEOUS
  Administered 2015-11-29: 5 [IU] via SUBCUTANEOUS
  Administered 2015-11-29: 1 [IU] via SUBCUTANEOUS
  Administered 2015-11-30 (×2): 2 [IU] via SUBCUTANEOUS
  Filled 2015-11-28: qty 15
  Filled 2015-11-28: qty 5
  Filled 2015-11-28: qty 2
  Filled 2015-11-28: qty 3
  Filled 2015-11-28: qty 15
  Filled 2015-11-28: qty 2

## 2015-11-28 MED ORDER — MUPIROCIN 2 % EX OINT
1.0000 | TOPICAL_OINTMENT | Freq: Two times a day (BID) | CUTANEOUS | Status: DC
Start: 2015-11-28 — End: 2015-11-30
  Administered 2015-11-28 – 2015-11-29 (×4): 1 via NASAL
  Filled 2015-11-28: qty 22

## 2015-11-28 MED ORDER — INSULIN ASPART 100 UNIT/ML ~~LOC~~ SOLN: 0.0000 [IU] | Freq: Every day | SUBCUTANEOUS | Status: DC

## 2015-11-28 MED ORDER — BUPRENORPHINE HCL 2 MG SL SUBL
2.0000 mg | SUBLINGUAL_TABLET | Freq: Every day | SUBLINGUAL | Status: DC
  Administered 2015-11-28: 2 mg via SUBLINGUAL
  Filled 2015-11-28: qty 1

## 2015-11-28 MED ORDER — GABAPENTIN 300 MG PO CAPS
1200.0000 mg | ORAL_CAPSULE | Freq: Two times a day (BID) | ORAL | Status: DC
  Administered 2015-11-28 (×2): 1200 mg via ORAL
  Filled 2015-11-28 (×2): qty 4

## 2015-11-28 MED ORDER — POTASSIUM CHLORIDE CRYS ER 20 MEQ PO TBCR
40.0000 meq | EXTENDED_RELEASE_TABLET | ORAL | Status: AC
  Administered 2015-11-28 (×2): 40 meq via ORAL
  Filled 2015-11-28 (×2): qty 2

## 2015-11-28 MED ORDER — SODIUM CHLORIDE 0.9% FLUSH
3.0000 mL | Freq: Two times a day (BID) | INTRAVENOUS | Status: DC
  Administered 2015-11-28 – 2015-11-29 (×4): 3 mL via INTRAVENOUS

## 2015-11-28 MED ORDER — ONDANSETRON HCL 4 MG PO TABS: 4.0000 mg | ORAL_TABLET | Freq: Four times a day (QID) | ORAL | Status: DC | PRN

## 2015-11-28 MED ORDER — PIPERACILLIN-TAZOBACTAM 3.375 G IVPB
3.3750 g | Freq: Three times a day (TID) | INTRAVENOUS | Status: DC
  Administered 2015-11-28: 3.375 g via INTRAVENOUS
  Filled 2015-11-28 (×3): qty 50

## 2015-11-28 MED ORDER — ACETAMINOPHEN 325 MG PO TABS: 650.0000 mg | ORAL_TABLET | Freq: Four times a day (QID) | ORAL | Status: DC | PRN

## 2015-11-28 MED ORDER — BUSPIRONE HCL 10 MG PO TABS
15.0000 mg | ORAL_TABLET | Freq: Three times a day (TID) | ORAL | Status: DC
  Administered 2015-11-28 – 2015-11-30 (×7): 15 mg via ORAL
  Filled 2015-11-28: qty 2
  Filled 2015-11-28 (×2): qty 3
  Filled 2015-11-28 (×2): qty 2
  Filled 2015-11-28 (×2): qty 3

## 2015-11-28 NOTE — Care Management (Signed)
Admitted to icu stepdown for severe sepsis and acute renal failure.  Black tarry stool with hemoccult positive stools.  Hgb 8.6 this morning.  He has history of chronic pain, constipation, IDDM with gastroparesis.

## 2015-11-28 NOTE — Progress Notes (Signed)
[place] [facility] [place] NAME: Maurice Davidson    MR#:  [id number]  DATE OF BIRTH:  1985/11/05  SUBJECTIVE:  CHIEF COMPLAINT:   Chief Complaint  Patient presents with  . GI Bleeding  . Diarrhea   - Admitted with melena, weakness- hypotensive on admission adn also tachycardic - hemoccult positive. Hb dropped but stable now - on protonix drip, GI consult and possible EGD later today - patient requesting for food.  REVIEW OF SYSTEMS:  Review of Systems  Constitutional: Negative for fever and chills.  HENT: Negative for ear discharge, ear pain and tinnitus.   Eyes: Negative for blurred vision and double vision.  Respiratory: Negative for cough, shortness of breath and wheezing.   Cardiovascular: Negative for chest pain, palpitations and leg swelling.  Gastrointestinal: Positive for abdominal pain. Negative for nausea, vomiting, diarrhea and constipation.  Genitourinary: Negative for dysuria, urgency and frequency.  Musculoskeletal: Negative for myalgias.  Neurological: Positive for weakness. Negative for dizziness, tremors, sensory change, speech change, focal weakness, seizures and headaches.  Psychiatric/Behavioral: Negative for depression.    DRUG ALLERGIES:   Allergies  Allergen Reactions  . Abilify [Aripiprazole] Rash    VITALS:  Blood pressure 126/86, pulse 106, temperature 98.9 F (37.2 C), temperature source Oral, resp. rate 13, height 5\' 6"  (1.676 m), weight 56.7 kg (125 lb), SpO2 94 %.  PHYSICAL EXAMINATION:  Physical Exam  GENERAL:  30 y.o.-year-old di shelved patient lying in the bed with no acute distress.  EYES: Pupils equal, round, reactive to light and accommodation. No scleral icterus. Extraocular muscles intact.  HEENT: Head atraumatic, normocephalic. Oropharynx and nasopharynx clear. Poor oral hyegine NECK:  Supple, no jugular venous distention. No thyroid enlargement, no tenderness.  LUNGS: Normal breath  sounds bilaterally, no wheezing, rales,rhonchi or crepitation. No use of accessory muscles of respiration.  CARDIOVASCULAR: S1, S2 normal. No murmurs, rubs, or gallops.  ABDOMEN: Soft, nontender but discomfort to palpation in epigastric region, nondistended. Bowel sounds present. No organomegaly or mass.  EXTREMITIES: No pedal edema, cyanosis, or clubbing.  NEUROLOGIC: Cranial nerves II through XII are intact. Muscle strength 5/5 in all extremities. Sensation intact. Gait not checked.  PSYCHIATRIC: The patient is alert and oriented x 3.  SKIN: No obvious rash, lesion, or ulcer.    LABORATORY PANEL:   CBC  Recent Labs Lab 11/28/15 0659  WBC 18.2*  HGB 8.6*  HCT 25.8*  PLT 360   ------------------------------------------------------------------------------------------------------------------  Chemistries   Recent Labs Lab 11/27/15 1959  11/28/15 0659  NA 135  --  138  K 2.6*  < > 3.0*  CL 83*  --  107  CO2 38*  --  25  GLUCOSE 185*  --  107*  BUN 66*  --  42*  CREATININE 2.63*  --  1.53*  CALCIUM 9.5  --  7.7*  MG 2.7*  --   --   AST 32  --   --   ALT 21  --   --   ALKPHOS 102  --   --   BILITOT 0.5  --   --   < > = values in this interval not displayed. ------------------------------------------------------------------------------------------------------------------  Cardiac Enzymes No results for input(s): TROPONINI in the last 168 hours. ------------------------------------------------------------------------------------------------------------------  RADIOLOGY:  Ct Abdomen Pelvis Wo Contrast  11/27/2015  CLINICAL DATA:  Abdominal pain and sepsis. Hypotension and black stools for 3 days. EXAM: CT ABDOMEN AND PELVIS WITHOUT CONTRAST TECHNIQUE: Multidetector CT imaging of the  abdomen and pelvis was performed following the standard protocol without IV contrast. COMPARISON:  11/19/2013 FINDINGS: Lower chest and abdominal wall: Mild circumferential lower esophageal  thickening, significantly improved since 2015. No basilar pneumonia Hepatobiliary: No focal liver abnormality.No evidence of biliary obstruction or stone. Pancreas: Unremarkable. Spleen: Small with lobulation and calcification, likely posttraumatic Adrenals/Urinary Tract: Negative adrenals. Asymmetric right renal atrophy. 2 mm lower pole calculus on the left. No hydronephrosis. Unremarkable bladder. Reproductive:No pathologic findings. Stomach/Bowel: No obstruction or inflammatory change. The appendix is not clearly seen but there is no secondary signs of appendicitis. No visible gastric ulceration or inflammation. Vascular/Lymphatic: No acute vascular abnormality. No mass or adenopathy. Peritoneal: No ascites or pneumoperitoneum. Musculoskeletal: Negative. IMPRESSION: No acute finding.  No explanation for sepsis. Electronically Signed   By: Monte Fantasia M.D.   On: 11/27/2015 22:24   Dg Chest Portable 1 View  11/27/2015  CLINICAL DATA:  Hypotension EXAM: PORTABLE CHEST 1 VIEW COMPARISON:  06/03/2015 FINDINGS: Normal heart size. Lungs clear. No pneumothorax. No pleural effusion. IMPRESSION: No active disease. Electronically Signed   By: Marybelle Killings M.D.   On: 11/27/2015 21:55    EKG:   Orders placed or performed during the hospital encounter of 11/27/15  . ED EKG  . ED EKG  . EKG 12-Lead  . EKG 12-Lead    ASSESSMENT AND PLAN:   30 year old male with past medical history significant for type 1 diabetes mellitus, history of aspiration pneumonia, gastroparesis chronic pain on Suboxone, hypertension and opioid dependence presents to the hospital secondary to anemia and possible GI bleed.  #1 GI bleed-likely upper GI bleed. NSAID induced. Taking celecoxib and also large doses of Motrin at home for his pain. -Continue Protonix drip. Remains nothing by mouth. -GI has been consulted. Likely EGD today.  #2 anemia-acute on chronic anemia secondary to GI bleed. -Baseline hemoglobin seems to be  between 10 and 11. Dropped to 8.5 last night with fluids. -Remains stable at 8.5 today. No active bleeding but continues to have melanotic stools. -Transfuse as needed. We'll transfuse if hemoglobin goes down to 8 or less.  #3 hypokalemia-being replaced aggressively. Follow up later today.  #4 acute renal failure-prerenal and ATN secondary to hypotension. -Continue IV fluids. Improvement is seen. Avoid nephrotoxins.  #5 sepsis-unknown source at this time. Not sure if sepsis is present. Continue to follow up cultures. WBC is elevated at this time. Continue vancomycin and Zosyn.  #6 chronic pain-continue gabapentin. Will need to be started back on Suboxone  #7 diabetes mellitus-currently nothing by mouth. Continue sliding scale insulin for now.  #8 DVT prophylaxis-Ted's and SCDs due to GI bleed. Hold off heparin products.   Will remain in ICU today.  All the records are reviewed and case discussed with Care Management/Social Workerr. Management plans discussed with the patient, family and they are in agreement.  CODE STATUS: Full code  TOTAL CRITICAL CARE TIME SPENT IN TAKING CARE OF THIS PATIENT: 42 minutes.   POSSIBLE D/C IN 2-3 DAYS, DEPENDING ON CLINICAL CONDITION.   Gladstone Lighter M.D on 11/28/2015 at 9:00 AM  Between 7am to 6pm - Pager - [phone]  After 6pm go to [email or web address] - password [email or web address] [facility]  [facility]  Office  [phone]  CC: Primary care physician; No primary care provider on file.

## 2015-11-28 NOTE — Consult Note (Signed)
Patient with heavy NSAID use and onset of vomiting black material and passing melena.  He has a long hx of diabetes. Previous colon and EGD 10 years ago.  His urine tested positive for cocaine and of course he doesn't know how it could be possible since he has not supposedly used any.   With his urine testing positive and his hgb holding steady and no further hematemesis I think I will recommend continued PPI and will start minimal clear liquids for now and repeat urine test in 2 days to see if can scope him then.  If he were to have life threatening hemorrhage will then proceed with urgent upper endoscopy despite the presence of cocaine in his system.Marland Kitchen

## 2015-11-28 NOTE — Progress Notes (Signed)
Notified MD about patient's pain level and blood pressure trending up. MD ordered subutex discontinued, morphine 3-4 mg IV prn every 6 hours for severe pain, and hydralazine 10 mg IV every 4 hours prn for systolic blood pressure greater than 0000000 or diastolic blood pressure greater than 105. MD did not want to order home anti-hypertensives due to GI bleed and low blood pressure in ED.

## 2015-11-28 NOTE — Plan of Care (Signed)
Problem: Bowel/Gastric: Goal: Will show no signs and symptoms of gastrointestinal bleeding Outcome: Progressing Several bowel movements during shift but blood pressure remained stable or elevated (MD aware) and bowel movement became more brown (rather than black) throughout shift.

## 2015-11-28 NOTE — Progress Notes (Signed)
Pharmacy Antibiotic Note  LELA GOCHENAUR is a 30 y.o. male admitted on 11/27/2015 with sepsis.  Pharmacy has been consulted for vancomycin and Zosyn dosing.  Zosyn 3.375 g IV x1 given in ED at 2152.   Plan: Vancomycin 1 g IV x1 ordered in ED not yet given. Spoke to a CCU RN that 1g dose has been sent to CCU and needs to be given.  Then Vancomycin 750 IV every 24 hours.  Goal trough 15-20 mcg/mL. Zosyn 3.375g IV q8h (4 hour infusion).   Pt here with acute kidney injury, anticipating renal function to improve. Will hold off on ordering trough at this time and follow renal function. BMET ordered for AM.   Height: 5\' 6"  (167.6 cm) Weight: 125 lb (56.7 kg) IBW/kg (Calculated) : 63.8  Temp (24hrs), Avg:98 F (36.7 C), Min:97.8 F (36.6 C), Max:98.1 F (36.7 C)   Recent Labs Lab 11/27/15 1959 11/27/15 2350  WBC 26.2*  --   CREATININE 2.63*  --   LATICACIDVEN 2.2* 0.9    Estimated Creatinine Clearance: 32.9 mL/min (by C-G formula based on Cr of 2.63).    Allergies  Allergen Reactions  . Abilify [Aripiprazole] Rash    Antimicrobials this admission: Vancomycin 3/14 >>  Zosyn 3/13 >>   Dose adjustments this admission:   Microbiology results: 3/13 BCx: sent 3/13 UCx sent 3/13 MRSA PCR: sent  Thank you for allowing pharmacy to be a part of this patient's care.  Rocky Morel 11/28/2015 1:23 AM
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
## 2015-11-28 NOTE — Progress Notes (Signed)
Spoke with Dr. Tressia Miners about patient supposedly being tested for C Diff but no orders or sample sent. MD did order C Diff sample, patient's bowel movements frequent, black, soft, and tarry but not loose. RN also informed MD about patient's request for his home gabapentin. MD to order for patient.

## 2015-11-28 NOTE — Progress Notes (Signed)
Spoke w/ Dr. Benjie Karvonen r/t pt.'s CBG levels. Discussed pt.'s admission dx and previous CBG results.  Suggested changing pt. From sensitive to moderate scale. MD agreed. Will continue to monitor pt. Closely.

## 2015-11-28 NOTE — Progress Notes (Signed)
Pharmacy Antibiotic Note  Maurice Davidson is a 30 y.o. male admitted on 11/27/2015 with sepsis.  Pharmacy has been consulted for vancomycin and Zosyn dosing.   Plan: Will increase vancomycin to 750 mg iv q 18 hours as renal function is improving. Goal trough 15-20 mcg/ml.  Continue Zosyn 3.375 g EI q 8 hours.   Pt here with acute kidney injury, anticipating renal function to improve. Will hold off on ordering trough at this time and follow renal function. BMET ordered for AM.   Height: 5\' 6"  (167.6 cm) Weight: 125 lb (56.7 kg) IBW/kg (Calculated) : 63.8  Temp (24hrs), Avg:98.4 F (36.9 C), Min:97.8 F (36.6 C), Max:98.9 F (37.2 C)   Recent Labs Lab 11/27/15 1959 11/27/15 2350 11/28/15 0659  WBC 26.2*  --  18.2*  CREATININE 2.63*  --  1.53*  LATICACIDVEN 2.2* 0.9  --     Estimated Creatinine Clearance: 56.6 mL/min (by C-G formula based on Cr of 1.53).    Allergies  Allergen Reactions  . Abilify [Aripiprazole] Rash    Antimicrobials this admission: Vancomycin 3/14 >>  Zosyn 3/13 >>   Dose adjustments this admission:   Microbiology results: 3/13 BCx: NGTD x 2 3/13 UCx sent 3/13 MRSA PCR: positive  Thank you for allowing pharmacy to be a part of this patient's care.  [name] 11/28/2015 12:56 PM

## 2015-11-28 NOTE — Progress Notes (Signed)
Inpatient Diabetes Program Recommendations  AACE/ADA: New Consensus Statement on Inpatient Glycemic Control (2015)  Target Ranges:  Prepandial:   less than 140 mg/dL      Peak postprandial:   less than 180 mg/dL (1-2 hours)      Critically ill patients:  140 - 180 mg/dL   Review of Glycemic Control:  Results for KALIQ, [place] (MRN [id number]) as of 11/28/2015 12:29  Ref. Range 11/27/2015 20:03 11/28/2015 00:51 11/28/2015 06:14  Glucose-Capillary Latest Ref Range: 65-99 mg/dL 157 (H) 126 (H) 98   Diabetes history: Type 1 diabetes Outpatient Diabetes medications: Lantus 12 units, Novolog 5 units tid with meals-increase 1 unit for every 12 grams of CHO Current orders for Inpatient glycemic control:  Novolog sensitive q 6 hours  Inpatient Diabetes Program Recommendations:   Note patient is NPO.  May consider adding 1/2 of patient's home dose of Lantus.  Consider adding Lantus 6 units daily and continue correction q 6 hours.   Thanks, Adah Perl, RN, BC-ADM Inpatient Diabetes Coordinator Pager [phone] (8a-5p)

## 2015-11-28 NOTE — Progress Notes (Signed)
Chaplain rounded the unit and provided a compassionate presence and support to the patient. [phone]

## 2015-11-29 DIAGNOSIS — R079 Chest pain, unspecified: Secondary | ICD-10-CM | POA: Diagnosis not present

## 2015-11-29 LAB — CBC
HEMATOCRIT: 26.3 % — AB (ref 40.0–52.0)
Hemoglobin: 8.4 g/dL — ABNORMAL LOW (ref 13.0–18.0)
MCH: 27.3 pg (ref 26.0–34.0)
MCHC: 31.9 g/dL — ABNORMAL LOW (ref 32.0–36.0)
MCV: 85.5 fL (ref 80.0–100.0)
Platelets: 354 10*3/uL (ref 150–440)
RBC: 3.08 MIL/uL — ABNORMAL LOW (ref 4.40–5.90)
RDW: 18.4 % — AB (ref 11.5–14.5)
WBC: 11.1 10*3/uL — AB (ref 3.8–10.6)

## 2015-11-29 LAB — GLUCOSE, CAPILLARY
GLUCOSE-CAPILLARY: 260 mg/dL — AB (ref 65–99)
GLUCOSE-CAPILLARY: 126 mg/dL — AB (ref 65–99)
GLUCOSE-CAPILLARY: 43 mg/dL — AB (ref 65–99)
Glucose-Capillary: 406 mg/dL — ABNORMAL HIGH (ref 65–99)
Glucose-Capillary: 208 mg/dL — ABNORMAL HIGH (ref 65–99)
Glucose-Capillary: 170 mg/dL — ABNORMAL HIGH (ref 65–99)
Glucose-Capillary: 165 mg/dL — ABNORMAL HIGH (ref 65–99)

## 2015-11-29 LAB — BASIC METABOLIC PANEL
ANION GAP: 6 (ref 5–15)
BUN: 16 mg/dL (ref 6–20)
CALCIUM: 8.1 mg/dL — AB (ref 8.9–10.3)
CO2: 23 mmol/L (ref 22–32)
Chloride: 106 mmol/L (ref 101–111)
Creatinine, Ser: 0.83 mg/dL (ref 0.61–1.24)
Glucose, Bld: 352 mg/dL — ABNORMAL HIGH (ref 65–99)
Potassium: 4.4 mmol/L (ref 3.5–5.1)
SODIUM: 135 mmol/L (ref 135–145)

## 2015-11-29 LAB — TROPONIN I
TROPONIN I: 0.05 ng/mL — AB (ref <0.031)
TROPONIN I: 0.1 ng/mL — AB (ref <0.031)
Troponin I: 0.11 ng/mL — ABNORMAL HIGH (ref <0.031)

## 2015-11-29 LAB — HEMOGLOBIN
HEMOGLOBIN: 7.4 g/dL — AB (ref 13.0–18.0)
Hemoglobin: 6.9 g/dL — ABNORMAL LOW (ref 13.0–18.0)

## 2015-11-29 LAB — PREPARE RBC (CROSSMATCH)

## 2015-11-29 MED ORDER — DEXTROSE 50 % IV SOLN
INTRAVENOUS | Status: AC
  Administered 2015-11-29: 25 mL
  Filled 2015-11-29: qty 50

## 2015-11-29 MED ORDER — NITROGLYCERIN 2 % TD OINT
0.5000 [in_us] | TOPICAL_OINTMENT | Freq: Four times a day (QID) | TRANSDERMAL | Status: DC
  Administered 2015-11-29: 0.5 [in_us] via TOPICAL
  Filled 2015-11-29 (×2): qty 1

## 2015-11-29 MED ORDER — SODIUM CHLORIDE 0.9 % IV SOLN: Freq: Once | INTRAVENOUS | Status: DC

## 2015-11-29 MED ORDER — MORPHINE SULFATE (PF) 2 MG/ML IV SOLN
2.0000 mg | Freq: Once | INTRAVENOUS | Status: AC
  Administered 2015-11-29: 2 mg via INTRAVENOUS
  Filled 2015-11-29: qty 1

## 2015-11-29 MED ORDER — SODIUM CHLORIDE 0.9 % IV SOLN
INTRAVENOUS | Status: DC
  Administered 2015-11-29 (×2): via INTRAVENOUS

## 2015-11-29 MED ORDER — AMLODIPINE BESYLATE 5 MG PO TABS
5.0000 mg | ORAL_TABLET | Freq: Every day | ORAL | Status: DC
  Administered 2015-11-29 – 2015-11-30 (×2): 5 mg via ORAL
  Filled 2015-11-29 (×3): qty 1

## 2015-11-29 MED ORDER — NITROGLYCERIN 0.4 MG SL SUBL
0.4000 mg | SUBLINGUAL_TABLET | SUBLINGUAL | Status: DC | PRN
  Administered 2015-11-29 (×4): 0.4 mg via SUBLINGUAL
  Filled 2015-11-29 (×4): qty 1

## 2015-11-29 MED ORDER — HYDROMORPHONE HCL 1 MG/ML IJ SOLN
2.0000 mg | INTRAMUSCULAR | Status: DC | PRN
  Administered 2015-11-29 – 2015-11-30 (×4): 2 mg via INTRAVENOUS
  Filled 2015-11-29 (×4): qty 2

## 2015-11-29 MED ORDER — GABAPENTIN 600 MG PO TABS
1200.0000 mg | ORAL_TABLET | Freq: Three times a day (TID) | ORAL | Status: DC
  Administered 2015-11-29 – 2015-11-30 (×4): 1200 mg via ORAL
  Filled 2015-11-29 (×6): qty 2

## 2015-11-29 MED ORDER — INSULIN GLARGINE 100 UNIT/ML ~~LOC~~ SOLN
10.0000 [IU] | Freq: Every day | SUBCUTANEOUS | Status: DC
  Administered 2015-11-29: 10 [IU] via SUBCUTANEOUS
  Filled 2015-11-29 (×2): qty 0.1

## 2015-11-29 NOTE — Progress Notes (Signed)
Pt. Very upset about diet regimen, asked NT- RN- charge RN about changing to regular diet.  Pt. Threatened to leave AMA. Discussed w/ pt. Reasons for dietary status r/t GI concerns. Pt. Calmed down but stated he will discuss diet with MD's this AM.

## 2015-11-29 NOTE — Progress Notes (Addendum)
Pt arrived on unit. VSS and blood sugars are stable at this time. Seizure precautions were initiated.  Maurice Davidson

## 2015-11-29 NOTE — Consult Note (Signed)
Patient hgb down to 6.9, supposed to get another unit of blood.  Previous one was 8.4.  I told him that his urine will be tested in morning and if he has again a positive test we may not be able to do the EGD.  With a fall in his hgb I hope we can do this test tomorrow.  His last BP was 150 /107.  WBC down to 11.  Chest clear but some decreased sounds on right base on back.  Conjunctiva and tongue still pink, not quite as much.  Will make him NPO after midnight.

## 2015-11-29 NOTE — Progress Notes (Signed)
Pt.'s troponin were drawn at 0130 this AM, and lab called to report the elevated value @ 0200. But when looking in epic the result is in under 3/14 @ 1412. Called lab spoke with Jocelyn Lamer who stated this was a human error and amended the error on lab's end but in epic the result remains unchanged.   Will alert oncoming RN to this issue

## 2015-11-29 NOTE — Progress Notes (Signed)
RN noticed that hgb is currently 6.9. Doctor Tressia Miners was notified. Doctor Neta Ehlers stated she will put in the order soon. Will continue to monitor pt.   Angus Seller

## 2015-11-29 NOTE — Progress Notes (Signed)
Spoke w/ Dr. Jannifer Franklin, pt. C/o of sudden 9/10 mid sternum pain- sharp but not radiating.  Discussed admission dx and hx as well as events of the day and last dose of morphine. MD ordered stat troponins and nitro PRN. Will continue to monitor pt. Closely.

## 2015-11-29 NOTE — Progress Notes (Signed)
Called and spoke w/ Dr. Jannifer Franklin to relay elevated troponin results. (see EMR for details), MD ordered stat EKG and trended troponins. Pt. States pain is 2/10 and is resting comfortably in bed

## 2015-11-29 NOTE — Progress Notes (Addendum)
0615: pt. C/o of 10/10 pain in the underribcage area of the lower chest-upper abd. Gave pt. Morphine PRN 45 min prior, which pt. Stated helped stomach pain but not this pain. Asked probing questions to discern if this was chest pain, pt. Stated nitro sublingual was not helpful. Pt. Stated he needed to start being "truthful about his pain"   0630: Called and spoke with Dr. Marcille Blanco- discussed admission, hx, events of evening.  MD stated he would look over chart and see what could be done.  [id number]: MD ordered nitro paste Q6h. And one time additional morphine dose.  Will adminster and monitor closely

## 2015-11-29 NOTE — Progress Notes (Signed)
[facility] NAME: Maurice Davidson    MR#:  [id number]  DATE OF BIRTH:  1985/11/15  SUBJECTIVE:  CHIEF COMPLAINT:   Chief Complaint  Patient presents with  . GI Bleeding  . Diarrhea   - Admitted with melena, weakness- hypotensive on admission adn also tachycardic - Hb dropped but stable at 8.0 now - No further melena. Patient is very demanding- asking about dilaudid, eating food and several complaints - Appears very comfortable. EGD not done yesterday due to cocaine in system. - on protonix drip  REVIEW OF SYSTEMS:  Review of Systems  Constitutional: Negative for fever and chills.  HENT: Negative for ear discharge, ear pain and tinnitus.   Eyes: Negative for blurred vision and double vision.  Respiratory: Negative for cough, shortness of breath and wheezing.   Cardiovascular: Positive for chest pain. Negative for palpitations and leg swelling.  Gastrointestinal: Positive for abdominal pain and melena. Negative for nausea, vomiting, diarrhea and constipation.  Genitourinary: Negative for dysuria, urgency and frequency.  Musculoskeletal: Positive for joint pain. Negative for myalgias.  Neurological: Positive for weakness. Negative for dizziness, tremors, sensory change, speech change, focal weakness, seizures and headaches.  Psychiatric/Behavioral: Negative for depression.    DRUG ALLERGIES:   Allergies  Allergen Reactions  . Abilify [Aripiprazole] Rash    VITALS:  Blood pressure 150/107, pulse 100, temperature 98 F (36.7 C), temperature source Oral, resp. rate 18, height 5\' 6"  (1.676 m), weight 56.7 kg (125 lb), SpO2 100 %.  PHYSICAL EXAMINATION:  Physical Exam  GENERAL:  30 y.o.-year-old patient, sitting in the chair, not in any acute disctress EYES: Pupils equal, round, reactive to light and accommodation. No scleral icterus. Extraocular muscles intact.  HEENT: Head atraumatic, normocephalic. Oropharynx and  nasopharynx clear. Poor oral hyegine NECK:  Supple, no jugular venous distention. No thyroid enlargement, no tenderness.  LUNGS: Normal breath sounds bilaterally, no wheezing, rales,rhonchi or crepitation. No use of accessory muscles of respiration.  CARDIOVASCULAR: S1, S2 normal. No murmurs, rubs, or gallops.  ABDOMEN: Soft, nontender,  nondistended. Bowel sounds present. No organomegaly or mass.  EXTREMITIES: No pedal edema, cyanosis, or clubbing.  NEUROLOGIC: Cranial nerves II through XII are intact. Muscle strength 5/5 in all extremities. Sensation intact. Gait not checked.  PSYCHIATRIC: The patient is alert and oriented x 3.  SKIN: No obvious rash, lesion, or ulcer.    LABORATORY PANEL:   CBC  Recent Labs Lab 11/29/15 0551  WBC 11.1*  HGB 8.4*  HCT 26.3*  PLT 354   ------------------------------------------------------------------------------------------------------------------  Chemistries   Recent Labs Lab 11/27/15 1959  11/29/15 0551  NA 135  < > 135  K 2.6*  < > 4.4  CL 83*  < > 106  CO2 38*  < > 23  GLUCOSE 185*  < > 352*  BUN 66*  < > 16  CREATININE 2.63*  < > 0.83  CALCIUM 9.5  < > 8.1*  MG 2.7*  --   --   AST 32  --   --   ALT 21  --   --   ALKPHOS 102  --   --   BILITOT 0.5  --   --   < > = values in this interval not displayed. ------------------------------------------------------------------------------------------------------------------  Cardiac Enzymes  Recent Labs Lab 11/29/15 0551  TROPONINI 0.10*   ------------------------------------------------------------------------------------------------------------------  RADIOLOGY:  Ct Abdomen Pelvis Wo Contrast  11/27/2015  CLINICAL DATA:  Abdominal pain and sepsis. Hypotension and  black stools for 3 days. EXAM: CT ABDOMEN AND PELVIS WITHOUT CONTRAST TECHNIQUE: Multidetector CT imaging of the abdomen and pelvis was performed following the standard protocol without IV contrast. COMPARISON:   11/19/2013 FINDINGS: Lower chest and abdominal wall: Mild circumferential lower esophageal thickening, significantly improved since 2015. No basilar pneumonia Hepatobiliary: No focal liver abnormality.No evidence of biliary obstruction or stone. Pancreas: Unremarkable. Spleen: Small with lobulation and calcification, likely posttraumatic Adrenals/Urinary Tract: Negative adrenals. Asymmetric right renal atrophy. 2 mm lower pole calculus on the left. No hydronephrosis. Unremarkable bladder. Reproductive:No pathologic findings. Stomach/Bowel: No obstruction or inflammatory change. The appendix is not clearly seen but there is no secondary signs of appendicitis. No visible gastric ulceration or inflammation. Vascular/Lymphatic: No acute vascular abnormality. No mass or adenopathy. Peritoneal: No ascites or pneumoperitoneum. Musculoskeletal: Negative. IMPRESSION: No acute finding.  No explanation for sepsis. Electronically Signed   By: Monte Fantasia M.D.   On: 11/27/2015 22:24   Dg Chest Portable 1 View  11/27/2015  CLINICAL DATA:  Hypotension EXAM: PORTABLE CHEST 1 VIEW COMPARISON:  06/03/2015 FINDINGS: Normal heart size. Lungs clear. No pneumothorax. No pleural effusion. IMPRESSION: No active disease. Electronically Signed   By: Marybelle Killings M.D.   On: 11/27/2015 21:55    EKG:   Orders placed or performed during the hospital encounter of 11/27/15  . ED EKG  . ED EKG  . EKG 12-Lead  . EKG 12-Lead  . EKG 12-Lead  . EKG 12-Lead    ASSESSMENT AND PLAN:   30 year old male with past medical history significant for type 1 diabetes mellitus, history of aspiration pneumonia, gastroparesis chronic pain on Suboxone, hypertension and opioid dependence presents to the hospital secondary to anemia and possible GI bleed.  #1 GI bleed-likely upper GI bleed. NSAID induced. Taking celecoxib and also large doses of Motrin at home for his pain. -Continue Protonix drip. Remains nothing by mouth. -GI has been  consulted.  - EGD canceled yesterday due to cocaine in his sytem.   #2 Anemia-acute on chronic anemia secondary to GI bleed. -Baseline hemoglobin seems to be between 10 and 11. Dropped to 8 and stable now. -No active bleeding, melena stopped. -Transfuse as needed if hb <7.5  #3 hypokalemia- replaced. Follow up.  #4 Acute renal failure-prerenal and ATN secondary to hypotension. -Continue IV fluids. Improvement is seen. Avoid nephrotoxins.  #5 sepsis-unknown source at this time. Not sure if sepsis is present. Continue to follow up cultures. WBC is elevated at this time. Continue vancomycin and Zosyn.  #6 chronic pain-continue gabapentin. Suboxone held, as started on dilaudid  #7 diabetes mellitus- sugars elevated, started on lantus and also Continue sliding scale insulin for now.  #8 DVT prophylaxis-Ted's and SCDs due to GI bleed. Hold off heparin products.  #9 Multiple complaints of pain- chest pain- narcotic seeking behaviours, appears very comfortable. DON NOT ALLOW HIM TO WALK OUTSIDE.  VERY DEMANDING.  #10 HTN- started on norvasc and also IV hydralazine prn   Transfer to floor today  All the records are reviewed and case discussed with Care Management/Social Workerr. Management plans discussed with the patient, family and they are in agreement.  CODE STATUS: Full code  TOTAL CRITICAL CARE TIME SPENT IN TAKING CARE OF THIS PATIENT: 37 minutes.   POSSIBLE D/C IN 2 DAYS, DEPENDING ON CLINICAL CONDITION.   Gladstone Lighter M.D on 11/29/2015 at 7:54 AM  Between 7am to 6pm - Pager - [phone]  After 6pm go to [email or web address] - password [email or web address] [facility]  Office  [phone]  CC: Primary care physician; No primary care provider on file.

## 2015-11-29 NOTE — Progress Notes (Signed)
Pt.'s Hgb: 7.4 down from 8.4 at last draw (see EMR for details). Spoke w/ Dr. Jannifer Franklin, waiting to see results from morning CBC before making a decision on blood administration

## 2015-11-29 NOTE — Progress Notes (Signed)
Notified Dr. Tressia Miners this am that patient's blood sugars are trending high- last one was 406-she stated she would start lantus and modified sliding scale- also that patient can receive 15 units of insulin now.  Also updated her about patient apparent complaint of chest pain- patient received nitro sublingual and EKG- but then told the night RN that pain was truly on right rib- patient now started on dilaudid.

## 2015-11-30 ENCOUNTER — Encounter
Admission: EM | Discharge status: Against Medical Advice | Payer: Self-pay | Source: Home / Self Care | Attending: Internal Medicine

## 2015-11-30 LAB — BASIC METABOLIC PANEL
ANION GAP: 4 — AB (ref 5–15)
BUN: 8 mg/dL (ref 6–20)
CHLORIDE: 105 mmol/L (ref 101–111)
CO2: 24 mmol/L (ref 22–32)
Calcium: 7.8 mg/dL — ABNORMAL LOW (ref 8.9–10.3)
Creatinine, Ser: 0.75 mg/dL (ref 0.61–1.24)
GFR calc non Af Amer: >60 mL/min (ref >60)
Glucose, Bld: 172 mg/dL — ABNORMAL HIGH (ref 65–99)
POTASSIUM: 3.6 mmol/L (ref 3.5–5.1)
SODIUM: 133 mmol/L — AB (ref 135–145)

## 2015-11-30 LAB — URINE DRUG SCREEN, QUALITATIVE (ARMC ONLY)
AMPHETAMINES, UR SCREEN: NOT DETECTED
BARBITURATES, UR SCREEN: NOT DETECTED
BENZODIAZEPINE, UR SCRN: POSITIVE — AB
Cannabinoid 50 Ng, Ur ~~LOC~~: POSITIVE — AB
Cocaine Metabolite,Ur ~~LOC~~: POSITIVE — AB
MDMA (Ecstasy)Ur Screen: NOT DETECTED
METHADONE SCREEN, URINE: NOT DETECTED
OPIATE, UR SCREEN: POSITIVE — AB
Phencyclidine (PCP) Ur S: NOT DETECTED
TRICYCLIC, UR SCREEN: NOT DETECTED

## 2015-11-30 LAB — URINE CULTURE
Culture: NO GROWTH
SPECIAL REQUESTS: NORMAL

## 2015-11-30 LAB — HEMOGLOBIN
HEMOGLOBIN: 10.8 g/dL — AB (ref 13.0–18.0)
Hemoglobin: 8.9 g/dL — ABNORMAL LOW (ref 13.0–18.0)

## 2015-11-30 LAB — CBC
HEMATOCRIT: 26.8 % — AB (ref 40.0–52.0)
HEMOGLOBIN: 8.9 g/dL — AB (ref 13.0–18.0)
MCH: 28.7 pg (ref 26.0–34.0)
MCHC: 33.2 g/dL (ref 32.0–36.0)
MCV: 86.5 fL (ref 80.0–100.0)
Platelets: 294 10*3/uL (ref 150–440)
RBC: 3.09 MIL/uL — AB (ref 4.40–5.90)
RDW: 17.6 % — ABNORMAL HIGH (ref 11.5–14.5)
WBC: 10.5 10*3/uL (ref 3.8–10.6)

## 2015-11-30 LAB — GLUCOSE, CAPILLARY: GLUCOSE-CAPILLARY: 140 mg/dL — AB (ref 65–99)

## 2015-11-30 SURGERY — EGD (ESOPHAGOGASTRODUODENOSCOPY)
Anesthesia: General

## 2015-11-30 MED ORDER — HYDROMORPHONE HCL 1 MG/ML IJ SOLN
0.5000 mg | INTRAMUSCULAR | Status: DC | PRN
  Administered 2015-11-30: 0.5 mg via INTRAVENOUS
  Filled 2015-11-30: qty 1

## 2015-11-30 MED ORDER — SODIUM CHLORIDE 0.9 % IV SOLN: INTRAVENOUS | Status: DC

## 2015-11-30 MED ORDER — HYDROMORPHONE HCL 1 MG/ML IJ SOLN: 1.0000 mg | INTRAMUSCULAR | Status: DC | PRN

## 2015-11-30 NOTE — Progress Notes (Signed)
Pt. BS at 43, given 240 mls of orange juice, MD notified. 25 mls of Dextrose ordered and admin. Pt BS up to 125.

## 2015-11-30 NOTE — Discharge Summary (Signed)
[place] [facility] NAME: Maurice Davidson    MR#:  [id number]  DATE OF BIRTH:  12-03-1985  DATE OF ADMISSION:  11/27/2015 ADMITTING PHYSICIAN: Lance Coon, MD  DATE OF DISCHARGE: 11/30/2015 10:45 AM  LEFT AMA  PRIMARY CARE PHYSICIAN: No primary care provider on file.    ADMISSION DIAGNOSIS:  Other specified hypotension [I95.89] Gastrointestinal hemorrhage with melena [K92.1] Sepsis, due to unspecified organism ([place]) [A41.9]  DISCHARGE DIAGNOSIS:  Principal Problem:   Severe sepsis ([place]) Active Problems:   Diabetes mellitus type 1 (HCC)   Seizures (HCC)   Anxiety   HTN (hypertension)   GI bleed   AKI (acute kidney injury) ([place])   Hypokalemia   SECONDARY DIAGNOSIS:   Past Medical History  Diagnosis Date  . Diabetes mellitus without complication ([place])   . Seizures ([place])   . Renal disorder   . Anemia   . MRSA (methicillin resistant staph aureus) culture positive   . Sepsis ([place])   . Aspiration pneumonia ([place])   . H/O: GI bleed   . Gastroparesis   . Encephalopathy   . Chronic pain     take Suboxone  . Drug-seeking behavior   . Opioid dependence ([place])   . HTN (hypertension)   . Anxiety     HOSPITAL COURSE:   30 year old male with past medical history significant for type 1 diabetes mellitus, history of aspiration pneumonia, gastroparesis chronic pain on Suboxone, hypertension and opioid dependence presents to the hospital secondary to anemia and possible GI bleed.  #1 GI bleed-likely upper GI bleed. NSAID induced. Taking celecoxib and also large doses of Motrin at home for his pain. NSAIDS were discontinued. -was on Protonix drip. -GI has been consulted.  -And was on clear liquid diet. EGD has been canceled twice due to cocaine in his system. -Patient was demanding regular food. He had a drop in hemoglobin yesterday requiring 2 units of transfusion. He was explained that he cannot be on a regular diet and might  need the EGD sooner. Patient has been off the floor several times. History of drug abuse. Not compliant with the hospital rules. When diet was not advanced, he decided to leave [place]. His father was in the room when the conversation happened.  #2 Anemia-acute on chronic anemia secondary to GI bleed. -Baseline hemoglobin seems to be between 10 and 11. Dropped on admission Head melena. Received 2 units of packed RBC transfusion prior to leaving AMA. His last hemoglobin was 10.5.  #3 hypokalemia- replaced..  #4 Acute renal failure-prerenal and ATN secondary to hypotension. -Improved with fluids in the hospital  #5 sepsis-ruled out. Discontinued antibiotics  #6 chronic pain-continue gabapentin. Narcotic seeking behavior. Has high pain tolerance. On Suboxone at home.  #7 diabetes mellitus- on lantus.  #10 HTN- on norvasc as outpatient   Again as mentioned above, patient has been very demanding, noncompliant to the treatment here in the hospital. Leaving the floor several times, narcotic seeking behavior and also history of drug abuse. Patient left AMA today.  DISCHARGE CONDITIONS:   Guarded  CONSULTS OBTAINED:  Treatment Team:  Manya Silvas, MD  DRUG ALLERGIES:   Allergies  Allergen Reactions  . Abilify [Aripiprazole] Rash    DISCHARGE MEDICATIONS:   Discharge Medication List as of 11/30/2015 10:45 AM    CONTINUE these medications which have NOT CHANGED   Details  ALPRAZolam (XANAX) 1 MG tablet Take 1 mg by mouth 3 (three) times daily as  needed for anxiety., Until Discontinued, Historical Med    amLODipine (NORVASC) 5 MG tablet Take 1 tablet (5 mg total) by mouth daily., Starting 06/04/2015, Until Discontinued, Normal    aspirin EC 81 MG tablet Take 81 mg by mouth daily., Until Discontinued, Historical Med    budesonide-formoterol (SYMBICORT) 160-4.5 MCG/ACT inhaler Inhale 2 puffs into the lungs 2 (two) times daily., Until Discontinued, Historical Med     Buprenorphine HCl-Naloxone HCl (SUBOXONE) 8-2 MG FILM Place 0.25 Film under the tongue daily., Until Discontinued, Historical Med    busPIRone (BUSPAR) 15 MG tablet Take 15 mg by mouth 3 (three) times daily., Until Discontinued, Historical Med    celecoxib (CELEBREX) 100 MG capsule Take 100 mg by mouth 2 (two) times daily., Until Discontinued, Historical Med    diclofenac sodium (VOLTAREN) 1 % GEL Apply 2 g topically 4 (four) times daily as needed (for pain). , Until Discontinued, Historical Med    gabapentin (NEURONTIN) 300 MG capsule Take 1,200 mg by mouth 3 (three) times daily. , Until Discontinued, Historical Med    hydrOXYzine (VISTARIL) 25 MG capsule Take 25 mg by mouth 3 (three) times daily as needed for anxiety., Until Discontinued, Historical Med    ibuprofen (ADVIL,MOTRIN) 200 MG tablet Take 600-800 mg by mouth every 6 (six) hours as needed for headache or mild pain., Until Discontinued, Historical Med    insulin aspart (NOVOLOG) 100 UNIT/ML injection Inject 5 Units into the skin 3 (three) times daily with meals. Pt is to increase by one unit per every 12 carbs consumed.  Max of 20 units daily., Until Discontinued, Historical Med    insulin glargine (LANTUS) 100 UNIT/ML injection Inject 12 Units into the skin at bedtime. , Until Discontinued, Historical Med    metoCLOPramide (REGLAN) 10 MG tablet Take 10 mg by mouth 3 (three) times daily before meals., Until Discontinued, Historical Med    Multiple Vitamins-Minerals (MULTIVITAMIN WITH MINERALS) tablet Take 1 tablet by mouth daily., Until Discontinued, Historical Med    pantoprazole (PROTONIX) 20 MG tablet Take 20 mg by mouth 2 (two) times daily., Until Discontinued, Historical Med    sertraline (ZOLOFT) 50 MG tablet Take 150 mg by mouth daily., Until Discontinued, Historical Med    traZODone (DESYREL) 50 MG tablet Take 150 mg by mouth at bedtime., Until Discontinued, Historical Med         DISCHARGE INSTRUCTIONS:   1.  Patient left AMA Advised to follow up with GI and also PCP at the earliest possible appointment  If you experience worsening of your admission symptoms, develop shortness of breath, life threatening emergency, suicidal or homicidal thoughts you must seek medical attention immediately by calling 911 or calling your MD immediately  if symptoms less severe.  You Must read complete instructions/literature along with all the possible adverse reactions/side effects for all the Medicines you take and that have been prescribed to you. Take any new Medicines after you have completely understood and accept all the possible adverse reactions/side effects.   Please note  You were cared for by a hospitalist during your hospital stay. If you have any questions about your discharge medications or the care you received while you were in the hospital after you are discharged, you can call the unit and asked to speak with the hospitalist on call if the hospitalist that took care of you is not available. Once you are discharged, your primary care physician will handle any further medical issues. Please note that NO REFILLS for any discharge  medications will be authorized once you are discharged, as it is imperative that you return to your primary care physician (or establish a relationship with a primary care physician if you do not have one) for your aftercare needs so that they can reassess your need for medications and monitor your lab values.    Today   CHIEF COMPLAINT:   Chief Complaint  Patient presents with  . GI Bleeding  . Diarrhea    VITAL SIGNS:  Blood pressure 158/106, pulse 91, temperature 98.8 F (37.1 C), temperature source Oral, resp. rate 18, height 5\' 6"  (1.676 m), weight 56.7 kg (125 lb), SpO2 99 %.  I/O:   Intake/Output Summary (Last 24 hours) at 11/30/15 1630 Last data filed at 11/30/15 0831  Gross per 24 hour  Intake 3038.58 ml  Output    350 ml  Net 2688.58 ml    PHYSICAL  EXAMINATION:   Physical Exam  GENERAL: 30 y.o.-year-old patient, sitting in the chair, not in any acute disctress EYES: Pupils equal, round, reactive to light and accommodation. No scleral icterus. Extraocular muscles intact.  HEENT: Head atraumatic, normocephalic. Oropharynx and nasopharynx clear. Poor oral hyegine NECK: Supple, no jugular venous distention. No thyroid enlargement, no tenderness.  LUNGS: Normal breath sounds bilaterally, no wheezing, rales,rhonchi or crepitation. No use of accessory muscles of respiration.  CARDIOVASCULAR: S1, S2 normal. No murmurs, rubs, or gallops.  ABDOMEN: Soft, nontender, nondistended. Bowel sounds present. No organomegaly or mass.  EXTREMITIES: No pedal edema, cyanosis, or clubbing.  NEUROLOGIC: Cranial nerves II through XII are intact. Muscle strength 5/5 in all extremities. Sensation intact. Gait not checked.  PSYCHIATRIC: The patient is alert and oriented x 3.  SKIN: No obvious rash, lesion, or ulcer.    DATA REVIEW:   CBC  Recent Labs Lab 11/30/15 0225 11/30/15 0715  WBC 10.5  --   HGB 8.9*  8.9* 10.8*  HCT 26.8*  --   PLT 294  --     Chemistries   Recent Labs Lab 11/27/15 1959  11/30/15 0225  NA 135  < > 133*  K 2.6*  < > 3.6  CL 83*  < > 105  CO2 38*  < > 24  GLUCOSE 185*  < > 172*  BUN 66*  < > 8  CREATININE 2.63*  < > 0.75  CALCIUM 9.5  < > 7.8*  MG 2.7*  --   --   AST 32  --   --   ALT 21  --   --   ALKPHOS 102  --   --   BILITOT 0.5  --   --   < > = values in this interval not displayed.  Cardiac Enzymes  Recent Labs Lab 11/29/15 1933  TROPONINI 0.11*    Microbiology Results  Results for orders placed or performed during the hospital encounter of 11/27/15  Culture, blood (routine x 2)     Status: None (Preliminary result)   Collection Time: 11/27/15  9:31 PM  Result Value Ref Range Status   Specimen Description BLOOD BLOOD RIGHT FOREARM  Final   Special Requests BOTTLES DRAWN AEROBIC AND  ANAEROBIC 10ML  Final   Culture NO GROWTH 3 DAYS  Final   Report Status PENDING  Incomplete  Urine culture     Status: None   Collection Time: 11/27/15  9:32 PM  Result Value Ref Range Status   Specimen Description URINE, RANDOM  Final   Special Requests Normal  Final   Culture  NO GROWTH 2 DAYS  Final   Report Status 11/30/2015 FINAL  Final  Culture, blood (routine x 2)     Status: None (Preliminary result)   Collection Time: 11/27/15  9:32 PM  Result Value Ref Range Status   Specimen Description BLOOD BLOOD LEFT FOREARM  Final   Special Requests BOTTLES DRAWN AEROBIC AND ANAEROBIC 10ML  Final   Culture NO GROWTH 3 DAYS  Final   Report Status PENDING  Incomplete  MRSA PCR Screening     Status: Abnormal   Collection Time: 11/28/15  1:00 AM  Result Value Ref Range Status   MRSA by PCR POSITIVE (A) NEGATIVE Final    Comment:        The GeneXpert MRSA Assay (FDA approved for NASAL specimens only), is one component of a comprehensive MRSA colonization surveillance program. It is not intended to diagnose MRSA infection nor to guide or monitor treatment for MRSA infections. CRITICAL RESULT CALLED TO, READ BACK BY AND VERIFIED WITH: ZACHERY ALLEN ON 11/28/15 AT 0213 [facility]     RADIOLOGY:  No results found.  EKG:   Orders placed or performed during the hospital encounter of 11/27/15  . ED EKG  . ED EKG  . EKG 12-Lead  . EKG 12-Lead  . EKG 12-Lead  . EKG 12-Lead      Management plans discussed with the patient, family and they are in agreement.  CODE STATUS:  Code Status History    Date Active Date Inactive Code Status Order ID Comments User Context   11/28/2015 12:59 AM 11/30/2015  1:46 PM Full Code [id number]  Lance Coon, MD Inpatient   06/02/2015 10:02 AM 06/04/2015  2:55 PM Full Code [id number]  Gladstone Lighter, MD Inpatient      TOTAL TIME TAKING CARE OF THIS PATIENT: 38 minutes.    Gladstone Lighter M.D on 11/30/2015 at 4:30 PM  Between 7am to 6pm - Pager -  [phone]  After 6pm go to [email or web address] - password [email or web address] [facility]  [facility]  Office  [phone]  CC: Primary care physician; No primary care provider on file.

## 2015-11-30 NOTE — Progress Notes (Addendum)
Since the start of my shift this AM, pt has been adamant to have a regular diet instead of clear liquids. Pt was told that per the GI and primary Dr, he could only have clears until the primary Dr discussed it with the GI dr in full. Pt said, "if I don't get a regular diet, I am leaving." Pt told RN he had been eating graham crackers all morning and had left the floor even though primary RN advised him not to because it was against his orders. RN called Dr and Dr advised RN to let the pt know if he didn't want to follow orders, he could sign out AMA. Pt was told if he didn't want to follow through with the orders, the DR advised him to sign out AMA. Pt stated, "I will make an appointment for tomorrow with my primary dr at [facility]." Pt and father both in the room and understand the risks of leaving AMA. Pt's Hgb is stable at 10.8 currently. IV was removed and pt left with his father after he signed the [facility] paper. AMA paper was then put in his chart.

## 2015-12-01 LAB — TYPE AND SCREEN
ABO/RH(D): O POS
ANTIBODY SCREEN: NEGATIVE
UNIT DIVISION: 0
Unit division: 0

## 2015-12-02 LAB — CULTURE, BLOOD (ROUTINE X 2)
Culture: NO GROWTH
Culture: NO GROWTH

## 2016-01-15 ENCOUNTER — Encounter: Payer: Self-pay | Admitting: *Deleted

## 2016-01-15 ENCOUNTER — Emergency Department: Payer: Medicaid Other

## 2016-01-15 ENCOUNTER — Emergency Department
Admission: EM | Admit: 2016-01-15 | Discharge: 2016-01-16 | Disposition: A | Discharge status: Home / Self Care | Payer: Medicaid Other | Attending: Emergency Medicine | Admitting: Emergency Medicine

## 2016-01-15 DIAGNOSIS — E119 Type 2 diabetes mellitus without complications: Secondary | ICD-10-CM | POA: Insufficient documentation

## 2016-01-15 DIAGNOSIS — Z79899 Other long term (current) drug therapy: Secondary | ICD-10-CM | POA: Insufficient documentation

## 2016-01-15 DIAGNOSIS — R55 Syncope and collapse: Secondary | ICD-10-CM | POA: Diagnosis present

## 2016-01-15 DIAGNOSIS — Z794 Long term (current) use of insulin: Secondary | ICD-10-CM | POA: Diagnosis not present

## 2016-01-15 DIAGNOSIS — F17210 Nicotine dependence, cigarettes, uncomplicated: Secondary | ICD-10-CM | POA: Diagnosis not present

## 2016-01-15 DIAGNOSIS — F1110 Opioid abuse, uncomplicated: Secondary | ICD-10-CM | POA: Diagnosis not present

## 2016-01-15 DIAGNOSIS — I10 Essential (primary) hypertension: Secondary | ICD-10-CM | POA: Insufficient documentation

## 2016-01-15 DIAGNOSIS — Z7982 Long term (current) use of aspirin: Secondary | ICD-10-CM | POA: Diagnosis not present

## 2016-01-15 LAB — CBC
HCT: 35.8 % — ABNORMAL LOW (ref 40.0–52.0)
Hemoglobin: 11.9 g/dL — ABNORMAL LOW (ref 13.0–18.0)
MCH: 29.4 pg (ref 26.0–34.0)
MCHC: 33.4 g/dL (ref 32.0–36.0)
MCV: 88 fL (ref 80.0–100.0)
PLATELETS: 331 10*3/uL (ref 150–440)
RBC: 4.07 MIL/uL — ABNORMAL LOW (ref 4.40–5.90)
RDW: 15.2 % — AB (ref 11.5–14.5)
WBC: 15.8 10*3/uL — ABNORMAL HIGH (ref 3.8–10.6)

## 2016-01-15 LAB — BASIC METABOLIC PANEL
Anion gap: 11 (ref 5–15)
BUN: 29 mg/dL — AB (ref 6–20)
CALCIUM: 9.2 mg/dL (ref 8.9–10.3)
CHLORIDE: 99 mmol/L — AB (ref 101–111)
CO2: 29 mmol/L (ref 22–32)
CREATININE: 1.58 mg/dL — AB (ref 0.61–1.24)
GFR calc non Af Amer: 57 mL/min — ABNORMAL LOW (ref >60)
GLUCOSE: 204 mg/dL — AB (ref 65–99)
Potassium: 3.8 mmol/L (ref 3.5–5.1)
Sodium: 139 mmol/L (ref 135–145)

## 2016-01-15 MED ORDER — SODIUM CHLORIDE 0.9 % IV BOLUS (SEPSIS)
1000.0000 mL | Freq: Once | INTRAVENOUS | Status: AC
  Administered 2016-01-15: 1000 mL via INTRAVENOUS

## 2016-01-15 NOTE — ED Notes (Addendum)
Patient transported to CT 

## 2016-01-15 NOTE — ED Notes (Addendum)
Pt to ED from home after family witnessed syncopal episode with possible seizure like activity. Pt with hx of pervious seizure due to withdrawals and percocet abuse. Pt states only took one today and used cocaine yesterday, denies use today. Pt tachy upon arrival, shaking uncontrollably. Pt AAOx4, vitals stable, NAD noted. Pt denies any SI or HI at this time. Pt diabetic, BGL 204 per EMS.

## 2016-01-15 NOTE — ED Provider Notes (Signed)
[facility] Emergency Department Provider Note   ____________________________________________  Time seen: Approximately 11:16 PM  I have reviewed the triage vital signs and the nursing notes.   HISTORY  Chief Complaint Loss of Consciousness and Drug Overdose    HPI Maurice Davidson is a 30 y.o. male who presents to the ED from home with a chief complaint of possible seizure. Patient has a history of seizures, taken off of his antiepileptic medications over 1 year ago when he last had a seizure. Patient states he had previous seizure secondary to withdrawals and Percocet abuse. He is trying to take himself off of Suboxone. Today he took 3/4 of a 30 mg OxyContin approximately 3 PM. Approximately 6 PM he had his family were in the kitchen, he was getting ice cream when he had a syncopal episode with seizure-like activity. Patient did not bite his tongue or experience urinary incontinence. There was no postictal state. Patient denies SI/HI/AH/VH. Admits to cocaine use. Denies recent fever, chills, headache, neck pain, chest pain, shortness of breath, abdominal pain, nausea, vomiting, diarrhea.Nothing makes his symptoms better or worse.   Past Medical History  Diagnosis Date  . Diabetes mellitus without complication ([place])   . Seizures ([place])   . Renal disorder   . Anemia   . MRSA (methicillin resistant staph aureus) culture positive   . Sepsis ([place])   . Aspiration pneumonia ([place])   . H/O: GI bleed   . Gastroparesis   . Encephalopathy   . Chronic pain     take Suboxone  . Drug-seeking behavior   . Opioid dependence ([place])   . HTN (hypertension)   . Anxiety     Patient Active Problem List   Diagnosis Date Noted  . HTN (hypertension) 11/27/2015  . GI bleed 11/27/2015  . AKI (acute kidney injury) ([place]) 11/27/2015  . Hypokalemia 11/27/2015  . Protein-calorie malnutrition, severe ([place]) 06/03/2015  . Severe sepsis ([place]) 06/02/2015  . Stomach pain 10/20/2013   . Thrombocytosis ([place]) 10/10/2013  . Anxiety 10/10/2013  . Encephalopathy 10/07/2013  . Diabetes mellitus type 1 ([place]) 10/07/2013  . MRSA bacteremia 10/07/2013  . Seizures ([place]) 10/07/2013  . Aspiration pneumonia ([place]) 10/07/2013  . Acute renal failure ([place]) 10/07/2013  . Anemia of chronic disease 10/07/2013    Past Surgical History  Procedure Laterality Date  . Orthopedic surgery      Current Outpatient Rx  Name  Route  Sig  Dispense  Refill  . amLODipine (NORVASC) 5 MG tablet   Oral   Take 1 tablet (5 mg total) by mouth daily.   30 tablet   0   . aspirin EC 81 MG tablet   Oral   Take 81 mg by mouth daily.         . budesonide-formoterol (SYMBICORT) 160-4.5 MCG/ACT inhaler   Inhalation   Inhale 2 puffs into the lungs 2 (two) times daily.         . busPIRone (BUSPAR) 15 MG tablet   Oral   Take 15 mg by mouth 3 (three) times daily.         . celecoxib (CELEBREX) 100 MG capsule   Oral   Take 100 mg by mouth 2 (two) times daily.         Marland Kitchen gabapentin (NEURONTIN) 300 MG capsule   Oral   Take 1,200 mg by mouth 3 (three) times daily.          Marland Kitchen glucagon (GLUCAGON EMERGENCY) 1 MG injection  Intravenous   Inject 1 mg into the vein once as needed.         . hydrOXYzine (VISTARIL) 25 MG capsule   Oral   Take 25 mg by mouth 3 (three) times daily as needed for anxiety.         Marland Kitchen ibuprofen (ADVIL,MOTRIN) 200 MG tablet   Oral   Take 600-800 mg by mouth every 6 (six) hours as needed for headache or mild pain.         Marland Kitchen insulin aspart (NOVOLOG) 100 UNIT/ML injection   Subcutaneous   Inject 5 Units into the skin 3 (three) times daily with meals. Pt is to increase by one unit per every 12 carbs consumed.  Max of 20 units daily.         . insulin glargine (LANTUS) 100 UNIT/ML injection   Subcutaneous   Inject 12 Units into the skin at bedtime.          . metoCLOPramide (REGLAN) 10 MG tablet   Oral   Take 10 mg by mouth 3 (three) times daily before  meals.         . Multiple Vitamins-Minerals (MULTIVITAMIN WITH MINERALS) tablet   Oral   Take 1 tablet by mouth daily.         . pantoprazole (PROTONIX) 20 MG tablet   Oral   Take 20 mg by mouth 2 (two) times daily.         . sertraline (ZOLOFT) 50 MG tablet   Oral   Take 150 mg by mouth daily.         . traZODone (DESYREL) 50 MG tablet   Oral   Take 150 mg by mouth at bedtime.           Allergies Benzodiazepines and Abilify  Family History  Problem Relation Age of Onset  . Clotting disorder Sister     Von Willebrand's disease plus polycythemia vera  . Hypertension Mother     Social History Social History  Substance Use Topics  . Smoking status: Current Some Day Smoker -- 1.00 packs/day    Types: Cigarettes  . Smokeless tobacco: None  . Alcohol Use: No    Review of Systems  Constitutional: No fever/chills. Eyes: No visual changes. ENT: No sore throat. Cardiovascular: Denies chest pain. Respiratory: Denies shortness of breath. Gastrointestinal: No abdominal pain.  No nausea, no vomiting.  No diarrhea.  No constipation. Genitourinary: Negative for dysuria. Musculoskeletal: Negative for back pain. Skin: Negative for rash. Neurological: Probable for seizure. Negative for headaches, focal weakness or numbness. Psychiatric:Positive for substance abuse.  10-point ROS otherwise negative.  ____________________________________________   PHYSICAL EXAM:  VITAL SIGNS: ED Triage Vitals  Enc Vitals Group     BP 01/15/16 2244 135/97 mmHg     Pulse Rate 01/15/16 2244 112     Resp 01/15/16 2244 20     Temp 01/15/16 2244 98.6 F (37 C)     Temp Source 01/15/16 2244 Oral     SpO2 01/15/16 2244 98 %     Weight 01/15/16 2244 130 lb (58.968 kg)     Height 01/15/16 2244 5\' 5"  (1.651 m)     Head Cir --      Peak Flow --      Pain Score --      Pain Loc --      Pain Edu? --      Excl. in [place]? --     Constitutional: Alert and oriented. Well appearing  and  in no acute distress. Eyes: Conjunctivae are normal. PERRL. EOMI. Head: Atraumatic. Nose: No congestion/rhinnorhea. Mouth/Throat: Mucous membranes are moist.  Oropharynx non-erythematous.  No tongue laceration or abrasion. Neck: No stridor.   Cardiovascular: Normal rate, regular rhythm. Grossly normal heart sounds.  Good peripheral circulation. Respiratory: Normal respiratory effort.  No retractions. Lungs CTAB. Gastrointestinal: Soft and nontender. No distention. No abdominal bruits. No CVA tenderness. Musculoskeletal: No lower extremity tenderness nor edema.  No joint effusions. Neurologic:  Normal speech and language. No gross focal neurologic deficits are appreciated.  Skin:  Skin is warm, dry and intact. No rash noted. Psychiatric: Mood and affect are normal. Speech and behavior are normal.  ____________________________________________   LABS (all labs ordered are listed, but only abnormal results are displayed)  Labs Reviewed  BASIC METABOLIC PANEL - Abnormal; Notable for the following:    Chloride 99 (*)    Glucose, Bld 204 (*)    BUN 29 (*)    Creatinine, Ser 1.58 (*)    GFR calc non Af Amer 57 (*)    All other components within normal limits  CBC - Abnormal; Notable for the following:    WBC 15.8 (*)    RBC 4.07 (*)    Hemoglobin 11.9 (*)    HCT 35.8 (*)    RDW 15.2 (*)    All other components within normal limits  ACETAMINOPHEN LEVEL - Abnormal; Notable for the following:    Acetaminophen (Tylenol), Serum <10 (*)    All other components within normal limits  HEPATIC FUNCTION PANEL - Abnormal; Notable for the following:    ALT 11 (*)    All other components within normal limits  ETHANOL  SALICYLATE LEVEL  LIPASE, BLOOD  URINALYSIS COMPLETEWITH MICROSCOPIC ([facility] ONLY)  URINE DRUG SCREEN, QUALITATIVE ([facility] ONLY)   ____________________________________________  EKG  ED ECG REPORT I, SUNG,JADE J, the attending physician, personally viewed and interpreted this  ECG.   Date: 01/16/2016  EKG Time: 2240  Rate: 114  Rhythm: sinus tachycardia  Axis: Normal  Intervals:none  ST&T Change: Nonspecific  ____________________________________________  RADIOLOGY  CT head without contrast interpreted per Dr. Pascal Lux: Negative noncontrast head CT.  Portable chest x-ray (viewed by me, interpreted per Dr. Gerilyn Nestle): No active disease. ____________________________________________   PROCEDURES  Procedure(s) performed: None  Critical Care performed: No  ____________________________________________   INITIAL IMPRESSION / ASSESSMENT AND PLAN / ED COURSE  Pertinent labs & imaging results that were available during my care of the patient were reviewed by me and considered in my medical decision making (see chart for details).  30 year old male with diabetes, seizure secondary to substances who is trying to wean himself off Suboxone. Probable accidental overdose of opioid medication tonight. Currently patient is feeling better, not shaking or restless but desires to speak with TTS for resources to help with opioid abuse.  ----------------------------------------- 3:24 AM on 01/16/2016 -----------------------------------------  Patient was given a list of outpatient detox resources by TTS. Strict return precautions given. Patient verbalizes understanding and agrees with plan of care. ____________________________________________   FINAL CLINICAL IMPRESSION(S) / ED DIAGNOSES  Final diagnoses:  Opiate abuse, continuous      NEW MEDICATIONS STARTED DURING THIS VISIT:  New Prescriptions   No medications on file     Note:  This document was prepared using Dragon voice recognition software and may include unintentional dictation errors.    Paulette Blanch, MD 01/16/16 [phone]

## 2016-01-16 LAB — ACETAMINOPHEN LEVEL

## 2016-01-16 LAB — HEPATIC FUNCTION PANEL
ALBUMIN: 4.5 g/dL (ref 3.5–5.0)
ALT: 11 U/L — ABNORMAL LOW (ref 17–63)
AST: 17 U/L (ref 15–41)
Alkaline Phosphatase: 75 U/L (ref 38–126)
BILIRUBIN INDIRECT: 0.7 mg/dL (ref 0.3–0.9)
Bilirubin, Direct: 0.1 mg/dL (ref 0.1–0.5)
TOTAL PROTEIN: 7.9 g/dL (ref 6.5–8.1)
Total Bilirubin: 0.8 mg/dL (ref 0.3–1.2)

## 2016-01-16 LAB — ETHANOL

## 2016-01-16 LAB — SALICYLATE LEVEL

## 2016-01-16 LAB — LIPASE, BLOOD: LIPASE: 12 U/L (ref 11–51)

## 2016-01-16 NOTE — ED Notes (Signed)
Pt given meal tray, sitting up in bed eating and tolerating well, NAD noted.

## 2016-01-16 NOTE — ED Notes (Signed)
Pt given sugar free ice cream at this time pt tolerating well, NAD noted, will continue to monitor.

## 2016-01-16 NOTE — Discharge Instructions (Signed)
Return to the ER for worsening symptoms, persistent vomiting, difficulty breathing or other concerns.  Opioid Use Disorder Opioid use disorder is a mental disorder. It is the continued nonmedical use of opioids in spite of risks to health and well-being. Misused opioids include the street drug heroin. They also include pain medicines such as morphine, hydrocodone, oxycodone, and fentanyl. Opioids are very addictive. People who misuse opioids get an exaggerated feeling of well-being. Opioid use disorder often disrupts activities at home, work, or school. It may cause mental or physical problems.  A family history of opioid use disorder puts you at higher risk of it. People with opioid use disorder often misuse other drugs or have mental illness such as depression, posttraumatic stress disorder, or antisocial personality disorder. They also are at risk of suicide and death from overdose. SIGNS AND SYMPTOMS  Signs and symptoms of opioid use disorder include:  Use of opioids in larger amounts or over a longer period than intended.  Unsuccessful attempts to cut down or control opioid use.  A lot of time spent obtaining, using, or recovering from the effects of opioids.  A strong desire or urge to use opioids (craving).  Continued use of opioids in spite of major problems at work, school, or home because of use.  Continued use of opioids in spite of relationship problems because of use.  Giving up or cutting down on important life activities because of opioid use.  Use of opioids over and over in situations when it is physically hazardous, such as driving a car.  Continued use of opioids in spite of a physical problem that is likely related to use. Physical problems can include:  Severe constipation.  Poor nutrition.  Infertility.  Tuberculosis.  Aspiration pneumonia.  Infections such as human immunodeficiency virus (HIV) and hepatitis (from injecting opioids).  Continued use of  opioids in spite of a mental problem that is likely related to use. Mental problems can include:  Depression.  Anxiety.  Hallucinations.  Sleep problems.  Loss of sexual function.  Need to use more and more opioids to get the same effect, or lessened effect over time with use of the same amount (tolerance).  Having withdrawal symptoms when opioid use is stopped, or using opioids to reduce or avoid withdrawal symptoms. Withdrawal symptoms include:  Depressed, anxious, or irritable mood.  Nausea, vomiting, diarrhea, or intestinal cramping.  Muscle aches or spasms.  Excessive tearing or runny nose.  Dilated pupils, sweating, or hairs standing on end.  Yawning.  Fever, raised blood pressure, or fast pulse.  Restlessness or trouble sleeping. This does not apply to people taking opioids for medical reasons only. DIAGNOSIS Opioid use disorder is diagnosed by your health care provider. You may be asked questions about your opioid use and and how it affects your life. A physical exam may be done. A drug screen may be ordered. You may be referred to a mental health professional. The diagnosis of opioid use disorder requires at least two symptoms within 12 months. The type of opioid use disorder you have depends on the number of signs and symptoms you have. The type may be:  Mild. Two or three signs and symptoms.   Moderate. Four or five signs and symptoms.   Severe. Six or more signs and symptoms. TREATMENT  Treatment is usually provided by mental health professionals with training in substance use disorders.The following options are available:  Detoxification.This is the first step in treatment for withdrawal. It is medically supervised withdrawal  with the use of medicines. These medicines lessen withdrawal symptoms. They also raise the chance of becoming opioid free.  Counseling, also known as talk therapy. Talk therapy addresses the reasons you use opioids. It also addresses  ways to keep you from using again (relapse). The goals of talk therapy are to avoid relapse by:  Identifying and avoiding triggers for use.  Finding healthy ways to cope with stress.  Learning how to handle cravings.  Support groups. Support groups provide emotional support, advice, and guidance.  A medicine that blocks opioid receptors in your brain. This medicine can reduce opioid cravings that lead to relapse. This medicine also blocks the desired opioid effect when relapse occurs.  Opioids that are taken by mouth in place of the misused opioid (opioid maintenance treatment). These medicines satisfy cravings but are safer than commonly misused opioids. This often is the best option for people who continue to relapse with other treatments. HOME CARE INSTRUCTIONS   Take medicines only as directed by your health care provider.  Check with your health care provider before starting new medicines.  Keep all follow-up visits as directed by your health care provider. SEEK MEDICAL CARE IF:  You are not able to take your medicines as directed.  Your symptoms get worse. SEEK IMMEDIATE MEDICAL CARE IF:  You have serious thoughts about hurting yourself or others.  You may have taken an overdose of opioids. [place] on Drug Abuse: motorcyclefax.com  Substance Abuse and Mental Health Services Administration: ktimeonline.com   This information is not intended to replace advice given to you by your health care provider. Make sure you discuss any questions you have with your health care provider.   Document Released: 06/30/2007 Document Revised: 09/23/2014 Document Reviewed: 09/15/2013 Elsevier Interactive Patient Education [employer].

## 2016-08-20 LAB — HM DIABETES EYE EXAM

## 2017-06-18 ENCOUNTER — Encounter: Payer: Self-pay | Admitting: Physician Assistant

## 2017-06-18 ENCOUNTER — Ambulatory Visit (INDEPENDENT_AMBULATORY_CARE_PROVIDER_SITE_OTHER): Payer: Medicaid Other | Admitting: Physician Assistant

## 2017-06-18 VITALS — BP 104/64 | HR 86 | Temp 98.4°F | Resp 16 | Wt 96.0 lb

## 2017-06-18 DIAGNOSIS — E1042 Type 1 diabetes mellitus with diabetic polyneuropathy: Secondary | ICD-10-CM

## 2017-06-18 DIAGNOSIS — F339 Major depressive disorder, recurrent, unspecified: Secondary | ICD-10-CM

## 2017-06-18 DIAGNOSIS — Z23 Encounter for immunization: Secondary | ICD-10-CM

## 2017-06-18 DIAGNOSIS — G4700 Insomnia, unspecified: Secondary | ICD-10-CM

## 2017-06-18 DIAGNOSIS — R7989 Other specified abnormal findings of blood chemistry: Secondary | ICD-10-CM

## 2017-06-18 DIAGNOSIS — R809 Proteinuria, unspecified: Secondary | ICD-10-CM

## 2017-06-18 DIAGNOSIS — Z0000 Encounter for general adult medical examination without abnormal findings: Secondary | ICD-10-CM | POA: Diagnosis not present

## 2017-06-18 DIAGNOSIS — N183 Chronic kidney disease, stage 3 unspecified: Secondary | ICD-10-CM

## 2017-06-18 DIAGNOSIS — E1029 Type 1 diabetes mellitus with other diabetic kidney complication: Secondary | ICD-10-CM | POA: Diagnosis not present

## 2017-06-18 DIAGNOSIS — F419 Anxiety disorder, unspecified: Secondary | ICD-10-CM

## 2017-06-18 DIAGNOSIS — E1022 Type 1 diabetes mellitus with diabetic chronic kidney disease: Secondary | ICD-10-CM

## 2017-06-18 DIAGNOSIS — Z13220 Encounter for screening for lipoid disorders: Secondary | ICD-10-CM | POA: Diagnosis not present

## 2017-06-18 DIAGNOSIS — Z720 Tobacco use: Secondary | ICD-10-CM | POA: Diagnosis not present

## 2017-06-18 DIAGNOSIS — R1310 Dysphagia, unspecified: Secondary | ICD-10-CM

## 2017-06-18 DIAGNOSIS — Z1329 Encounter for screening for other suspected endocrine disorder: Secondary | ICD-10-CM | POA: Diagnosis not present

## 2017-06-18 MED ORDER — TRAZODONE HCL 50 MG PO TABS: 150.0000 mg | ORAL_TABLET | Freq: Every day | ORAL | 2 refills | Status: DC

## 2017-06-18 MED ORDER — GABAPENTIN 600 MG PO TABS: 1200.0000 mg | ORAL_TABLET | Freq: Three times a day (TID) | ORAL | 2 refills | Status: DC

## 2017-06-18 MED ORDER — SERTRALINE HCL 100 MG PO TABS: 200.0000 mg | ORAL_TABLET | Freq: Every day | ORAL | 2 refills | Status: DC

## 2017-06-18 NOTE — Patient Instructions (Signed)
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
## 2017-06-19 LAB — POCT UA - MICROALBUMIN: Microalbumin Ur, POC: 100 mg/L

## 2017-06-19 LAB — CBC WITH DIFFERENTIAL/PLATELET
Basophils Absolute: 103 cells/uL (ref 0–200)
Basophils Relative: 0.9 %
Eosinophils Absolute: 171 cells/uL (ref 15–500)
Eosinophils Relative: 1.5 %
HCT: 35.9 % — ABNORMAL LOW (ref 38.5–50.0)
Hemoglobin: 11.8 g/dL — ABNORMAL LOW (ref 13.2–17.1)
Lymphs Abs: 4423 cells/uL — ABNORMAL HIGH (ref 850–3900)
MCH: 30.2 pg (ref 27.0–33.0)
MCHC: 32.9 g/dL (ref 32.0–36.0)
MCV: 91.8 fL (ref 80.0–100.0)
MPV: 11.2 fL (ref 7.5–12.5)
Monocytes Relative: 8 %
Neutro Abs: 5791 cells/uL (ref 1500–7800)
Neutrophils Relative %: 50.8 %
Platelets: 367 10*3/uL (ref 140–400)
RBC: 3.91 10*6/uL — ABNORMAL LOW (ref 4.20–5.80)
RDW: 14.1 % (ref 11.0–15.0)
Total Lymphocyte: 38.8 %
WBC mixed population: 912 cells/uL (ref 200–950)
WBC: 11.4 10*3/uL — ABNORMAL HIGH (ref 3.8–10.8)

## 2017-06-19 LAB — COMPREHENSIVE METABOLIC PANEL
AG Ratio: 1.4 (calc) (ref 1.0–2.5)
ALT: 8 U/L — ABNORMAL LOW (ref 9–46)
AST: 15 U/L (ref 10–40)
Albumin: 4.6 g/dL (ref 3.6–5.1)
Alkaline phosphatase (APISO): 86 U/L (ref 40–115)
BUN/Creatinine Ratio: 20 (calc) (ref 6–22)
BUN: 56 mg/dL — ABNORMAL HIGH (ref 7–25)
CO2: 43 mmol/L — ABNORMAL HIGH (ref 20–32)
Calcium: 10.2 mg/dL (ref 8.6–10.3)
Chloride: 90 mmol/L — ABNORMAL LOW (ref 98–110)
Creat: 2.8 mg/dL — ABNORMAL HIGH (ref 0.60–1.35)
Globulin: 3.2 g/dL (calc) (ref 1.9–3.7)
Glucose, Bld: 51 mg/dL — ABNORMAL LOW (ref 65–139)
Potassium: 5.1 mmol/L (ref 3.5–5.3)
Sodium: 140 mmol/L (ref 135–146)
Total Bilirubin: 0.6 mg/dL (ref 0.2–1.2)
Total Protein: 7.8 g/dL (ref 6.1–8.1)

## 2017-06-19 LAB — LIPID PANEL
Cholesterol: 210 mg/dL — ABNORMAL HIGH (ref <200)
HDL: 42 mg/dL (ref >40)
LDL Cholesterol (Calc): 126 mg/dL (calc) — ABNORMAL HIGH
Non-HDL Cholesterol (Calc): 168 mg/dL (calc) — ABNORMAL HIGH (ref <130)
Total CHOL/HDL Ratio: 5 (calc) — ABNORMAL HIGH (ref <5.0)
Triglycerides: 293 mg/dL — ABNORMAL HIGH (ref <150)

## 2017-06-19 LAB — TSH: TSH: 0.33 mIU/L — ABNORMAL LOW (ref 0.40–4.50)

## 2017-06-19 LAB — POCT GLYCOSYLATED HEMOGLOBIN (HGB A1C): Hemoglobin A1C: 9.3

## 2017-06-25 ENCOUNTER — Telehealth: Payer: Self-pay | Admitting: Physician Assistant

## 2017-06-25 ENCOUNTER — Other Ambulatory Visit: Payer: Self-pay | Admitting: Physician Assistant

## 2017-06-25 DIAGNOSIS — F419 Anxiety disorder, unspecified: Secondary | ICD-10-CM

## 2017-06-25 DIAGNOSIS — F339 Major depressive disorder, recurrent, unspecified: Secondary | ICD-10-CM | POA: Insufficient documentation

## 2017-06-25 NOTE — Telephone Encounter (Signed)
Can we please call this patient and let him know that I have reviewed his pre-op form and I would like him to follow up with his specialists including nephrology and endocrinology before signing this to better control his diabetes and address his kidney function. He will also need to come back for a separate visit so we can get an EKG after all of that.

## 2017-06-25 NOTE — Progress Notes (Signed)
Put in referral for [facility] psychiatry as [facility] does not have provider to see patient.

## 2017-06-30 ENCOUNTER — Encounter: Payer: Self-pay | Admitting: Physician Assistant

## 2017-06-30 NOTE — Telephone Encounter (Signed)
LMTCB 06/30/2017  Thanks,   -Mickel Baas

## 2017-07-11 ENCOUNTER — Other Ambulatory Visit: Payer: Self-pay | Admitting: Physician Assistant

## 2017-07-11 DIAGNOSIS — F419 Anxiety disorder, unspecified: Secondary | ICD-10-CM

## 2017-07-11 DIAGNOSIS — F339 Major depressive disorder, recurrent, unspecified: Secondary | ICD-10-CM

## 2017-10-21 ENCOUNTER — Ambulatory Visit (INDEPENDENT_AMBULATORY_CARE_PROVIDER_SITE_OTHER): Payer: Medicaid Other | Admitting: Physician Assistant

## 2017-10-21 ENCOUNTER — Encounter: Payer: Self-pay | Admitting: Physician Assistant

## 2017-10-21 VITALS — BP 98/60 | HR 80 | Temp 97.9°F | Resp 16 | Ht 66.0 in | Wt 98.0 lb

## 2017-10-21 DIAGNOSIS — M25561 Pain in right knee: Secondary | ICD-10-CM | POA: Diagnosis not present

## 2017-10-21 DIAGNOSIS — G629 Polyneuropathy, unspecified: Secondary | ICD-10-CM

## 2017-10-21 DIAGNOSIS — M25562 Pain in left knee: Secondary | ICD-10-CM

## 2017-10-21 DIAGNOSIS — E1029 Type 1 diabetes mellitus with other diabetic kidney complication: Secondary | ICD-10-CM | POA: Diagnosis not present

## 2017-10-21 DIAGNOSIS — F319 Bipolar disorder, unspecified: Secondary | ICD-10-CM

## 2017-10-21 DIAGNOSIS — G4700 Insomnia, unspecified: Secondary | ICD-10-CM | POA: Diagnosis not present

## 2017-10-21 DIAGNOSIS — G8929 Other chronic pain: Secondary | ICD-10-CM | POA: Diagnosis not present

## 2017-10-21 DIAGNOSIS — R7989 Other specified abnormal findings of blood chemistry: Secondary | ICD-10-CM | POA: Diagnosis not present

## 2017-10-21 DIAGNOSIS — Z23 Encounter for immunization: Secondary | ICD-10-CM | POA: Diagnosis not present

## 2017-10-21 DIAGNOSIS — Z720 Tobacco use: Secondary | ICD-10-CM | POA: Diagnosis not present

## 2017-10-21 DIAGNOSIS — R809 Proteinuria, unspecified: Secondary | ICD-10-CM | POA: Diagnosis not present

## 2017-10-21 MED ORDER — TRAZODONE HCL 50 MG PO TABS: 150.0000 mg | ORAL_TABLET | Freq: Every day | ORAL | 2 refills | Status: DC

## 2017-10-21 MED ORDER — GLUCAGON (RDNA) 1 MG IJ KIT: 1.0000 mg | PACK | Freq: Once | INTRAMUSCULAR | 1 refills | Status: DC | PRN

## 2017-10-21 MED ORDER — NICOTINE 7 MG/24HR TD PT24: 7.0000 mg | MEDICATED_PATCH | Freq: Every day | TRANSDERMAL | 0 refills | Status: AC

## 2017-10-21 MED ORDER — NICOTINE 14 MG/24HR TD PT24: 14.0000 mg | MEDICATED_PATCH | Freq: Every day | TRANSDERMAL | 0 refills | Status: AC

## 2017-10-21 MED ORDER — BUDESONIDE-FORMOTEROL FUMARATE 160-4.5 MCG/ACT IN AERO: 2.0000 | INHALATION_SPRAY | Freq: Two times a day (BID) | RESPIRATORY_TRACT | 1 refills | Status: DC

## 2017-10-21 NOTE — Progress Notes (Signed)
Patient: Maurice Davidson Male    DOB: [birth year]   32 y.o.   MRN: [id number] Visit Date: 10/21/2017  Today's Provider: Trinna Post, PA-C   Chief Complaint  Patient presents with  . Diabetes  . 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   He wants to stop smoking, is smoking 1.5 packs per day. Her requests Chantix.   Status of Referrals from last Visit: 1. Davidson: missed 10/16/17 appointment with Maurice Davidson Maurice Davidson and did not reschedule 2. Nephrology: apparently did not know about 06/30/2017 appt with Maurice Davidson 3. Ophthalmology: says Maurice Davidson will not see him until he changes Medicaid provider on his card 4.Gastroenterology: Says he went to [facility] and they told him he needs his esophagus dilated 5. Psychiatry: Says he went to a place called [facility] in [place], [place] where he was diagnosed with Bipolar depression. He says he was recommended two medications, one a medication for depression and one a blood pressure medication. He says he was not started on this because when he tried to return, the office was shut down and he says it is facing some "government issues." He says he was prescribed a 5 day supply of Ambien from the psychiatrist and wants this refilled.  DM Remains uncontrolled, A1C today is 9.1%. He says he still has insulin. Microalbumin from last visit was 100. Not current on Davidson exam. Current on pneumonia vaccination. Not on ACEi.  Bipolar Depression  Says he went to psychiatrist and was diagnosed with bipolar depression I.     Diabetes  He presents for his follow-up diabetic visit. He has type 1 diabetes mellitus. Pertinent negatives for hypoglycemia include no dizziness or headaches. (Pt reports only having an occasional "Low". ) Associated symptoms include blurred vision  (History of Neuropathy). Pertinent negatives for diabetes include no chest pain, no fatigue, no foot paresthesias, no foot ulcerations, no polydipsia, no polyphagia, no polyuria, no visual change, no weakness and no weight loss. Diabetic complications include retinopathy. He is following a generally healthy diet. He participates in exercise three times a week (Pt walks at least three times a week.). His overall blood glucose range is 140-180 mg/dl. He does not see a podiatrist.Davidson exam is not current.  Nicotine Dependence  Presents for initial visit. Symptoms are negative for fatigue. Preferred tobacco types include cigarettes. Preferred cigarette types include filtered. Preferred strength is regular. Preferred cigarettes are menthol. Preferred brands include [place]. His urge triggers include company of smokers. He smokes 1 pack (Pt reports smoking about 1 1/2 packs a day) of cigarettes per day. He started smoking when he was 25-21 years old. Past treatments include nothing. Vaughan is ready to quit. Skylan has tried to quit 1 time.       Allergies  Allergen Reactions  . Benzodiazepines     Avoid prescribing  . Abilify [Aripiprazole] Rash     Current Outpatient Medications:  .  acetaminophen (TYLENOL) 500 MG tablet, Take 500 mg by mouth every 6 (six) hours as needed., Disp: , Rfl:  .  glucagon (GLUCAGON EMERGENCY) 1 MG injection, Inject 1 mg into the vein once as needed., Disp: , Rfl:  .  insulin aspart (NOVOLOG) 100 UNIT/ML injection, Inject 5 Units into the skin 3 (three) times daily with meals. Pt is  to increase by one unit per every 12 carbs consumed.  Max of 20 units daily., Disp: , Rfl:  .  insulin glargine (LANTUS) 100 UNIT/ML injection, Inject 10 Units into the skin at bedtime. , Disp: , Rfl:  .  Multiple Vitamins-Minerals (MULTIVITAMIN WITH MINERALS) tablet, Take 1 tablet by mouth daily., Disp: , Rfl:  .  pantoprazole (PROTONIX) 20 MG tablet, Take 20 mg by mouth 2 (two) times daily.,  Disp: , Rfl:  .  zolpidem (AMBIEN) 10 MG tablet, TAKE 1 TABLET BY MOUTH AT BEDTIME AS NEEDED FOR SLEEP (NOT TO EXCEED 5 PER WEEK), Disp: , Rfl: 0 .  budesonide-formoterol (SYMBICORT) 160-4.5 MCG/ACT inhaler, Inhale 2 puffs into the lungs 2 (two) times daily., Disp: , Rfl:  .  gabapentin (NEURONTIN) 600 MG tablet, Take 2 tablets (1,200 mg total) by mouth 3 (three) times daily., Disp: 180 tablet, Rfl: 2 .  ibuprofen (ADVIL,MOTRIN) 200 MG tablet, Take 600-800 mg by mouth every 6 (six) hours as needed for headache or mild pain., Disp: , Rfl:  .  sertraline (ZOLOFT) 100 MG tablet, Take 2 tablets (200 mg total) by mouth daily., Disp: 60 tablet, Rfl: 2 .  traZODone (DESYREL) 50 MG tablet, Take 3 tablets (150 mg total) by mouth at bedtime., Disp: 90 tablet, Rfl: 2  Review of Systems  Constitutional: Negative.  Negative for fatigue and weight loss.  Eyes: Positive for blurred vision (History of Neuropathy).  Respiratory: Positive for cough and shortness of breath. Negative for apnea, choking, chest tightness, wheezing and stridor.   Cardiovascular: Negative for chest pain, palpitations and leg swelling.  Gastrointestinal: Positive for abdominal pain (Some cramping) and nausea. Negative for abdominal distention, anal bleeding, blood in stool, constipation, diarrhea, rectal pain and vomiting.  Endocrine: Negative for cold intolerance, heat intolerance, polydipsia, polyphagia and polyuria.  Neurological: Positive for light-headedness. Negative for dizziness, weakness and headaches.    Social History   Tobacco Use  . Smoking status: Current Some Day Smoker    Packs/day: 1.00    Types: Cigarettes  . Smokeless tobacco: Never Used  Substance Use Topics  . Alcohol use: No    Alcohol/week: 0.0 oz   Objective:   BP 98/60 (BP Location: Right Arm, Patient Position: Sitting, Cuff Size: Normal)   Pulse 80   Temp 97.9 F (36.6 C) (Oral)   Resp 16   Ht 5\' 6"  (1.676 m)   Wt 98 lb (44.5 kg)   BMI 15.82  kg/m  Vitals:   10/21/17 1111  BP: 98/60  Pulse: 80  Resp: 16  Temp: 97.9 F (36.6 C)  TempSrc: Oral  Weight: 98 lb (44.5 kg)  Height: 5\' 6"  (1.676 m)     Physical Exam  Constitutional: He is oriented to person, place, and time. He appears well-developed and well-nourished.  Cardiovascular: Normal rate and regular rhythm.  Pulmonary/Chest: Effort normal and breath sounds normal.  Musculoskeletal: He exhibits deformity.  Nodularity of right knee evident. Extremely thin lower extremities.  Neurological: He is alert and oriented to person, place, and time.  Skin: Skin is warm and dry.  Psychiatric: His mood appears anxious. His affect is labile. He exhibits a depressed mood.  Intermittently crying and angry in exam room.    Diabetic Foot Exam - Simple   Simple Foot Form Diabetic Foot exam was performed with the following findings:  Yes 10/22/2017 12:43 PM  Visual Inspection No deformities, no ulcerations, no other skin breakdown bilaterally:  Yes Sensation Testing Intact to touch and  monofilament testing bilaterally:  Yes Pulse Check Posterior Tibialis and Dorsalis pulse intact bilaterally:  Yes Comments         Assessment & Plan:     1. 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 I run into many of the same issues as I did during our last visit. Clearly in need of ACEi due to microalbuminuria, though I am uncomfortable initiating this due to his inability to recognize low blood sugars and also his borderline low blood blood pressure without any BP medications. He also requires statin therapy and insulin adjustment. I have stressed importance of following up with Davidson to better control his DM.  - POCT HgB A1C - Urine Microalbumin w/creat. ratio - Comprehensive Metabolic Panel (CMET) - glucagon (GLUCAGON EMERGENCY) 1 MG injection; Inject 1 mg into the vein once as needed.  Dispense: 1 each; Refill: 1  2. Insomnia, unspecified  type  Patient says his psychiatrist gave him a 5 day supply of Ambien and that he wants this filled because he cannot sleep. I have done controlled substance search on this patient and he received Ambien 10 mg #22 from Dr. Gennie Alma who is an obesity specialist in [place], [place]. Unclear to me why patient is seeing this provider, but he is clearly not a psychiatrist. Additionally, this same provider is prescribing patient Suboxone, which patient did not mention to me. I am not going to refill Ambien, I will refill trazodone at current dose. Suspect insomnia related to untreated psychiatric conditions.  - traZODone (DESYREL) 50 MG tablet; Take 3 tablets (150 mg total) by mouth at bedtime.  Dispense: 90 tablet; Refill: 2  3. Tobacco abuse  With patient's last creatinine at 2.8, do not feel comfortable writing for Chantix 2/2 renal function. Not entirely confident patient would comply with monitoring and follow up, same goes for Wellbutrin. Nicotine patch as below, 6 weeks at 14mg  dose and then 2 weeks at 7mg  dose.  - budesonide-formoterol (SYMBICORT) 160-4.5 MCG/ACT inhaler; Inhale 2 puffs into the lungs 2 (two) times daily.  Dispense: 1 Inhaler; Refill: 1 - nicotine (NICODERM CQ - DOSED IN MG/24 HOURS) 14 mg/24hr patch; Place 1 patch (14 mg total) onto the skin daily.  Dispense: 42 patch; Refill: 0 - nicotine (NICODERM CQ - DOSED IN MG/24 HR) 7 mg/24hr patch; Place 1 patch (7 mg total) onto the skin daily for 14 days.  Dispense: 14 patch; Refill: 0  4. Elevated TSH  TSH low at last draw.   - TSH+T4F+T3Free  5. Neuropathy   6. Chronic pain of both knees  Filled out handicap form.  7. Flu vaccine need  - Flu Vaccine QUAD 6+ mos PF IM (Fluarix Quad PF)  8. Bipolar I disorder ([place])  Do not see in [place] where psychiatrist has given Ambien. He says he went to [facility] which closed down. Will need another referral.  - Ambulatory referral to Psychiatry  Return in about 6 months  (around 04/20/2018), or insomnia.  The entirety of the information documented in the History of Present Illness, Review of Systems and Physical Exam were personally obtained by me. Portions of this information were initially documented by Ashley Royalty, CMA and reviewed by me for thoroughness and accuracy.         Trinna Post, PA-C  [facility]

## 2017-10-21 NOTE — Patient Instructions (Signed)

## 2017-10-22 LAB — TSH+T4F+T3FREE
Free T4: 0.92 ng/dL (ref 0.82–1.77)
T3, Free: 1.7 pg/mL — ABNORMAL LOW (ref 2.0–4.4)
TSH: 0.927 u[IU]/mL (ref 0.450–4.500)

## 2017-10-22 LAB — MICROALBUMIN / CREATININE URINE RATIO
Creatinine, Urine: 194.9 mg/dL
Microalb/Creat Ratio: 18.3 mg/g creat (ref 0.0–30.0)
Microalbumin, Urine: 35.7 ug/mL

## 2017-10-22 LAB — COMPREHENSIVE METABOLIC PANEL
ALT: 11 IU/L (ref 0–44)
AST: 16 IU/L (ref 0–40)
Albumin/Globulin Ratio: 1.7 (ref 1.2–2.2)
Albumin: 4.2 g/dL (ref 3.5–5.5)
Alkaline Phosphatase: 68 IU/L (ref 39–117)
BUN/Creatinine Ratio: 17 (ref 9–20)
BUN: 28 mg/dL — ABNORMAL HIGH (ref 6–20)
Bilirubin Total: 0.2 mg/dL (ref 0.0–1.2)
CO2: 23 mmol/L (ref 20–29)
Calcium: 9.3 mg/dL (ref 8.7–10.2)
Chloride: 101 mmol/L (ref 96–106)
Creatinine, Ser: 1.66 mg/dL — ABNORMAL HIGH (ref 0.76–1.27)
GFR calc Af Amer: 62 mL/min/{1.73_m2} (ref >59)
GFR calc non Af Amer: 54 mL/min/{1.73_m2} — ABNORMAL LOW (ref >59)
Globulin, Total: 2.5 g/dL (ref 1.5–4.5)
Glucose: 150 mg/dL — ABNORMAL HIGH (ref 65–99)
Potassium: 5.5 mmol/L — ABNORMAL HIGH (ref 3.5–5.2)
Sodium: 139 mmol/L (ref 134–144)
Total Protein: 6.7 g/dL (ref 6.0–8.5)

## 2017-10-23 LAB — POCT GLYCOSYLATED HEMOGLOBIN (HGB A1C): Hemoglobin A1C: 9.1

## 2017-10-24 ENCOUNTER — Telehealth: Payer: Self-pay | Admitting: Physician Assistant

## 2017-10-24 NOTE — Telephone Encounter (Signed)
Please advise 

## 2017-10-24 NOTE — Telephone Encounter (Signed)
This referral has been placed last visit. In fact, patient had visit scheduled at 06/23/2017 with Tammi Klippel at [facility] that he told me he attended.  Please provide him with clinic # for him to reschedule.

## 2017-10-24 NOTE — Telephone Encounter (Signed)
Pt requesting referral to GI states he is having gastric issues like what he was in the hospital for before.  Pt states he saw Dr. Vira Agar at [facility] about 3 or 4 years ago and would like to be referred there.

## 2017-10-27 NOTE — Telephone Encounter (Signed)
LMTCB 10/27/2017   Thanks,   -Mickel Baas

## 2017-10-28 NOTE — Telephone Encounter (Signed)
Patient is returning your call and is requesting a call back.

## 2017-11-03 NOTE — Telephone Encounter (Signed)
Pt advised.   Thanks,   -Annalucia Laino  

## 2017-12-22 ENCOUNTER — Emergency Department: Payer: Medicaid Other

## 2017-12-22 ENCOUNTER — Inpatient Hospital Stay
Admission: EM | Admit: 2017-12-22 | Discharge: 2017-12-31 | DRG: 871 | Disposition: A | Discharge status: Home / Self Care | Payer: Medicaid Other | Attending: Internal Medicine | Admitting: Internal Medicine

## 2017-12-22 ENCOUNTER — Inpatient Hospital Stay: Payer: Medicaid Other

## 2017-12-22 DIAGNOSIS — G4700 Insomnia, unspecified: Secondary | ICD-10-CM | POA: Diagnosis present

## 2017-12-22 DIAGNOSIS — K3184 Gastroparesis: Secondary | ICD-10-CM | POA: Diagnosis present

## 2017-12-22 DIAGNOSIS — F1120 Opioid dependence, uncomplicated: Secondary | ICD-10-CM | POA: Diagnosis present

## 2017-12-22 DIAGNOSIS — N179 Acute kidney failure, unspecified: Secondary | ICD-10-CM | POA: Diagnosis present

## 2017-12-22 DIAGNOSIS — I129 Hypertensive chronic kidney disease with stage 1 through stage 4 chronic kidney disease, or unspecified chronic kidney disease: Secondary | ICD-10-CM | POA: Diagnosis present

## 2017-12-22 DIAGNOSIS — J1008 Influenza due to other identified influenza virus with other specified pneumonia: Secondary | ICD-10-CM | POA: Diagnosis present

## 2017-12-22 DIAGNOSIS — D631 Anemia in chronic kidney disease: Secondary | ICD-10-CM | POA: Diagnosis present

## 2017-12-22 DIAGNOSIS — E1110 Type 2 diabetes mellitus with ketoacidosis without coma: Secondary | ICD-10-CM | POA: Diagnosis present

## 2017-12-22 DIAGNOSIS — E10649 Type 1 diabetes mellitus with hypoglycemia without coma: Secondary | ICD-10-CM | POA: Diagnosis not present

## 2017-12-22 DIAGNOSIS — E43 Unspecified severe protein-calorie malnutrition: Secondary | ICD-10-CM | POA: Diagnosis present

## 2017-12-22 DIAGNOSIS — E1022 Type 1 diabetes mellitus with diabetic chronic kidney disease: Secondary | ICD-10-CM | POA: Diagnosis present

## 2017-12-22 DIAGNOSIS — A491 Streptococcal infection, unspecified site: Secondary | ICD-10-CM | POA: Diagnosis not present

## 2017-12-22 DIAGNOSIS — Z888 Allergy status to other drugs, medicaments and biological substances status: Secondary | ICD-10-CM

## 2017-12-22 DIAGNOSIS — J154 Pneumonia due to other streptococci: Secondary | ICD-10-CM | POA: Diagnosis not present

## 2017-12-22 DIAGNOSIS — Z681 Body mass index (BMI) 19 or less, adult: Secondary | ICD-10-CM

## 2017-12-22 DIAGNOSIS — E860 Dehydration: Secondary | ICD-10-CM

## 2017-12-22 DIAGNOSIS — R945 Abnormal results of liver function studies: Secondary | ICD-10-CM | POA: Diagnosis not present

## 2017-12-22 DIAGNOSIS — J690 Pneumonitis due to inhalation of food and vomit: Secondary | ICD-10-CM | POA: Diagnosis present

## 2017-12-22 DIAGNOSIS — E1010 Type 1 diabetes mellitus with ketoacidosis without coma: Secondary | ICD-10-CM | POA: Diagnosis not present

## 2017-12-22 DIAGNOSIS — R4182 Altered mental status, unspecified: Secondary | ICD-10-CM | POA: Diagnosis not present

## 2017-12-22 DIAGNOSIS — A403 Sepsis due to Streptococcus pneumoniae: Principal | ICD-10-CM | POA: Diagnosis present

## 2017-12-22 DIAGNOSIS — N183 Chronic kidney disease, stage 3 unspecified: Secondary | ICD-10-CM | POA: Diagnosis present

## 2017-12-22 DIAGNOSIS — J9601 Acute respiratory failure with hypoxia: Secondary | ICD-10-CM | POA: Diagnosis present

## 2017-12-22 DIAGNOSIS — R401 Stupor: Secondary | ICD-10-CM | POA: Diagnosis not present

## 2017-12-22 DIAGNOSIS — J13 Pneumonia due to Streptococcus pneumoniae: Secondary | ICD-10-CM | POA: Diagnosis not present

## 2017-12-22 DIAGNOSIS — Z765 Malingerer [conscious simulation]: Secondary | ICD-10-CM

## 2017-12-22 DIAGNOSIS — E440 Moderate protein-calorie malnutrition: Secondary | ICD-10-CM | POA: Diagnosis not present

## 2017-12-22 DIAGNOSIS — R000 Tachycardia, unspecified: Secondary | ICD-10-CM | POA: Diagnosis present

## 2017-12-22 DIAGNOSIS — R569 Unspecified convulsions: Secondary | ICD-10-CM

## 2017-12-22 DIAGNOSIS — E162 Hypoglycemia, unspecified: Secondary | ICD-10-CM | POA: Diagnosis not present

## 2017-12-22 DIAGNOSIS — E0811 Diabetes mellitus due to underlying condition with ketoacidosis with coma: Secondary | ICD-10-CM

## 2017-12-22 DIAGNOSIS — E1065 Type 1 diabetes mellitus with hyperglycemia: Secondary | ICD-10-CM | POA: Diagnosis not present

## 2017-12-22 DIAGNOSIS — G894 Chronic pain syndrome: Secondary | ICD-10-CM | POA: Diagnosis present

## 2017-12-22 DIAGNOSIS — F4323 Adjustment disorder with mixed anxiety and depressed mood: Secondary | ICD-10-CM | POA: Diagnosis not present

## 2017-12-22 DIAGNOSIS — A419 Sepsis, unspecified organism: Secondary | ICD-10-CM | POA: Diagnosis not present

## 2017-12-22 DIAGNOSIS — E1011 Type 1 diabetes mellitus with ketoacidosis with coma: Secondary | ICD-10-CM | POA: Diagnosis not present

## 2017-12-22 DIAGNOSIS — Z8249 Family history of ischemic heart disease and other diseases of the circulatory system: Secondary | ICD-10-CM

## 2017-12-22 DIAGNOSIS — G9340 Encephalopathy, unspecified: Secondary | ICD-10-CM | POA: Diagnosis present

## 2017-12-22 DIAGNOSIS — J189 Pneumonia, unspecified organism: Secondary | ICD-10-CM

## 2017-12-22 DIAGNOSIS — F17210 Nicotine dependence, cigarettes, uncomplicated: Secondary | ICD-10-CM | POA: Diagnosis present

## 2017-12-22 DIAGNOSIS — R6520 Severe sepsis without septic shock: Secondary | ICD-10-CM | POA: Diagnosis not present

## 2017-12-22 DIAGNOSIS — R64 Cachexia: Secondary | ICD-10-CM | POA: Diagnosis present

## 2017-12-22 DIAGNOSIS — B955 Unspecified streptococcus as the cause of diseases classified elsewhere: Secondary | ICD-10-CM | POA: Diagnosis not present

## 2017-12-22 DIAGNOSIS — R7881 Bacteremia: Secondary | ICD-10-CM | POA: Diagnosis not present

## 2017-12-22 DIAGNOSIS — E1043 Type 1 diabetes mellitus with diabetic autonomic (poly)neuropathy: Secondary | ICD-10-CM | POA: Diagnosis present

## 2017-12-22 DIAGNOSIS — B370 Candidal stomatitis: Secondary | ICD-10-CM | POA: Diagnosis not present

## 2017-12-22 DIAGNOSIS — G40909 Epilepsy, unspecified, not intractable, without status epilepticus: Secondary | ICD-10-CM | POA: Diagnosis present

## 2017-12-22 DIAGNOSIS — Z0189 Encounter for other specified special examinations: Secondary | ICD-10-CM

## 2017-12-22 DIAGNOSIS — J9690 Respiratory failure, unspecified, unspecified whether with hypoxia or hypercapnia: Secondary | ICD-10-CM

## 2017-12-22 DIAGNOSIS — Z8614 Personal history of Methicillin resistant Staphylococcus aureus infection: Secondary | ICD-10-CM

## 2017-12-22 DIAGNOSIS — J101 Influenza due to other identified influenza virus with other respiratory manifestations: Secondary | ICD-10-CM | POA: Diagnosis not present

## 2017-12-22 DIAGNOSIS — Z832 Family history of diseases of the blood and blood-forming organs and certain disorders involving the immune mechanism: Secondary | ICD-10-CM

## 2017-12-22 DIAGNOSIS — I340 Nonrheumatic mitral (valve) insufficiency: Secondary | ICD-10-CM | POA: Diagnosis not present

## 2017-12-22 DIAGNOSIS — Z79899 Other long term (current) drug therapy: Secondary | ICD-10-CM

## 2017-12-22 DIAGNOSIS — Z794 Long term (current) use of insulin: Secondary | ICD-10-CM

## 2017-12-22 DIAGNOSIS — Z7951 Long term (current) use of inhaled steroids: Secondary | ICD-10-CM

## 2017-12-22 LAB — COMPREHENSIVE METABOLIC PANEL
ALK PHOS: 136 U/L — AB (ref 38–126)
ALT: 17 U/L (ref 17–63)
ANION GAP: 25 — AB (ref 5–15)
AST: 42 U/L — AB (ref 15–41)
Albumin: 2.8 g/dL — ABNORMAL LOW (ref 3.5–5.0)
BILIRUBIN TOTAL: 1.8 mg/dL — AB (ref 0.3–1.2)
BUN: 93 mg/dL — AB (ref 6–20)
CALCIUM: 6.8 mg/dL — AB (ref 8.9–10.3)
CO2: 9 mmol/L — ABNORMAL LOW (ref 22–32)
Chloride: 95 mmol/L — ABNORMAL LOW (ref 101–111)
Creatinine, Ser: 3.12 mg/dL — ABNORMAL HIGH (ref 0.61–1.24)
GFR calc Af Amer: 29 mL/min — ABNORMAL LOW (ref >60)
GFR, EST NON AFRICAN AMERICAN: 25 mL/min — AB (ref >60)
Glucose, Bld: 937 mg/dL (ref 65–99)
POTASSIUM: 4.3 mmol/L (ref 3.5–5.1)
Sodium: 129 mmol/L — ABNORMAL LOW (ref 135–145)
TOTAL PROTEIN: 6.5 g/dL (ref 6.5–8.1)

## 2017-12-22 LAB — CBC WITH DIFFERENTIAL/PLATELET
BLASTS: 0 %
Band Neutrophils: 48 %
Basophils Absolute: 0 10*3/uL (ref 0–0.1)
Basophils Relative: 0 %
Eosinophils Absolute: 0 10*3/uL (ref 0–0.7)
Eosinophils Relative: 0 %
HEMATOCRIT: 36.5 % — AB (ref 40.0–52.0)
HEMOGLOBIN: 10.8 g/dL — AB (ref 13.0–18.0)
Lymphocytes Relative: 8 %
Lymphs Abs: 0.8 10*3/uL — ABNORMAL LOW (ref 1.0–3.6)
MCH: 30 pg (ref 26.0–34.0)
MCHC: 29.6 g/dL — ABNORMAL LOW (ref 32.0–36.0)
MCV: 101.2 fL — AB (ref 80.0–100.0)
MONOS PCT: 1 %
Metamyelocytes Relative: 31 %
Monocytes Absolute: 0.1 10*3/uL — ABNORMAL LOW (ref 0.2–1.0)
Myelocytes: 7 %
NEUTROS ABS: 9.4 10*3/uL — AB (ref 1.4–6.5)
Neutrophils Relative %: 4 %
Other: 0 %
PROMYELOCYTES RELATIVE: 1 %
Platelets: 276 10*3/uL (ref 150–440)
RBC: 3.6 MIL/uL — AB (ref 4.40–5.90)
RDW: 16.1 % — AB (ref 11.5–14.5)
WBC: 10.3 10*3/uL (ref 3.8–10.6)
nRBC: 6 /100 WBC — ABNORMAL HIGH

## 2017-12-22 LAB — RAPID HIV SCREEN (HIV 1/2 AB+AG)
HIV 1/2 ANTIBODIES: NONREACTIVE
HIV-1 P24 ANTIGEN - HIV24: NONREACTIVE

## 2017-12-22 LAB — INFLUENZA PANEL BY PCR (TYPE A & B)
INFLAPCR: POSITIVE — AB
Influenza B By PCR: NEGATIVE

## 2017-12-22 LAB — GLUCOSE, CAPILLARY

## 2017-12-22 LAB — ETHANOL: Alcohol, Ethyl (B): <10 mg/dL (ref <10)

## 2017-12-22 LAB — BETA-HYDROXYBUTYRIC ACID: Beta-Hydroxybutyric Acid: >8 mmol/L — ABNORMAL HIGH (ref 0.05–0.27)

## 2017-12-22 MED ORDER — HALOPERIDOL LACTATE 5 MG/ML IJ SOLN: 5.0000 mg | Freq: Once | INTRAMUSCULAR | Status: DC

## 2017-12-22 MED ORDER — SODIUM BICARBONATE 8.4 % IV SOLN
INTRAVENOUS | Status: DC
  Administered 2017-12-22: 23:00:00 via INTRAVENOUS
  Filled 2017-12-22 (×4): qty 150

## 2017-12-22 MED ORDER — MAGNESIUM SULFATE 2 GM/50ML IV SOLN
2.0000 g | Freq: Once | INTRAVENOUS | Status: AC
  Administered 2017-12-22: 2 g via INTRAVENOUS
  Filled 2017-12-22: qty 50

## 2017-12-22 MED ORDER — FENTANYL CITRATE (PF) 2500 MCG/50ML IJ SOLN: 50.0000 ug/h | INTRAMUSCULAR | Status: DC

## 2017-12-22 MED ORDER — POTASSIUM CHLORIDE 10 MEQ/100ML IV SOLN
10.0000 meq | Freq: Once | INTRAVENOUS | Status: AC
  Administered 2017-12-23: 10 meq via INTRAVENOUS
  Filled 2017-12-22: qty 100

## 2017-12-22 MED ORDER — FENTANYL CITRATE (PF) 100 MCG/2ML IJ SOLN
100.0000 ug | Freq: Once | INTRAMUSCULAR | Status: AC
  Administered 2017-12-22: 100 ug via INTRAVENOUS

## 2017-12-22 MED ORDER — VANCOMYCIN HCL IN DEXTROSE 1-5 GM/200ML-% IV SOLN
1000.0000 mg | Freq: Once | INTRAVENOUS | Status: AC
  Administered 2017-12-22: 1000 mg via INTRAVENOUS
  Filled 2017-12-22: qty 200

## 2017-12-22 MED ORDER — POTASSIUM CHLORIDE 10 MEQ/100ML IV SOLN
10.0000 meq | Freq: Once | INTRAVENOUS | Status: DC
  Filled 2017-12-22: qty 100

## 2017-12-22 MED ORDER — KETAMINE HCL 50 MG/ML IJ SOLN
INTRAMUSCULAR | Status: AC
  Filled 2017-12-22: qty 10

## 2017-12-22 MED ORDER — SODIUM CHLORIDE 0.9 % IV BOLUS
1000.0000 mL | Freq: Once | INTRAVENOUS | Status: AC
  Administered 2017-12-22: 1000 mL via INTRAVENOUS

## 2017-12-22 MED ORDER — KETAMINE HCL 50 MG/ML IJ SOLN
200.0000 mg | Freq: Once | INTRAMUSCULAR | Status: AC
  Administered 2017-12-22: 200 mg via INTRAMUSCULAR

## 2017-12-22 MED ORDER — SODIUM CHLORIDE 0.9 % IV SOLN
INTRAVENOUS | Status: DC
  Administered 2017-12-22: 0.9 [IU]/h via INTRAVENOUS
  Filled 2017-12-22: qty 1

## 2017-12-22 MED ORDER — PIPERACILLIN-TAZOBACTAM 3.375 G IVPB 30 MIN: 3.3750 g | Freq: Once | INTRAVENOUS | Status: DC

## 2017-12-22 MED ORDER — KETAMINE HCL 50 MG/ML IJ SOLN: 4.0000 mg/kg | Freq: Once | INTRAMUSCULAR | Status: DC

## 2017-12-22 MED ORDER — VANCOMYCIN HCL 500 MG IV SOLR: 500.0000 mg | INTRAVENOUS | Status: DC

## 2017-12-22 MED ORDER — HALOPERIDOL LACTATE 5 MG/ML IJ SOLN
5.0000 mg | Freq: Once | INTRAMUSCULAR | Status: AC
  Administered 2017-12-23: 5 mg via INTRAVENOUS
  Filled 2017-12-22: qty 1

## 2017-12-22 MED ORDER — OSELTAMIVIR PHOSPHATE 30 MG PO CAPS
30.0000 mg | ORAL_CAPSULE | Freq: Every day | ORAL | Status: DC
  Administered 2017-12-23 – 2017-12-24 (×2): 30 mg
  Filled 2017-12-22 (×3): qty 1

## 2017-12-22 MED ORDER — SODIUM CHLORIDE 0.9 % IV SOLN
2.0000 g | Freq: Once | INTRAVENOUS | Status: AC
  Administered 2017-12-22: 2 g via INTRAVENOUS
  Filled 2017-12-22: qty 2

## 2017-12-22 MED ORDER — MIDAZOLAM HCL 5 MG/5ML IJ SOLN
4.0000 mg | Freq: Once | INTRAMUSCULAR | Status: AC
  Administered 2017-12-22: 4 mg via INTRAVENOUS

## 2017-12-22 MED ORDER — SODIUM CHLORIDE 0.9 % IV SOLN
1.0000 g | INTRAVENOUS | Status: DC
  Filled 2017-12-22: qty 1

## 2017-12-22 MED ORDER — VECURONIUM BROMIDE 10 MG IV SOLR
10.0000 mg | Freq: Once | INTRAVENOUS | Status: AC
  Administered 2017-12-22: 10 mg via INTRAVENOUS

## 2017-12-22 MED ORDER — DEXTROSE-NACL 5-0.45 % IV SOLN: INTRAVENOUS | Status: DC

## 2017-12-22 MED ORDER — FENTANYL 2500MCG IN NS 250ML (10MCG/ML) PREMIX INFUSION
0.0000 ug/h | INTRAVENOUS | Status: DC
  Administered 2017-12-22: 50 ug/h via INTRAVENOUS
  Administered 2017-12-23: 75 ug/h via INTRAVENOUS
  Filled 2017-12-22 (×2): qty 250

## 2017-12-22 NOTE — ED Notes (Signed)
Shizuo Biskup (pt's sister) [phone]

## 2017-12-22 NOTE — ED Provider Notes (Signed)
[facility] Emergency Department Provider Note  ____________________________________________   First MD Initiated Contact with Patient 12/22/17 1949     (approximate)  I have reviewed the triage vital signs and the nursing notes.   HISTORY  Chief Complaint Hyperglycemia  5 exemption history limited by the patient's clinical condition  HPI Maurice Davidson is a 32 y.o. male who comes to the emergency department via EMS for elevated blood sugar as well as cough.  The patient arrives combative and uncooperative and says only that he has felt "sick" for the past several days.  Family eventually came to bedside and said that several members of the family have had influenza for the past week or so and the patient himself is not eaten recently.  He has a long-standing history of type 1 diabetes and has had DKA in the past.  Past Medical History:  Diagnosis Date  . Anemia   . Anxiety   . Aspiration pneumonia ([place])   . Chronic pain    take Suboxone  . Diabetes mellitus without complication ([place])   . Drug-seeking behavior   . Encephalopathy   . Gastroparesis   . H/O: GI bleed   . HTN (hypertension)   . MRSA (methicillin resistant staph aureus) culture positive   . Opioid dependence ([place])   . Renal disorder   . Seizures ([place])   . Sepsis [facility])     Patient Active Problem List   Diagnosis Date Noted  . Severe sepsis ([place]) 12/22/2017  . DKA (diabetic ketoacidoses) ([place]) 12/22/2017  . Multifocal pneumonia 12/22/2017  . Acute renal failure superimposed on stage 3 chronic kidney disease ([place]) 12/22/2017  . Depression, recurrent ([place]) 06/25/2017  . HTN (hypertension) 11/27/2015  . GI bleed 11/27/2015  . Hypokalemia 11/27/2015  . Protein-calorie malnutrition, severe ([place]) 06/03/2015  . History of sepsis 06/02/2015  . Stomach pain 10/20/2013  . Thrombocytosis ([place]) 10/10/2013  . Anxiety 10/10/2013  . Encephalopathy 10/07/2013  . Diabetes mellitus type 1  ([place]) 10/07/2013  . MRSA bacteremia 10/07/2013  . Seizures ([place]) 10/07/2013  . History of aspiration pneumonia 10/07/2013  . Anemia of chronic disease 10/07/2013    Past Surgical History:  Procedure Laterality Date  . ORTHOPEDIC SURGERY      Prior to Admission medications   Medication Sig Start Date End Date Taking? Authorizing Provider  acetaminophen (TYLENOL) 500 MG tablet Take 500 mg by mouth every 6 (six) hours as needed for mild pain.    Yes [provider]  budesonide-formoterol (SYMBICORT) 160-4.5 MCG/ACT inhaler Inhale 2 puffs into the lungs 2 (two) times daily. 10/21/17  Yes Carles Collet M, PA-C  Buprenorphine HCl-Naloxone HCl (SUBOXONE) 8-2 MG FILM Place 1 Film under the tongue 2 (two) times daily.   Yes [provider]  glucagon (GLUCAGON EMERGENCY) 1 MG injection Inject 1 mg into the vein once as needed. 10/21/17  Yes Carles Collet M, PA-C  ibuprofen (ADVIL,MOTRIN) 200 MG tablet Take 600-800 mg by mouth every 6 (six) hours as needed for headache or mild pain.   Yes [provider]  insulin glargine (LANTUS) 100 UNIT/ML injection Inject 10 Units into the skin at bedtime.    Yes [provider]  Multiple Vitamins-Minerals (MULTIVITAMIN WITH MINERALS) tablet Take 1 tablet by mouth daily.   Yes [provider]  sertraline (ZOLOFT) 100 MG tablet Take 2 tablets (200 mg total) by mouth daily. 06/18/17 12/22/17 Yes Pollak, Wendee Beavers, PA-C  traZODone (DESYREL) 50 MG tablet Take 3  tablets (150 mg total) by mouth at bedtime. 10/21/17 01/19/18 Yes Trinna Post, PA-C  gabapentin (NEURONTIN) 600 MG tablet Take 2 tablets (1,200 mg total) by mouth 3 (three) times daily. 06/18/17 09/16/17  Trinna Post, PA-C    Allergies Benzodiazepines and Abilify [aripiprazole]  Family History  Problem Relation Age of Onset  . Clotting disorder Sister        Von Willebrand's disease plus polycythemia vera  . Hypertension Mother     Social History Social  History   Tobacco Use  . Smoking status: Current Some Day Smoker    Packs/day: 1.00    Types: Cigarettes  . Smokeless tobacco: Never Used  Substance Use Topics  . Alcohol use: No    Alcohol/week: 0.0 oz  . Drug use: Yes    Comment: marijuana, cocaine    Review of Systems Level 5 exemption history limited by the patient's clinical condition  ____________________________________________   PHYSICAL EXAM:  VITAL SIGNS: ED Triage Vitals  Enc Vitals Group     BP      Pulse      Resp      Temp      Temp src      SpO2      Weight      Height      Head Circumference      Peak Flow      Pain Score      Pain Loc      Pain Edu?      Excl. in [place]?     Constitutional: Agitated, diaphoretic, appears critically ill.  Cachectic.  Heavy ketones on his breath Eyes: PERRL EOMI. mid range and brisk Head: Atraumatic. Nose: No congestion/rhinnorhea. Mouth/Throat: No trismus Neck: No stridor.   Cardiovascular: Tachycardic rate, regular rhythm. Grossly normal heart sounds.  Good peripheral circulation. Respiratory: Increased respiratory effort with rales in all fields Gastrointestinal: Cachectic soft nontender Musculoskeletal: No lower extremity edema   Neurologic: Moves all 4 Skin: Diaphoretic Psychiatric: Encephalopathic    ____________________________________________   DIFFERENTIAL includes but not limited to  Diabetic ketoacidosis, HHS, dehydration, sepsis, influenza ____________________________________________   LABS (all labs ordered are listed, but only abnormal results are displayed)  Labs Reviewed  BLOOD GAS, VENOUS - Abnormal; Notable for the following components:      Result Value   pH, Ven 6.97 (*)    pCO2, Ven 35 (*)    Bicarbonate 8.1 (*)    Acid-base deficit 22.7 (*)    All other components within normal limits  COMPREHENSIVE METABOLIC PANEL - Abnormal; Notable for the following components:   Sodium 129 (*)    Chloride 95 (*)    CO2 9 (*)     Glucose, Bld 937 (*)    BUN 93 (*)    Creatinine, Ser 3.12 (*)    Calcium 6.8 (*)    Albumin 2.8 (*)    AST 42 (*)    Alkaline Phosphatase 136 (*)    Total Bilirubin 1.8 (*)    GFR calc non Af Amer 25 (*)    GFR calc Af Amer 29 (*)    Anion gap 25 (*)    All other components within normal limits  CBC WITH DIFFERENTIAL/PLATELET - Abnormal; Notable for the following components:   RBC 3.60 (*)    Hemoglobin 10.8 (*)    HCT 36.5 (*)    MCV 101.2 (*)    MCHC 29.6 (*)    RDW 16.1 (*)    nRBC  6 (*)    Neutro Abs 9.4 (*)    Lymphs Abs 0.8 (*)    Monocytes Absolute 0.1 (*)    All other components within normal limits  BETA-HYDROXYBUTYRIC ACID - Abnormal; Notable for the following components:   Beta-Hydroxybutyric Acid >8.00 (*)    All other components within normal limits  GLUCOSE, CAPILLARY - Abnormal; Notable for the following components:   Glucose-Capillary >600 (*)    All other components within normal limits  INFLUENZA PANEL BY PCR (TYPE A & B) - Abnormal; Notable for the following components:   Influenza A By PCR POSITIVE (*)    All other components within normal limits  CULTURE, BLOOD (ROUTINE X 2)  CULTURE, BLOOD (ROUTINE X 2)  ETHANOL  RAPID HIV SCREEN (HIV 1/2 AB+AG)  URINALYSIS, COMPLETE (UACMP) WITH MICROSCOPIC  URINE DRUG SCREEN, QUALITATIVE ([facility] ONLY)  PROCALCITONIN  PATHOLOGIST SMEAR REVIEW  LACTIC ACID, PLASMA    Lab work reviewed by me with a number of abnormalities including extremely elevated blood sugar with high anion gap, dangerously low pH, elevated beta hydroxybutyric acid all concerning for diabetic ketoacidosis __________________________________________  EKG  ____________________________________________  RADIOLOGY  Chest x-ray reviewed by me consistent with multifocal pneumonia ____________________________________________   PROCEDURES  Procedure(s) performed: yes  Angiocath insertion Performed by: Darel Hong  Consent: Verbal  consent obtained. Risks and benefits: risks, benefits and alternatives were discussed Time out: Immediately prior to procedure a "time out" was called to verify the correct patient, procedure, equipment, support staff and site/side marked as required.  Preparation: Patient was prepped and draped in the usual sterile fashion.  Vein Location: right EJ  Gauge: 18  Normal blood return and flush without difficulty Patient tolerance: Patient tolerated the procedure well with no immediate complications.     .Critical Care Performed by: Darel Hong, MD Authorized by: Darel Hong, MD   Critical care provider statement:    Critical care time (minutes):  45   Critical care time was exclusive of:  Separately billable procedures and treating other patients   Critical care was necessary to treat or prevent imminent or life-threatening deterioration of the following conditions:  Endocrine crisis, dehydration and sepsis   Critical care was time spent personally by me on the following activities:  Development of treatment plan with patient or surrogate, discussions with consultants, evaluation of patient's response to treatment, examination of patient, obtaining history from patient or surrogate, ordering and performing treatments and interventions, ordering and review of laboratory studies, ordering and review of radiographic studies, pulse oximetry, re-evaluation of patient's condition and review of old charts    Critical Care performed: Yes  Observation: no ____________________________________________   INITIAL IMPRESSION / ASSESSMENT AND PLAN / ED COURSE  Pertinent labs & imaging results that were available during my care of the patient were reviewed by me and considered in my medical decision making (see chart for details).  Patient arrives tachycardic, tachypneic, heavy ketones on his breath, critical high glucose which is all concerning for diabetic ketoacidosis.  The patient is  agitated and uncooperative and it is difficult to obtain IV access.  IM ketamine given for the patient's own safety to facilitate a medical workup.  The patient's pH is 6.97 with a high anion gap and sugar of over 900 which is consistent with diabetic ketoacidosis.  Insulin drip begun and given his pH less than 7 the hospitalist Dr. Jannifer Franklin has requested a bicarb drip which I think is reasonable.  Chest x-ray shows multifocal pneumonia concerning  for septic emboli.  Cefepime and vancomycin now.  At this point the patient requires inpatient admission for treatment of his multiple metabolic abnormalities and his severe sepsis.  With a lengthy discussion with family and they verbalized understanding that the patient is critically ill and may not survive this hospital stay.      ____________________________________________   FINAL CLINICAL IMPRESSION(S) / ED DIAGNOSES  Final diagnoses:  Diabetic ketoacidosis without coma associated with type 1 diabetes mellitus (HCC)  Severe sepsis ([place])  Acute kidney injury ([place])  Dehydration  Multifocal pneumonia      NEW MEDICATIONS STARTED DURING THIS VISIT:  New Prescriptions   No medications on file     Note:  This document was prepared using Dragon voice recognition software and may include unintentional dictation errors.     Darel Hong, MD 12/22/17 [phone]

## 2017-12-22 NOTE — ED Notes (Signed)
Spoke with patient's mother Maurice Davidson, pt and family have had the flu for the past week.  Per mother pt has not been eating or drinking anything for the past week.  Per mother pt has started to drink some regular sugar gatorade and water yesterday but also became combative and "talking out of his head".  Per mother pt has been very dehydrated and mother is unsure if patient has been taking his diabetes medications or not.  Pt unable to verbalize any recent history at this time.

## 2017-12-22 NOTE — Procedures (Signed)
Endotracheal Intubation: Patient required placement of an artificial airway secondary to Respiratory Failure  Consent: Emergent.   Hand washing performed prior to starting the procedure.   Medications administered for sedation prior to procedure:  Midazolam 4 mg IV,  Vecuronium 10 mg IV, Fentanyl 100 mcg IV.    A time out procedure was called and correct patient, name, & ID confirmed. Needed supplies and equipment were assembled and checked to include ETT, 10 ml syringe, Glidescope, Mac and Miller blades, suction, oxygen and bag mask valve, end tidal CO2 monitor.   Patient was positioned to align the mouth and pharynx to facilitate visualization of the glottis.   Heart rate, SpO2 and blood pressure was continuously monitored during the procedure. Pre-oxygenation was conducted prior to intubation and endotracheal tube was placed through the vocal cords into the trachea.     The artificial airway was placed under direct visualization via glidescope route using a 8.0 ETT on the first attempt.  ETT was secured at 23 cm mark.  Placement was confirmed by auscuitation of lungs with good breath sounds bilaterally and no stomach sounds.  Condensation was noted on endotracheal tube.   Pulse ox 98%.  CO2 detector in place with appropriate color change.   Complications: None .   Operator: Graceanne Guin.   Chest radiograph ordered and pending.   Comments: OGT placed via glidescope.  Drianna Chandran David Aaylah Pokorny, M.D.  [facility]  Medical Director ICU-[facility] [place] Medical Director [facility] Cardio-Pulmonary Department       

## 2017-12-22 NOTE — Consult Note (Signed)
Name: Maurice Davidson MRN: [id number] DOB: 07/09/1985     CONSULTATION DATE: 12/22/2017  CHIEF COMPLAINT: resp failure  HISTORY OF PRESENT ILLNESS:  32 yo white male thin and cachectic seen for severe resp failure Family at bedside updated and notified Patient has had sick contacts with FLU last week Patient became lethargic, not eating Very confused, struggling to breathe  Patient seen in ER, I evaluated patient Patient was emergently intubated and sedated  Elevated LA, ph 6.9 CXR + pneumonia b/l    PAST MEDICAL HISTORY :   has a past medical history of Anemia, Anxiety, Aspiration pneumonia ([place]), Chronic pain, Diabetes mellitus without complication ([place]), Drug-seeking behavior, Encephalopathy, Gastroparesis, H/O: GI bleed, HTN (hypertension), MRSA (methicillin resistant staph aureus) culture positive, Opioid dependence ([place]), Renal disorder, Seizures ([place]), and Sepsis ([place]).  has a past surgical history that includes orthopedic surgery. Prior to Admission medications   Medication Sig Start Date End Date Taking? Authorizing Provider  acetaminophen (TYLENOL) 500 MG tablet Take 500 mg by mouth every 6 (six) hours as needed for mild pain.    Yes [provider]  budesonide-formoterol (SYMBICORT) 160-4.5 MCG/ACT inhaler Inhale 2 puffs into the lungs 2 (two) times daily. 10/21/17  Yes Carles Collet M, PA-C  Buprenorphine HCl-Naloxone HCl (SUBOXONE) 8-2 MG FILM Place 1 Film under the tongue 2 (two) times daily.   Yes [provider]  glucagon (GLUCAGON EMERGENCY) 1 MG injection Inject 1 mg into the vein once as needed. 10/21/17  Yes Carles Collet M, PA-C  ibuprofen (ADVIL,MOTRIN) 200 MG tablet Take 600-800 mg by mouth every 6 (six) hours as needed for headache or mild pain.   Yes [provider]  insulin glargine (LANTUS) 100 UNIT/ML injection Inject 10 Units into the skin at bedtime.    Yes [provider]  Multiple Vitamins-Minerals (MULTIVITAMIN  WITH MINERALS) tablet Take 1 tablet by mouth daily.   Yes [provider]  sertraline (ZOLOFT) 100 MG tablet Take 2 tablets (200 mg total) by mouth daily. 06/18/17 12/22/17 Yes Pollak, Wendee Beavers, PA-C  traZODone (DESYREL) 50 MG tablet Take 3 tablets (150 mg total) by mouth at bedtime. 10/21/17 01/19/18 Yes Trinna Post, PA-C  gabapentin (NEURONTIN) 600 MG tablet Take 2 tablets (1,200 mg total) by mouth 3 (three) times daily. 06/18/17 09/16/17  Trinna Post, PA-C   Allergies  Allergen Reactions  . Benzodiazepines     Avoid prescribing  . Abilify [Aripiprazole] Rash    FAMILY HISTORY:  family history includes Clotting disorder in his sister; Hypertension in his mother. SOCIAL HISTORY:  reports that he has been smoking cigarettes.  He has been smoking about 1.00 pack per day. He has never used smokeless tobacco. He reports that he has current or past drug history. He reports that he does not drink alcohol.  REVIEW OF SYSTEMS:   Unable to obtain due to critical illness   VITAL SIGNS: Temp:  [92 F (33.3 C)] 92 F (33.3 C) (04/08 2017) Pulse Rate:  [84-95] 91 (04/08 2130) Resp:  [23-31] 31 (04/08 2130) BP: (103-115)/(58-69) 115/65 (04/08 2130) SpO2:  [91 %-95 %] 93 % (04/08 2130) Weight:  [98 lb 1.7 oz (44.5 kg)] 98 lb 1.7 oz (44.5 kg) (04/08 2020)  Physical Examination:  GENERAL:critically ill appearing, +resp distress HEAD: Normocephalic, atraumatic.  EYES: Pupils equal, round, reactive to light.  No scleral icterus.  MOUTH: Moist mucosal membrane. NECK: Supple. No JVD.  PULMONARY: +rhonchi, +wheezing CARDIOVASCULAR: S1 and S2. Regular rate  and rhythm. No murmurs, rubs, or gallops.  GASTROINTESTINAL: Soft, nontender, -distended. No masses. Positive bowel sounds. No hepatosplenomegaly.  MUSCULOSKELETAL: No swelling, clubbing, or edema.  NEUROLOGIC: obtunded SKIN:intact,warm,dry  I personally reviewed lab work that was obtained in last 24 hrs. CXR Independently  reviewed-b/l infiltarates   ASSESSMENT / PLAN: 32 yo white male with severe resp failure from Multifocal pneumonia with acute renal failure and sepsis with severe acidosis and DKA complicated by encephalopathy  1.Respiratory Failure -continue Full MV support -continue Bronchodilator Therapy -Wean Fio2 and PEEP as tolerated -will perform SAT/SBt when respiratory parameters are met   2.Sepsis -use vasopressors to keep MAP>65 -follow ABG and LA -follow up cultures -emperic ABX -IVF's   3.Renal Failure-most likely due to ATN -follow chem 7 -follow UO -continue Foley Catheter-assess need   4.DKA -insulin infusion  5.Neuro - intubated and sedated - minimal sedation to achieve a RASS goal: -1   Critical Care Time devoted to patient care services described in this note is 56 minutes.   Overall, patient is critically ill, prognosis is guarded.  Patient with Multiorgan failure and at high risk for cardiac arrest and death.    Corrin Parker, M.D.  [facility]  Medical Director [place] Director [facility] Cardio-Pulmonary Department

## 2017-12-22 NOTE — Progress Notes (Signed)
VEN ABG RESULTS NOTIFIED TO  Maurice Davidson

## 2017-12-22 NOTE — ED Notes (Signed)
Pt remains agitated after IM ketamine given.  EDP aware.

## 2017-12-22 NOTE — ED Triage Notes (Signed)
Pt brought in by [facility] from home.  Pt agitated and combative.  Pt was found to have a blood sugar reading of high.  Pt is unable/unwilling to answer any questions for this RN.

## 2017-12-22 NOTE — Progress Notes (Signed)
Pharmacy Antibiotic Note  Maurice Davidson is a 32 y.o. male admitted on 12/22/2017 with sepsis.  Pharmacy has been consulted for vanc/cefepime dosing.  Plan: Patient received vanc 1g and cefepime 2g IV x 1  Will continue vanc 500 mg IV q24h Will order vanc trough 04/13 @ 0900 prior to 4th dose. Will continue cefepime 1g IV q24h  Ke 0.02199 T1/2 30 hrs ~ will decrease dose and give q24h Goal trough 15 - 20 mcg/mL  Weight: 98 lb 1.7 oz (44.5 kg)  Temp (24hrs), Avg:92.5 F (33.6 C), Min:92 F (33.3 C), Max:93 F (33.9 C)  Recent Labs  Lab 12/22/17 1956  WBC 10.3  CREATININE 3.12*    Estimated Creatinine Clearance: 21.2 mL/min (A) (by C-G formula based on SCr of 3.12 mg/dL (H)).    Allergies  Allergen Reactions  . Benzodiazepines     Avoid prescribing  . Abilify [Aripiprazole] Rash    Thank you for allowing pharmacy to be a part of this patient's care.  Tobie Lords, PharmD, BCPS Clinical Pharmacist 12/23/2017

## 2017-12-22 NOTE — Progress Notes (Signed)
CODE SEPSIS - PHARMACY COMMUNICATION  **Broad Spectrum Antibiotics should be administered within 1 hour of Sepsis diagnosis**  Time Code Sepsis Called/Page Received: 2119  Antibiotics Ordered: vanc/cefepime  Time of 1st antibiotic administration: 2149  Additional action taken by pharmacy:   If necessary, Name of Provider/Nurse Contacted:     Tobie Lords ,PharmD Clinical Pharmacist  12/22/2017  11:57 PM

## 2017-12-22 NOTE — H&P (Signed)
[place] [facility] [place] NAME: Maurice Davidson    MR#:  [id number]  DATE OF BIRTH:  07/09/1985  DATE OF ADMISSION:  12/22/2017  PRIMARY CARE PHYSICIAN: Trinna Post, PA-C   REQUESTING/REFERRING PHYSICIAN: Mable Paris, MD  CHIEF COMPLAINT:   Chief Complaint  Patient presents with  . Hyperglycemia    HISTORY OF PRESENT ILLNESS:  Maurice Davidson  is a 32 y.o. male who presents with confusion and alteration of mental status.  Patient's family provides a history as patient is unable to do so.  They state that home they have had several members sick with the "flu" for the past week.  Patient himself has also been somewhat lethargic and not eating or drinking very much.  Yesterday the patient became combative and was "talking out of his head".  Here he is found to have multifocal pneumonia, question septic emboli on chest x-ray.  He is in DKA.  Initially he did not require intubation, but subsequently after receiving some medications including ketamine due to his combativeness he did have to be intubated.  Hospitalist were called for admission  PAST MEDICAL HISTORY:   Past Medical History:  Diagnosis Date  . Anemia   . Anxiety   . Aspiration pneumonia ([place])   . Chronic pain    take Suboxone  . Diabetes mellitus without complication ([place])   . Drug-seeking behavior   . Encephalopathy   . Gastroparesis   . H/O: GI bleed   . HTN (hypertension)   . MRSA (methicillin resistant staph aureus) culture positive   . Opioid dependence ([place])   . Renal disorder   . Seizures ([place])   . Sepsis ([place])      PAST SURGICAL HISTORY:   Past Surgical History:  Procedure Laterality Date  . ORTHOPEDIC SURGERY       SOCIAL HISTORY:   Social History   Tobacco Use  . Smoking status: Current Some Day Smoker    Packs/day: 1.00    Types: Cigarettes  . Smokeless tobacco: Never Used  Substance Use Topics  . Alcohol use: No    Alcohol/week: 0.0 oz      FAMILY HISTORY:   Family History  Problem Relation Age of Onset  . Clotting disorder Sister        Von Willebrand's disease plus polycythemia vera  . Hypertension Mother      DRUG ALLERGIES:   Allergies  Allergen Reactions  . Benzodiazepines     Avoid prescribing  . Abilify [Aripiprazole] Rash    MEDICATIONS AT HOME:   Prior to Admission medications   Medication Sig Start Date End Date Taking? Authorizing Provider  acetaminophen (TYLENOL) 500 MG tablet Take 500 mg by mouth every 6 (six) hours as needed for mild pain.    Yes [provider]  budesonide-formoterol (SYMBICORT) 160-4.5 MCG/ACT inhaler Inhale 2 puffs into the lungs 2 (two) times daily. 10/21/17  Yes Carles Collet M, PA-C  Buprenorphine HCl-Naloxone HCl (SUBOXONE) 8-2 MG FILM Place 1 Film under the tongue 2 (two) times daily.   Yes [provider]  glucagon (GLUCAGON EMERGENCY) 1 MG injection Inject 1 mg into the vein once as needed. 10/21/17  Yes Carles Collet M, PA-C  ibuprofen (ADVIL,MOTRIN) 200 MG tablet Take 600-800 mg by mouth every 6 (six) hours as needed for headache or mild pain.   Yes [provider]  insulin glargine (LANTUS) 100 UNIT/ML injection Inject 10 Units into the skin at bedtime.  Yes [provider]  Multiple Vitamins-Minerals (MULTIVITAMIN WITH MINERALS) tablet Take 1 tablet by mouth daily.   Yes [provider]  sertraline (ZOLOFT) 100 MG tablet Take 2 tablets (200 mg total) by mouth daily. 06/18/17 12/22/17 Yes Pollak, Wendee Beavers, PA-C  traZODone (DESYREL) 50 MG tablet Take 3 tablets (150 mg total) by mouth at bedtime. 10/21/17 01/19/18 Yes Trinna Post, PA-C  gabapentin (NEURONTIN) 600 MG tablet Take 2 tablets (1,200 mg total) by mouth 3 (three) times daily. 06/18/17 09/16/17  Trinna Post, PA-C    REVIEW OF SYSTEMS:  Review of Systems  Unable to perform ROS: Critical illness     VITAL SIGNS:   Vitals:   12/22/17 2045 12/22/17 2100  12/22/17 2115 12/22/17 2130  BP: 104/63 103/67 112/68 115/65  Pulse: 84 89 88 91  Resp: (!) 25 (!) 23 (!) 25 (!) 31  Temp:      TempSrc:      SpO2: 94% 95% 93% 93%  Weight:       Wt Readings from Last 3 Encounters:  12/22/17 44.5 kg (98 lb 1.7 oz)  10/21/17 44.5 kg (98 lb)  06/18/17 43.5 kg (96 lb)    PHYSICAL EXAMINATION:  Physical Exam  Vitals reviewed. Constitutional: He appears well-developed. No distress.  HENT:  Head: Normocephalic and atraumatic.  Mouth/Throat: Oropharynx is clear and moist.  Eyes: Pupils are equal, round, and reactive to light. Conjunctivae and EOM are normal. No scleral icterus.  Neck: Normal range of motion. Neck supple. No JVD present. No thyromegaly present.  Cardiovascular: Normal rate, regular rhythm and intact distal pulses. Exam reveals no gallop and no friction rub.  No murmur heard. Respiratory: He is in respiratory distress (Intubated and mechanically ventilated). He has no wheezes. He has no rales.  Coarse breath sounds throughout both lung fields  GI: Soft. Bowel sounds are normal. He exhibits no distension. There is no tenderness.  Musculoskeletal: Normal range of motion. He exhibits no edema.  No arthritis, no gout  Lymphadenopathy:    He has no cervical adenopathy.  Neurological:  Unable to fully assess due to the patient's critical illness  Skin: Skin is warm and dry. No rash noted. No erythema.  Psychiatric:  Unable to fully assess due to the patient's critical illness    LABORATORY PANEL:   CBC Recent Labs  Lab 12/22/17 1956  WBC 10.3  HGB 10.8*  HCT 36.5*  PLT 276   ------------------------------------------------------------------------------------------------------------------  Chemistries  Recent Labs  Lab 12/22/17 1956  NA 129*  K 4.3  CL 95*  CO2 9*  GLUCOSE 937*  BUN 93*  CREATININE 3.12*  CALCIUM 6.8*  AST 42*  ALT 17  ALKPHOS 136*  BILITOT 1.8*    ------------------------------------------------------------------------------------------------------------------  Cardiac Enzymes No results for input(s): TROPONINI in the last 168 hours. ------------------------------------------------------------------------------------------------------------------  RADIOLOGY:  Dg Chest Portable 1 View  Result Date: 12/22/2017 CLINICAL DATA:  Combative, agitation, hyperglycemia. History of diabetes, sepsis. EXAM: PORTABLE CHEST 1 VIEW COMPARISON:  Chest radiograph Jan 15, 2016 FINDINGS: Cardiomediastinal silhouette is normal. Multifocal consolidation bilateral lungs without pleural effusion. No pneumothorax. Soft tissue planes and included osseous structures are nonsuspicious. IMPRESSION: Multifocal pneumonia, potential septic emboli etiology. Electronically Signed   By: Elon Alas M.D.   On: 12/22/2017 20:45    EKG:   Orders placed or performed during the hospital encounter of 01/15/16  . ED EKG  . ED EKG  . EKG 12-Lead  . EKG 12-Lead    IMPRESSION AND  PLAN:  Principal Problem:   Severe sepsis ([place]) -due to multifocal pneumonia as below.  Broad-spectrum IV antibiotics in place. Active Problems:   Acute respiratory failure with hypoxia -he required intubation after being here in the ED for a while.  Currently mechanically ventilated   DKA (diabetic ketoacidoses) (HCC) -IV insulin started, patient's pH was initially 6.97 with bicarb of 9, bicarb drip started as well.   Multifocal pneumonia -broad spectrum IV antibiotics, intubated and mechanically ventilated as above, this is likely secondary to his influenza   Influenza A -vision is likely several days out from his onset of symptoms, however given his critical status we will order Tamiflu   Acute renal failure superimposed on stage 3 chronic kidney disease ([place]) -due to his sepsis, aggressive IV fluids, avoid nephrotoxins   Seizures ([place]) -unclear if the patient's home meds include any  ordered for the indication of seizures, however patient is not able to take any oral medications at this time.  He is currently sedated on a benzo drip, we will place him on seizure precautions  Chart review performed and case discussed with ED provider. Labs, imaging and/or ECG reviewed by provider and discussed with patient/family. Management plans discussed with the patient and/or family.  DVT PROPHYLAXIS: SubQ heparin  GI PROPHYLAXIS: H2 blocker  ADMISSION STATUS: Inpatient  CODE STATUS: Full Code Status History    Date Active Date Inactive Code Status Order ID Comments User Context   11/28/2015 0059 11/30/2015 1346 Full Code [id number]  Lance Coon, MD Inpatient   06/02/2015 1002 06/04/2015 1455 Full Code [id number]  Gladstone Lighter, MD Inpatient      TOTAL CRITICAL CARE TIME TAKING CARE OF THIS PATIENT: 50 minutes.   Taran Haynesworth FIELDING 12/22/2017, 10:04 PM  [facility]  Office  [phone]  CC: Primary care physician; Trinna Post, PA-C  Note:  This document was prepared using Dragon voice recognition software and may include unintentional dictation errors.

## 2017-12-23 ENCOUNTER — Inpatient Hospital Stay: Payer: Medicaid Other

## 2017-12-23 LAB — GLUCOSE, CAPILLARY
GLUCOSE-CAPILLARY: 515 mg/dL — AB (ref 65–99)
GLUCOSE-CAPILLARY: 486 mg/dL — AB (ref 65–99)
GLUCOSE-CAPILLARY: 394 mg/dL — AB (ref 65–99)
GLUCOSE-CAPILLARY: 259 mg/dL — AB (ref 65–99)
GLUCOSE-CAPILLARY: 235 mg/dL — AB (ref 65–99)
GLUCOSE-CAPILLARY: 195 mg/dL — AB (ref 65–99)
GLUCOSE-CAPILLARY: 269 mg/dL — AB (ref 65–99)
Glucose-Capillary: >600 mg/dL (ref 65–99)
Glucose-Capillary: 594 mg/dL (ref 65–99)
Glucose-Capillary: >600 mg/dL (ref 65–99)
Glucose-Capillary: 572 mg/dL (ref 65–99)
Glucose-Capillary: 461 mg/dL — ABNORMAL HIGH (ref 65–99)
Glucose-Capillary: 360 mg/dL — ABNORMAL HIGH (ref 65–99)
Glucose-Capillary: 312 mg/dL — ABNORMAL HIGH (ref 65–99)
Glucose-Capillary: 202 mg/dL — ABNORMAL HIGH (ref 65–99)
Glucose-Capillary: 208 mg/dL — ABNORMAL HIGH (ref 65–99)

## 2017-12-23 LAB — BLOOD GAS, ARTERIAL
ACID-BASE DEFICIT: 1 mmol/L (ref 0.0–2.0)
Acid-base deficit: 9.5 mmol/L — ABNORMAL HIGH (ref 0.0–2.0)
Bicarbonate: 19.2 mmol/L — ABNORMAL LOW (ref 20.0–28.0)
Bicarbonate: 25.3 mmol/L (ref 20.0–28.0)
Bicarbonate: UNDETERMINED mmol/L (ref 20.0–28.0)
FIO2: 0.6
FIO2: 1
FIO2: 100
MECHANICAL RATE: 30
MECHANICAL RATE: 35
Mechanical Rate: 20
O2 SAT: 100 %
O2 SAT: UNDETERMINED %
O2 Saturation: 98.2 %
PATIENT TEMPERATURE: 37
PATIENT TEMPERATURE: 37
PCO2 ART: 45 mmHg (ref 32.0–48.0)
PCO2 ART: 54 mmHg — AB (ref 32.0–48.0)
PEEP/CPAP: 5 cmH2O
PEEP: 5 cmH2O
PEEP: 5 cmH2O
PH ART: 7.33 — AB (ref 7.350–7.450)
PO2 ART: 333 mmHg — AB (ref 83.0–108.0)
PO2 ART: 430 mmHg — AB (ref 83.0–108.0)
Patient temperature: 37
VT: 400 mL
VT: 400 mL
VT: 400 mL
pCO2 arterial: 48 mmHg (ref 32.0–48.0)
pH, Arterial: 7.16 — CL (ref 7.350–7.450)
pO2, Arterial: 115 mmHg — ABNORMAL HIGH (ref 83.0–108.0)

## 2017-12-23 LAB — URINE DRUG SCREEN, QUALITATIVE (ARMC ONLY)
AMPHETAMINES, UR SCREEN: NOT DETECTED
BENZODIAZEPINE, UR SCRN: NOT DETECTED
Barbiturates, Ur Screen: NOT DETECTED
Cannabinoid 50 Ng, Ur ~~LOC~~: POSITIVE — AB
Cocaine Metabolite,Ur ~~LOC~~: NOT DETECTED
MDMA (Ecstasy)Ur Screen: NOT DETECTED
METHADONE SCREEN, URINE: NOT DETECTED
Opiate, Ur Screen: NOT DETECTED
PHENCYCLIDINE (PCP) UR S: NOT DETECTED
TRICYCLIC, UR SCREEN: NOT DETECTED

## 2017-12-23 LAB — BLOOD CULTURE ID PANEL (REFLEXED)
Acinetobacter baumannii: NOT DETECTED
CANDIDA GLABRATA: NOT DETECTED
CANDIDA KRUSEI: NOT DETECTED
CANDIDA PARAPSILOSIS: NOT DETECTED
CANDIDA TROPICALIS: NOT DETECTED
Candida albicans: NOT DETECTED
ENTEROBACTER CLOACAE COMPLEX: NOT DETECTED
ESCHERICHIA COLI: NOT DETECTED
Enterobacteriaceae species: NOT DETECTED
Enterococcus species: NOT DETECTED
Haemophilus influenzae: NOT DETECTED
KLEBSIELLA OXYTOCA: NOT DETECTED
KLEBSIELLA PNEUMONIAE: NOT DETECTED
Listeria monocytogenes: NOT DETECTED
Neisseria meningitidis: NOT DETECTED
PSEUDOMONAS AERUGINOSA: NOT DETECTED
Proteus species: NOT DETECTED
SERRATIA MARCESCENS: NOT DETECTED
STAPHYLOCOCCUS SPECIES: NOT DETECTED
STREPTOCOCCUS PNEUMONIAE: DETECTED — AB
Staphylococcus aureus (BCID): NOT DETECTED
Streptococcus agalactiae: NOT DETECTED
Streptococcus pyogenes: NOT DETECTED
Streptococcus species: DETECTED — AB

## 2017-12-23 LAB — URINALYSIS, COMPLETE (UACMP) WITH MICROSCOPIC
Bacteria, UA: NONE SEEN
Bilirubin Urine: NEGATIVE
Glucose, UA: >=500 mg/dL — AB
KETONES UR: 20 mg/dL — AB
Leukocytes, UA: NEGATIVE
Nitrite: NEGATIVE
PROTEIN: 30 mg/dL — AB
SQUAMOUS EPITHELIAL / LPF: NONE SEEN
Specific Gravity, Urine: 1.016 (ref 1.005–1.030)
pH: 5 (ref 5.0–8.0)

## 2017-12-23 LAB — BASIC METABOLIC PANEL
ANION GAP: 12 (ref 5–15)
ANION GAP: 7 (ref 5–15)
ANION GAP: 7 (ref 5–15)
ANION GAP: 8 (ref 5–15)
Anion gap: 21 — ABNORMAL HIGH (ref 5–15)
BUN: 90 mg/dL — ABNORMAL HIGH (ref 6–20)
BUN: 91 mg/dL — ABNORMAL HIGH (ref 6–20)
BUN: 86 mg/dL — ABNORMAL HIGH (ref 6–20)
BUN: 85 mg/dL — ABNORMAL HIGH (ref 6–20)
BUN: 84 mg/dL — ABNORMAL HIGH (ref 6–20)
CALCIUM: 6.7 mg/dL — AB (ref 8.9–10.3)
CALCIUM: 6.9 mg/dL — AB (ref 8.9–10.3)
CALCIUM: 6.9 mg/dL — AB (ref 8.9–10.3)
CALCIUM: 7 mg/dL — AB (ref 8.9–10.3)
CHLORIDE: 97 mmol/L — AB (ref 101–111)
CHLORIDE: 105 mmol/L (ref 101–111)
CHLORIDE: 107 mmol/L (ref 101–111)
CHLORIDE: 106 mmol/L (ref 101–111)
CO2: 19 mmol/L — AB (ref 22–32)
CO2: 25 mmol/L (ref 22–32)
CO2: 29 mmol/L (ref 22–32)
CO2: 29 mmol/L (ref 22–32)
CO2: 28 mmol/L (ref 22–32)
CREATININE: 3.14 mg/dL — AB (ref 0.61–1.24)
CREATININE: 2.89 mg/dL — AB (ref 0.61–1.24)
Calcium: 6.9 mg/dL — ABNORMAL LOW (ref 8.9–10.3)
Chloride: 101 mmol/L (ref 101–111)
Creatinine, Ser: 2.64 mg/dL — ABNORMAL HIGH (ref 0.61–1.24)
Creatinine, Ser: 2.68 mg/dL — ABNORMAL HIGH (ref 0.61–1.24)
Creatinine, Ser: 2.68 mg/dL — ABNORMAL HIGH (ref 0.61–1.24)
GFR calc Af Amer: 28 mL/min — ABNORMAL LOW (ref >60)
GFR calc Af Amer: 31 mL/min — ABNORMAL LOW (ref >60)
GFR calc Af Amer: 35 mL/min — ABNORMAL LOW (ref >60)
GFR calc non Af Amer: 24 mL/min — ABNORMAL LOW (ref >60)
GFR calc non Af Amer: 27 mL/min — ABNORMAL LOW (ref >60)
GFR calc non Af Amer: 30 mL/min — ABNORMAL LOW (ref >60)
GFR calc non Af Amer: 30 mL/min — ABNORMAL LOW (ref >60)
GFR calc non Af Amer: 30 mL/min — ABNORMAL LOW (ref >60)
GFR, EST AFRICAN AMERICAN: 34 mL/min — AB (ref >60)
GFR, EST AFRICAN AMERICAN: 34 mL/min — AB (ref >60)
GLUCOSE: 806 mg/dL — AB (ref 65–99)
GLUCOSE: 634 mg/dL — AB (ref 65–99)
GLUCOSE: 204 mg/dL — AB (ref 65–99)
Glucose, Bld: 397 mg/dL — ABNORMAL HIGH (ref 65–99)
Glucose, Bld: 239 mg/dL — ABNORMAL HIGH (ref 65–99)
POTASSIUM: 2.7 mmol/L — AB (ref 3.5–5.1)
Potassium: 3.3 mmol/L — ABNORMAL LOW (ref 3.5–5.1)
Potassium: 3.7 mmol/L (ref 3.5–5.1)
Potassium: 3 mmol/L — ABNORMAL LOW (ref 3.5–5.1)
Potassium: 3.2 mmol/L — ABNORMAL LOW (ref 3.5–5.1)
Sodium: 137 mmol/L (ref 135–145)
Sodium: 138 mmol/L (ref 135–145)
Sodium: 141 mmol/L (ref 135–145)
Sodium: 143 mmol/L (ref 135–145)
Sodium: 142 mmol/L (ref 135–145)

## 2017-12-23 LAB — CBC
HEMATOCRIT: 34.8 % — AB (ref 40.0–52.0)
HEMATOCRIT: 35 % — AB (ref 40.0–52.0)
HEMOGLOBIN: 11.5 g/dL — AB (ref 13.0–18.0)
Hemoglobin: 11.3 g/dL — ABNORMAL LOW (ref 13.0–18.0)
MCH: 30.5 pg (ref 26.0–34.0)
MCH: 29.6 pg (ref 26.0–34.0)
MCHC: 32.9 g/dL (ref 32.0–36.0)
MCHC: 32.2 g/dL (ref 32.0–36.0)
MCV: 92.8 fL (ref 80.0–100.0)
MCV: 91.9 fL (ref 80.0–100.0)
PLATELETS: 277 10*3/uL (ref 150–440)
Platelets: 249 10*3/uL (ref 150–440)
RBC: 3.75 MIL/uL — AB (ref 4.40–5.90)
RBC: 3.81 MIL/uL — ABNORMAL LOW (ref 4.40–5.90)
RDW: 14.5 % (ref 11.5–14.5)
RDW: 14.2 % (ref 11.5–14.5)
WBC: 7.5 10*3/uL (ref 3.8–10.6)
WBC: 7.2 10*3/uL (ref 3.8–10.6)

## 2017-12-23 LAB — TRIGLYCERIDES: Triglycerides: 368 mg/dL — ABNORMAL HIGH (ref <150)

## 2017-12-23 LAB — MAGNESIUM
MAGNESIUM: 2.6 mg/dL — AB (ref 1.7–2.4)
MAGNESIUM: 2.5 mg/dL — AB (ref 1.7–2.4)

## 2017-12-23 LAB — PROCALCITONIN: PROCALCITONIN: 17.33 ng/mL

## 2017-12-23 LAB — ALBUMIN: Albumin: 1.9 g/dL — ABNORMAL LOW (ref 3.5–5.0)

## 2017-12-23 LAB — MRSA PCR SCREENING: MRSA BY PCR: POSITIVE — AB

## 2017-12-23 LAB — LACTIC ACID, PLASMA: LACTIC ACID, VENOUS: 1.4 mmol/L (ref 0.5–1.9)

## 2017-12-23 LAB — PATHOLOGIST SMEAR REVIEW

## 2017-12-23 LAB — PHOSPHORUS

## 2017-12-23 MED ORDER — FAMOTIDINE 20 MG PO TABS
20.0000 mg | ORAL_TABLET | Freq: Every day | ORAL | Status: DC
  Administered 2017-12-24: 20 mg
  Filled 2017-12-23: qty 1

## 2017-12-23 MED ORDER — INSULIN GLARGINE 100 UNIT/ML ~~LOC~~ SOLN
10.0000 [IU] | SUBCUTANEOUS | Status: DC
  Administered 2017-12-23 – 2017-12-24 (×2): 10 [IU] via SUBCUTANEOUS
  Filled 2017-12-23 (×2): qty 0.1

## 2017-12-23 MED ORDER — ACETAMINOPHEN 325 MG PO TABS
650.0000 mg | ORAL_TABLET | ORAL | Status: DC | PRN
  Administered 2017-12-24 – 2017-12-28 (×2): 650 mg via ORAL
  Filled 2017-12-23 (×2): qty 2

## 2017-12-23 MED ORDER — SODIUM CHLORIDE 0.9 % IV SOLN
2.0000 g | Freq: Every day | INTRAVENOUS | Status: DC
  Administered 2017-12-23 – 2017-12-24 (×2): 2 g via INTRAVENOUS
  Filled 2017-12-23 (×3): qty 20

## 2017-12-23 MED ORDER — SODIUM CHLORIDE 0.9 % IV SOLN
0.0000 ug/min | INTRAVENOUS | Status: DC
  Filled 2017-12-23: qty 1

## 2017-12-23 MED ORDER — MOMETASONE FURO-FORMOTEROL FUM 200-5 MCG/ACT IN AERO
2.0000 | INHALATION_SPRAY | Freq: Two times a day (BID) | RESPIRATORY_TRACT | Status: DC
  Filled 2017-12-23: qty 8.8

## 2017-12-23 MED ORDER — VITAL 1.5 CAL PO LIQD
1000.0000 mL | ORAL | Status: DC
  Administered 2017-12-23: 1000 mL

## 2017-12-23 MED ORDER — POTASSIUM CHLORIDE 20 MEQ PO PACK
40.0000 meq | PACK | Freq: Once | ORAL | Status: AC
  Administered 2017-12-23: 40 meq
  Filled 2017-12-23: qty 2

## 2017-12-23 MED ORDER — INSULIN ASPART 100 UNIT/ML ~~LOC~~ SOLN
0.0000 [IU] | SUBCUTANEOUS | Status: DC
  Administered 2017-12-23: 5 [IU] via SUBCUTANEOUS
  Administered 2017-12-24: 3 [IU] via SUBCUTANEOUS
  Administered 2017-12-24: 1 [IU] via SUBCUTANEOUS
  Administered 2017-12-24: 5 [IU] via SUBCUTANEOUS
  Administered 2017-12-24: 7 [IU] via SUBCUTANEOUS
  Filled 2017-12-23 (×5): qty 1

## 2017-12-23 MED ORDER — ONDANSETRON HCL 4 MG/2ML IJ SOLN: 4.0000 mg | Freq: Four times a day (QID) | INTRAMUSCULAR | Status: DC | PRN

## 2017-12-23 MED ORDER — POTASSIUM PHOSPHATES 15 MMOLE/5ML IV SOLN
30.0000 mmol | Freq: Once | INTRAVENOUS | Status: AC
  Administered 2017-12-23: 30 mmol via INTRAVENOUS
  Filled 2017-12-23: qty 10

## 2017-12-23 MED ORDER — SODIUM CHLORIDE 0.9 % IV SOLN: 250.0000 mL | INTRAVENOUS | Status: DC | PRN

## 2017-12-23 MED ORDER — POTASSIUM CHLORIDE 10 MEQ/100ML IV SOLN
10.0000 meq | INTRAVENOUS | Status: DC
  Filled 2017-12-23 (×2): qty 100

## 2017-12-23 MED ORDER — FAMOTIDINE IN NACL 20-0.9 MG/50ML-% IV SOLN
20.0000 mg | Freq: Every day | INTRAVENOUS | Status: DC
  Administered 2017-12-23: 20 mg via INTRAVENOUS
  Filled 2017-12-23: qty 50

## 2017-12-23 MED ORDER — SODIUM CHLORIDE 0.9 % IV SOLN
0.0000 ug/min | INTRAVENOUS | Status: DC
  Administered 2017-12-23: 5 ug/min via INTRAVENOUS
  Filled 2017-12-23: qty 1
  Filled 2017-12-23: qty 10

## 2017-12-23 MED ORDER — BUDESONIDE 0.5 MG/2ML IN SUSP
0.5000 mg | Freq: Two times a day (BID) | RESPIRATORY_TRACT | Status: DC
  Administered 2017-12-23 – 2017-12-24 (×3): 0.5 mg via RESPIRATORY_TRACT
  Filled 2017-12-23 (×3): qty 2

## 2017-12-23 MED ORDER — BISACODYL 10 MG RE SUPP: 10.0000 mg | Freq: Once | RECTAL | Status: DC

## 2017-12-23 MED ORDER — ASPIRIN 300 MG RE SUPP: 300.0000 mg | RECTAL | Status: DC

## 2017-12-23 MED ORDER — ADULT MULTIVITAMIN LIQUID CH
15.0000 mL | Freq: Every day | ORAL | Status: DC
  Administered 2017-12-23 – 2017-12-27 (×3): 15 mL
  Filled 2017-12-23 (×6): qty 15

## 2017-12-23 MED ORDER — ASPIRIN 81 MG PO CHEW: 324.0000 mg | CHEWABLE_TABLET | ORAL | Status: DC

## 2017-12-23 MED ORDER — SENNOSIDES-DOCUSATE SODIUM 8.6-50 MG PO TABS
1.0000 | ORAL_TABLET | Freq: Two times a day (BID) | ORAL | Status: DC
  Administered 2017-12-24: 1
  Filled 2017-12-23: qty 1

## 2017-12-23 MED ORDER — CHLORHEXIDINE GLUCONATE 0.12% ORAL RINSE (MEDLINE KIT)
15.0000 mL | Freq: Two times a day (BID) | OROMUCOSAL | Status: DC
  Administered 2017-12-23 – 2017-12-31 (×12): 15 mL via OROMUCOSAL

## 2017-12-23 MED ORDER — IPRATROPIUM-ALBUTEROL 0.5-2.5 (3) MG/3ML IN SOLN
3.0000 mL | RESPIRATORY_TRACT | Status: DC
  Administered 2017-12-23 – 2017-12-24 (×6): 3 mL via RESPIRATORY_TRACT
  Filled 2017-12-23 (×6): qty 3

## 2017-12-23 MED ORDER — PRO-STAT SUGAR FREE PO LIQD
30.0000 mL | Freq: Every day | ORAL | Status: DC
  Administered 2017-12-23 – 2017-12-24 (×2): 30 mL

## 2017-12-23 MED ORDER — ORAL CARE MOUTH RINSE
15.0000 mL | OROMUCOSAL | Status: DC
  Administered 2017-12-23 – 2017-12-25 (×23): 15 mL via OROMUCOSAL

## 2017-12-23 MED ORDER — SODIUM CHLORIDE 0.9 % IV SOLN
INTRAVENOUS | Status: DC
  Administered 2017-12-23: 21 [IU]/h via INTRAVENOUS
  Administered 2017-12-23: 26.7 [IU]/h via INTRAVENOUS
  Administered 2017-12-23: 20.5 [IU]/h via INTRAVENOUS
  Filled 2017-12-23 (×3): qty 1

## 2017-12-23 MED ORDER — SODIUM BICARBONATE 8.4 % IV SOLN
200.0000 meq | Freq: Once | INTRAVENOUS | Status: AC
  Administered 2017-12-23: 200 meq via INTRAVENOUS
  Filled 2017-12-23: qty 200

## 2017-12-23 MED ORDER — SODIUM CHLORIDE 0.9 % IV SOLN
INTRAVENOUS | Status: DC
  Administered 2017-12-23: 01:00:00 via INTRAVENOUS

## 2017-12-23 MED ORDER — HEPARIN SODIUM (PORCINE) 5000 UNIT/ML IJ SOLN
5000.0000 [IU] | Freq: Three times a day (TID) | INTRAMUSCULAR | Status: DC
  Administered 2017-12-23 – 2017-12-31 (×25): 5000 [IU] via SUBCUTANEOUS
  Filled 2017-12-23 (×22): qty 1

## 2017-12-23 MED ORDER — GABAPENTIN 600 MG PO TABS
1200.0000 mg | ORAL_TABLET | Freq: Three times a day (TID) | ORAL | Status: DC
  Administered 2017-12-23 – 2017-12-24 (×4): 1200 mg
  Filled 2017-12-23 (×4): qty 2

## 2017-12-23 MED ORDER — PROPOFOL 1000 MG/100ML IV EMUL
0.0000 ug/kg/min | INTRAVENOUS | Status: DC
  Administered 2017-12-23: 60 ug/kg/min via INTRAVENOUS
  Administered 2017-12-23 – 2017-12-24 (×2): 50 ug/kg/min via INTRAVENOUS
  Filled 2017-12-23 (×2): qty 100

## 2017-12-23 MED ORDER — SERTRALINE HCL 50 MG PO TABS
200.0000 mg | ORAL_TABLET | Freq: Every day | ORAL | Status: DC
  Administered 2017-12-24: 200 mg
  Filled 2017-12-23: qty 4

## 2017-12-23 MED ORDER — VITAL HIGH PROTEIN PO LIQD: 1000.0000 mL | ORAL | Status: DC

## 2017-12-23 MED ORDER — DEXTROSE-NACL 5-0.45 % IV SOLN
INTRAVENOUS | Status: DC
  Administered 2017-12-23: 125 mL/h via INTRAVENOUS

## 2017-12-23 MED ORDER — SODIUM CHLORIDE 0.9 % IV SOLN
INTRAVENOUS | Status: DC
  Administered 2017-12-23 (×2): via INTRAVENOUS

## 2017-12-23 NOTE — Progress Notes (Signed)
[place] at [facility]                                                                                                                                                                                  Patient Demographics   Maurice Davidson, is a 32 y.o. male, DOB - 07/09/1985, IOX:[id number]  Admit date - 12/22/2017   Admitting Physician Lance Coon, MD  Outpatient Primary MD for the patient is Trinna Post, PA-C   LOS - 1  Subjective: Patient admitted with severe sepsis currently on the ventilator    Review of Systems:   CONSTITUTIONAL: Intubated  Vitals:   Vitals:   12/23/17 1000 12/23/17 1100 12/23/17 1200 12/23/17 1300  BP: 96/60 138/80 (!) 69/40 (!) 106/48  Pulse: (!) 104 (!) 121 (!) 107 (!) 105  Resp: (!) 35 (!) 23 (!) 35 17  Temp:  98.8 F (37.1 C) 98.4 F (36.9 C) 97.9 F (36.6 C)  TempSrc:      SpO2: 94% 92% 94% 93%  Weight:      Height:        Wt Readings from Last 3 Encounters:  12/23/17 41.2 kg (90 lb 13.3 oz)  10/21/17 44.5 kg (98 lb)  06/18/17 43.5 kg (96 lb)     Intake/Output Summary (Last 24 hours) at 12/23/2017 1518 Last data filed at 12/23/2017 0845 Gross per 24 hour  Intake 5337.18 ml  Output 975 ml  Net 4362.18 ml    Physical Exam:   GENERAL: Critically ill HEAD, EYES, EARS, NOSE AND THROAT: Atraumatic, normocephalic. Extraocular muscles are intact. Pupils equal and reactive to light. Sclerae anicteric. No conjunctival injection. No oro-pharyngeal erythema.  NECK: Supple. There is no jugular venous distention. No bruits, no lymphadenopathy, no thyromegaly.  HEART: Regular rate and rhythm,. No murmurs, no rubs, no clicks.  LUNGS: ET tube in place, diminished breath sounds ABDOMEN: Soft, flat, nontender, nondistended. Has good bowel sounds. No hepatosplenomegaly appreciated.  EXTREMITIES: No evidence of any cyanosis, clubbing, or peripheral edema.  +2 pedal and radial pulses bilaterally.  NEUROLOGIC:  Sedated SKIN: Moist and warm with no rashes appreciated.  Psych: Sedated LN: No inguinal LN enlargement    Antibiotics   Anti-infectives (From admission, onward)   Start     Dose/Rate Route Frequency Ordered Stop   12/24/17 1000  vancomycin (VANCOCIN) 500 mg in sodium chloride 0.9 % 100 mL IVPB  Status:  Discontinued     500 mg 100 mL/hr over 60 Minutes Intravenous Every 24 hours 12/22/17 2357 12/23/17 0920   12/23/17 2100  ceFEPIme (MAXIPIME) 1 g in sodium chloride 0.9 % 100 mL IVPB  Status:  Discontinued  1 g 200 mL/hr over 30 Minutes Intravenous Every 24 hours 12/22/17 2357 12/23/17 1412   12/23/17 1800  cefTRIAXone (ROCEPHIN) 2 g in sodium chloride 0.9 % 100 mL IVPB     2 g 200 mL/hr over 30 Minutes Intravenous Daily-1800 12/23/17 1413     12/22/17 2345  oseltamivir (TAMIFLU) capsule 30 mg     30 mg Per Tube Daily 12/22/17 2305 12/27/17 0959   12/22/17 2130  ceFEPIme (MAXIPIME) 2 g in sodium chloride 0.9 % 100 mL IVPB     2 g 200 mL/hr over 30 Minutes Intravenous  Once 12/22/17 2127 12/22/17 2246   12/22/17 2115  piperacillin-tazobactam (ZOSYN) IVPB 3.375 g  Status:  Discontinued     3.375 g 100 mL/hr over 30 Minutes Intravenous  Once 12/22/17 2114 12/22/17 2118   12/22/17 2115  vancomycin (VANCOCIN) IVPB 1000 mg/200 mL premix     1,000 mg 200 mL/hr over 60 Minutes Intravenous  Once 12/22/17 2114 12/22/17 2308      Medications   Scheduled Meds: . aspirin  324 mg Oral NOW   Or  . aspirin  300 mg Rectal NOW  . budesonide (PULMICORT) nebulizer solution  0.5 mg Nebulization BID  . chlorhexidine gluconate (MEDLINE KIT)  15 mL Mouth Rinse BID  . [START ON 12/24/2017] famotidine  20 mg Per Tube Daily  . feeding supplement (PRO-STAT SUGAR FREE 64)  30 mL Per Tube Daily  . feeding supplement (VITAL 1.5 CAL)  1,000 mL Per Tube Q24H  . gabapentin  1,200 mg Per Tube TID  . heparin  5,000 Units Subcutaneous Q8H  . ipratropium-albuterol  3 mL Nebulization Q4H  . mouth rinse  15  mL Mouth Rinse 10 times per day  . multivitamin  15 mL Per Tube Daily  . oseltamivir  30 mg Per Tube Daily  . [START ON 12/24/2017] sertraline  200 mg Per Tube Daily   Continuous Infusions: . sodium chloride    . sodium chloride 150 mL/hr at 12/23/17 0912  . cefTRIAXone (ROCEPHIN)  IV    . dextrose 5 % and 0.45% NaCl 125 mL/hr (12/23/17 1509)  . fentaNYL 100 mcg/hr (12/23/17 0600)  . insulin (NOVOLIN-R) infusion 18.5 Units/hr (12/23/17 1650)  . propofol (DIPRIVAN) infusion 30 mcg/kg/min (12/23/17 1143)   PRN Meds:.sodium chloride, acetaminophen, ondansetron (ZOFRAN) IV   Data Review:   Micro Results Recent Results (from the past 240 hour(s))  Blood Culture (routine x 2)     Status: None (Preliminary result)   Collection Time: 12/22/17  9:31 PM  Result Value Ref Range Status   Specimen Description BLOOD RIGHT ANTECUBITAL  Final   Special Requests   Final    BOTTLES DRAWN AEROBIC AND ANAEROBIC Blood Culture results may not be optimal due to an excessive volume of blood received in culture bottles   Culture  Setup Time   Final    Organism ID to follow [place] AND ANAEROBIC BOTTLES CRITICAL RESULT CALLED TO, READ BACK BY AND VERIFIED WITH: LISA KLUTTZ AT 9892 12/23/17 SDR Performed at [facility], [address], [place], [place] [address]    Culture GRAM POSITIVE COCCI  Final   Report Status PENDING  Incomplete  Blood Culture (routine x 2)     Status: None (Preliminary result)   Collection Time: 12/22/17  9:31 PM  Result Value Ref Range Status   Specimen Description BLOOD BLOOD LEFT FOREARM  Final   Special Requests   Final  BOTTLES DRAWN AEROBIC AND ANAEROBIC Blood Culture adequate volume   Culture  Setup Time   Final    GRAM POSITIVE COCCI IN BOTH AEROBIC AND ANAEROBIC BOTTLES CRITICAL VALUE NOTED.  VALUE IS CONSISTENT WITH PREVIOUSLY REPORTED AND CALLED VALUE. Performed at [facility], [address] [place], [place]  [address]    Culture [facility] POSITIVE COCCI  Final   Report Status PENDING  Incomplete  Blood Culture ID Panel (Reflexed)     Status: Abnormal   Collection Time: 12/22/17  9:31 PM  Result Value Ref Range Status   Enterococcus species NOT DETECTED NOT DETECTED Final   Listeria monocytogenes NOT DETECTED NOT DETECTED Final   Staphylococcus species NOT DETECTED NOT DETECTED Final   Staphylococcus aureus NOT DETECTED NOT DETECTED Final   Streptococcus species DETECTED (A) NOT DETECTED Final    Comment: CRITICAL RESULT CALLED TO, READ BACK BY AND VERIFIED WITH:  LISA KLUTTZ AT 1914 12/23/17 SDR    Streptococcus agalactiae NOT DETECTED NOT DETECTED Final   Streptococcus pneumoniae DETECTED (A) NOT DETECTED Final    Comment: CRITICAL RESULT CALLED TO, READ BACK BY AND VERIFIED WITH:  LISA KLUTTZ AT 7829 12/23/17 SDR    Streptococcus pyogenes NOT DETECTED NOT DETECTED Final   Acinetobacter baumannii NOT DETECTED NOT DETECTED Final   Enterobacteriaceae species NOT DETECTED NOT DETECTED Final   Enterobacter cloacae complex NOT DETECTED NOT DETECTED Final   Escherichia coli NOT DETECTED NOT DETECTED Final   Klebsiella oxytoca NOT DETECTED NOT DETECTED Final   Klebsiella pneumoniae NOT DETECTED NOT DETECTED Final   Proteus species NOT DETECTED NOT DETECTED Final   Serratia marcescens NOT DETECTED NOT DETECTED Final   Haemophilus influenzae NOT DETECTED NOT DETECTED Final   Neisseria meningitidis NOT DETECTED NOT DETECTED Final   Pseudomonas aeruginosa NOT DETECTED NOT DETECTED Final   Candida albicans NOT DETECTED NOT DETECTED Final   Candida glabrata NOT DETECTED NOT DETECTED Final   Candida krusei NOT DETECTED NOT DETECTED Final   Candida parapsilosis NOT DETECTED NOT DETECTED Final   Candida tropicalis NOT DETECTED NOT DETECTED Final    Comment: Performed at [facility], [address] [place], [place] [address]  MRSA PCR Screening     Status: Abnormal   Collection Time: 12/23/17  8:47  AM  Result Value Ref Range Status   MRSA by PCR POSITIVE (A) NEGATIVE Final    Comment:        The GeneXpert MRSA Assay (FDA approved for NASAL specimens only), is one component of a comprehensive MRSA colonization surveillance program. It is not intended to diagnose MRSA infection nor to guide or monitor treatment for MRSA infections. RESULT CALLED TO, READ BACK BY AND VERIFIED WITH: C/ MYRA FLOWERS _0  12/23/17 [facility] Performed at [facility], [address], [place], [place] [address]     Radiology Reports Dg Abd 1 View  Result Date: 12/23/2017 CLINICAL DATA:  Orogastric tube placement EXAM: ABDOMEN - 1 VIEW COMPARISON:  December 22, 2017 FINDINGS: Orogastric tube tip and side port in proximal stomach. Visualized bowel gas pattern is unremarkable. No evident bowel obstruction or free air. Endotracheal tube tip is 5.1 cm above the carina. No pneumothorax. Multiple foci of airspace disease noted bilaterally. IMPRESSION: Orogastric tube tip and side port in proximal stomach. Visualized bowel gas pattern unremarkable. Multiple foci of airspace disease in the lungs. Electronically Signed   By: Lowella Grip III M.D.   On: 12/23/2017 10:58   Dg Chest Portable 1 View  Result Date: 12/22/2017 CLINICAL DATA:  ETT placement EXAM: PORTABLE CHEST 1 VIEW COMPARISON:  12/22/2017, 01/15/2016 FINDINGS: Endotracheal tube tip is about 4.6 cm superior to the carina. Esophageal tube side-port overlies the distal esophagus. Worsening right greater than left ground-glass opacity and airspace disease. Normal heart size. No pneumothorax. IMPRESSION: 1. Endotracheal tube tip about 4.6 cm superior to carina 2. Esophageal tube side port overlies distal esophagus, suggest further advancement for more optimal positioning 3. Worsening of multifocal airspace disease, right greater than left consistent with multifocal pneumonia Electronically Signed   By: Donavan Foil M.D.   On: 12/22/2017 23:21   Dg Chest  Portable 1 View  Result Date: 12/22/2017 CLINICAL DATA:  Combative, agitation, hyperglycemia. History of diabetes, sepsis. EXAM: PORTABLE CHEST 1 VIEW COMPARISON:  Chest radiograph Jan 15, 2016 FINDINGS: Cardiomediastinal silhouette is normal. Multifocal consolidation bilateral lungs without pleural effusion. No pneumothorax. Soft tissue planes and included osseous structures are nonsuspicious. IMPRESSION: Multifocal pneumonia, potential septic emboli etiology. Electronically Signed   By: Elon Alas M.D.   On: 12/22/2017 20:45   Dg Abd Portable 1 View  Result Date: 12/22/2017 CLINICAL DATA:  NG tube EXAM: PORTABLE ABDOMEN - 1 VIEW COMPARISON:  None. FINDINGS: Multifocal pulmonary infiltrates. Esophageal tube tip overlies the proximal stomach, side-port projects over the distal esophagus IMPRESSION: Side-port of the esophageal tube overlies the distal esophagus, suggest further advancement for more optimal positioning Electronically Signed   By: Donavan Foil M.D.   On: 12/22/2017 23:21     CBC Recent Labs  Lab 12/22/17 1956 12/23/17 0240 12/23/17 0535  WBC 10.3 7.5 7.2  HGB 10.8* 11.5* 11.3*  HCT 36.5* 34.8* 35.0*  PLT 276 249 277  MCV 101.2* 92.8 91.9  MCH 30.0 30.5 29.6  MCHC 29.6* 32.9 32.2  RDW 16.1* 14.5 14.2  LYMPHSABS 0.8*  --   --   MONOABS 0.1*  --   --   EOSABS 0.0  --   --   BASOSABS 0.0  --   --     Chemistries  Recent Labs  Lab 12/22/17 1956 12/23/17 0240 12/23/17 0535 12/23/17 1012  NA 129* 137 138 141  K 4.3 3.3* 3.7 3.0*  CL 95* 97* 101 105  CO2 9* 19* 25 29  GLUCOSE 937* 806* 634* 397*  BUN 93* 90* 91* 86*  CREATININE 3.12* 3.14* 2.89* 2.64*  CALCIUM 6.8* 6.7* 6.9* 6.9*  AST 42*  --   --   --   ALT 17  --   --   --   ALKPHOS 136*  --   --   --   BILITOT 1.8*  --   --   --    ------------------------------------------------------------------------------------------------------------------ estimated creatinine clearance is 23.2 mL/min (A) (by C-G  formula based on SCr of 2.64 mg/dL (H)). ------------------------------------------------------------------------------------------------------------------ No results for input(s): HGBA1C in the last 72 hours. ------------------------------------------------------------------------------------------------------------------ Recent Labs    12/23/17 1012  TRIG 368*   ------------------------------------------------------------------------------------------------------------------ No results for input(s): TSH, T4TOTAL, T3FREE, THYROIDAB in the last 72 hours.  Invalid input(s): FREET3 ------------------------------------------------------------------------------------------------------------------ No results for input(s): VITAMINB12, FOLATE, FERRITIN, TIBC, IRON, RETICCTPCT in the last 72 hours.  Coagulation profile No results for input(s): INR, PROTIME in the last 168 hours.  No results for input(s): DDIMER in the last 72 hours.  Cardiac Enzymes No results for input(s): CKMB, TROPONINI, MYOGLOBIN in the last 168 hours.  Invalid input(s): CK ------------------------------------------------------------------------------------------------------------------ Invalid input(s): [place]  Patient is a 32 year old admitted with  sepsis   Severe sepsis ([place]) -due to multifocal pneumonia as below.    Continue IV ceftriaxone   Acute respiratory failure with hypoxia -continue ventilator   DKA (diabetic ketoacidoses) ([place]) -continue insulin drip acidosis improved  Multifocal pneumonia -antibiotics as per pulmonary critical care   Influenza A -continue Tamiflu   Acute renal failure superimposed on stage 3 chronic kidney disease ([place]) -due to his sepsis, improved with IV fluid   Seizures ([place]) -continue Keppra      Code Status Orders  (From admission, onward)        Start     Ordered   12/23/17 0034  Full code  Continuous     12/23/17 0033    Code Status History     Date Active Date Inactive Code Status Order ID Comments User Context   11/28/2015 0059 11/30/2015 1346 Full Code [id number]  Lance Coon, MD Inpatient   06/02/2015 1002 06/04/2015 1455 Full Code [id number]  Gladstone Lighter, MD Inpatient           Consults intensivist  DVT Prophylaxis heparin  Lab Results  Component Value Date   PLT 277 12/23/2017     Time Spent in minutes   50mn Greater than 50% of time spent in care coordination and counseling patient regarding the condition and plan of care.   SDustin FlockM.D on 12/23/2017 at 3:18 PM  Between 7am to 6pm - Pager - [phone]  After 6pm go to [email or web address] - [employer]   Office  [phone]

## 2017-12-23 NOTE — Progress Notes (Signed)
Pharmacy Antibiotic Note  Maurice Davidson is a 32 y.o. male admitted on 12/22/2017 with sepsis.  Pharmacy has been consulted for ceftriaxone dosing. BCID shows S. Pneumoniae. Patient previously received vancomycin and cefepime, started on 4/8. Patient is currently requiring mechanical ventilation.   Plan: Will initiate ceftriaxone 2g IV Q24hr with next dose scheduled for 1800 on 4/9.   Height: 5\' 9"  (175.3 cm) Weight: 90 lb 13.3 oz (41.2 kg) IBW/kg (Calculated) : 70.7  Temp (24hrs), Avg:97.4 F (36.3 C), Min:92 F (33.3 C), Max:100.8 F (38.2 C)  Recent Labs  Lab 12/22/17 1956 12/23/17 0235 12/23/17 0240 12/23/17 0535 12/23/17 1012  WBC 10.3  --  7.5 7.2  --   CREATININE 3.12*  --  3.14* 2.89* 2.64*  LATICACIDVEN  --  1.4  --   --   --     Estimated Creatinine Clearance: 23.2 mL/min (A) (by C-G formula based on SCr of 2.64 mg/dL (H)).    Allergies  Allergen Reactions  . Benzodiazepines     Avoid prescribing  . Abilify [Aripiprazole] Rash    Antimicrobials this admission: Cefepime 4/08 >> 4/9 Vancomycin 4/08 x 1  Oseltamivir 4/09 >> 4/13\ Ceftriaxone 4/9 >>  Dose adjustments this admission: N/A  Microbiology results: 4/8 BCx: Streptococcus pneumoniae 4/4 4/9 MRSA PCR: positive  Thank you for allowing pharmacy to be a part of this patient's care.  Kathyrn Sheriff 12/23/2017 1:53 PM

## 2017-12-23 NOTE — Progress Notes (Signed)
Sat dropped to below 88 %  amued with 100% o2 , placed vent with  fio2 100% , tolerating well sat 99%

## 2017-12-23 NOTE — Progress Notes (Signed)
eLink Physician-Brief Progress Note Patient Name: Maurice Davidson DOB: 07/09/1985 MRN: [id number]   Date of Service  12/23/2017  HPI/Events of Note  32 yo male admitted with severe DKA, acidosis B/l pneumonia-resp failure,intubated. Already seen by PCCM. VSS.   eICU Interventions  No new orders.      Intervention Category Evaluation Type: New Patient Evaluation  Lysle Dingwall 12/23/2017, 12:42 AM

## 2017-12-23 NOTE — Progress Notes (Signed)
Pharmacy Electrolyte Monitoring Consult:  Pharmacy consulted to assist in monitoring and replacing electrolytes in this 32 y.o. male admitted on 12/22/2017 with Hyperglycemia   Labs:  Sodium (mmol/L)  Date Value  12/23/2017 142  10/21/2017 139  11/20/2013 135 (L)   Potassium (mmol/L)  Date Value  12/23/2017 2.7 (LL)  11/21/2013 3.5   Magnesium (mg/dL)  Date Value  12/23/2017 2.5 (H)  11/21/2013 1.9   Phosphorus (mg/dL)  Date Value  12/23/2017 <1.0 (LL)  09/28/2013 4.1   Calcium (mg/dL)  Date Value  12/23/2017 6.9 (L)   Calcium, Total (mg/dL)  Date Value  11/20/2013 8.7   Albumin (g/dL)  Date Value  12/23/2017 1.9 (L)  10/21/2017 4.2  11/20/2013 2.9 (L)    Plan: 1. Electrolytes: patient order potassium phosphate 37mmol IV x 1 and potassium 12mEq VT x 1. Will recheck all electrolytes with am labs.    2. Glucose: patient converted off insulin drip and started on Lantus 10 units and Q4hr SSI. Will continue to follow diabetes team recommendations. .  3. Constipation: will start senna/docusate 1 tab BID with am meds on 4/10.    Pharmacy will continue to monitor and adjust per consult.   Simpson,Michael L 12/23/2017 10:12 PM

## 2017-12-23 NOTE — Progress Notes (Signed)
Initial Nutrition Assessment  DOCUMENTATION CODES:   Severe malnutrition in context of chronic illness, Underweight  INTERVENTION:  Initiate Vital 1.5 at 20 mL/hr and advance by 10 mL/hr every 12 hours to goal rate of 40 mL/hr (960 mL goal daily volume) + Pro-Stat 30 mL daily per OGT. Provides 1540 kcal, 80 grams of protein, 730 mL H2O daily. With current propofol rate provides 1735 kcal daily.  Patient will receive a total of approximately 180 grams carbohydrates daily from tube feeding (7.5 grams/hour).  Provide liquid MVI daily per tube.  Monitor magnesium, potassium, and phosphorus Q12hrs for 4 occurences, MD to replete as needed, as pt is at risk for refeeding syndrome given severe malnutrition.  NUTRITION DIAGNOSIS:   Severe Malnutrition related to chronic illness(DM, gastroparesis, drug abuse) as evidenced by severe fat depletion, moderate muscle depletion, severe muscle depletion.  GOAL:   Provide needs based on ASPEN/SCCM guidelines  MONITOR:   Vent status, Labs, Weight trends, TF tolerance, I & O's  REASON FOR ASSESSMENT:   Ventilator    ASSESSMENT:   32 year old male with PMHx of anxiety, chronic pain, opioid dependence, drug-seeking behavior, seizure disorder, DM, gastroparesis, HTN who is admitted with severe respiratory failure from PNA requiring intubation evening of 4/8, positive for influenza A, acute renal failure, sepsis with severe acidosis, DKA, and encephalopathy.   Patient intubated and sedated. Father at bedside. He reports that for the past few days patient has been staying in bed and not eating well. He reports that patient typically eats well at meals and snacks between meals. Father reports he is noncompliant with diabetes management. He has had diabetes for approximately 20 years now.  Access: 18 Fr. OGT placed 4/8; now terminates in proximal stomach per repeat abdominal x-ray 4/9; 55 cm at corner of mouth  MAP: 67-92 mmHg  Patient is currently  intubated on ventilator support MV: 8.1 L/min Temp (24hrs), Avg:97.2 F (36.2 C), Min:92 F (33.3 C), Max:100.8 F (38.2 C)  Propofol: 7.4 ml/hr (195 kcal daily)  Medications reviewed and include: famotidine, Tamiflu, sertraline, NS @ 150 mL/hr, cefepime, fentanyl gtt, regular insulin gtt, propofol gtt.  Labs reviewed: CBG 259->600, Potassium 3, BUN 86, Creatinine 2.64, Triglycerides 368.  I/O: 900 mL UOP overnight  Discussed with RN and on rounds. Plan is to start tube feeds today.  NUTRITION - FOCUSED PHYSICAL EXAM:    Most Recent Value  Orbital Region  Moderate depletion  Upper Arm Region  Severe depletion  Thoracic and Lumbar Region  Severe depletion  Buccal Region  Unable to assess  Temple Region  Moderate depletion  Clavicle Bone Region  Moderate depletion  Clavicle and Acromion Bone Region  Severe depletion  Scapular Bone Region  Severe depletion  Dorsal Hand  Moderate depletion  Patellar Region  Severe depletion  Anterior Thigh Region  Severe depletion  Posterior Calf Region  Severe depletion  Edema (RD Assessment)  None  Hair  Reviewed  Eyes  Unable to assess  Mouth  Unable to assess  Skin  Reviewed  Nails  Reviewed     Diet Order:  Diet NPO time specified Seizure precautions  EDUCATION NEEDS:   Not appropriate for education at this time  Skin:  Skin Assessment: Reviewed RN Assessment(body covered in scratches and abrasions)  Last BM:  12/23/2017 - small type 6  Height:   Ht Readings from Last 1 Encounters:  12/23/17 5\' 9"  (1.753 m)    Weight:   Wt Readings from Last 1 Encounters:  12/23/17 90 lb 13.3 oz (41.2 kg)    Ideal Body Weight:  72.7 kg  BMI:  Body mass index is 13.41 kg/m.  Estimated Nutritional Needs:   Kcal:  0814 (PSU 2003b w/ MSJ 1350, Ve 8.1, Tmax 38.2)  Protein:  62-82 grams (1.5-2 grams/kg)  Fluid:  1.2-1.4 L/day (30-35 mL/kg)  Willey Blade, MS, RD, LDN Office: [phone] Pager: [phone] After Hours/Weekend  Pager: [phone]

## 2017-12-23 NOTE — Progress Notes (Signed)
[place] Progress Note Patient Name: Maurice Davidson DOB: 07/09/1985 MRN: [id number]   Date of Service  12/23/2017  HPI/Events of Note  Hypotension - BP = 85/42 with MAP = 55.   eICU Interventions  Will order: 1. Phenylephrine IV infusion. Titrate to MAP > 65.     Intervention Category Major Interventions: Hypotension - evaluation and management  Sommer,Steven Eugene 12/23/2017, 9:42 PM

## 2017-12-23 NOTE — Progress Notes (Signed)
[place] Progress Note Patient Name: Maurice Davidson DOB: 07/09/1985 MRN: [id number]   Date of Service  12/23/2017  HPI/Events of Note  ABG on 100%/PRVC 20/TV 400/P 5 = 6.90/45/333. Presently on a NaHCO3 IV infusion at 125 mL/hour.  eICU Interventions  Will order: 1. Increase PRVC rate to 30.  2. NaHCO3 200 meq IV now.  3. Repeat ABG at 2 AM.     Intervention Category Major Interventions: Acid-Base disturbance - evaluation and management;Respiratory failure - evaluation and management  Vanetta Rule Cornelia Copa 12/23/2017, 12:51 AM
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
## 2017-12-23 NOTE — Progress Notes (Signed)
Inpatient Diabetes Program Recommendations  AACE/ADA: New Consensus Statement on Inpatient Glycemic Control (2015)  Target Ranges:  Prepandial:   less than 140 mg/dL      Peak postprandial:   less than 180 mg/dL (1-2 hours)      Critically ill patients:  140 - 180 mg/dL   Results for JAVONNE, DORKO (MRN [id number]) as of 12/23/2017 08:15  Ref. Range 12/22/2017 19:51 12/23/2017 01:35 12/23/2017 02:35 12/23/2017 03:30 12/23/2017 04:24 12/23/2017 05:28 12/23/2017 06:25  Glucose-Capillary Latest Ref Range: 65 - 99 mg/dL >600 (HH) >600 (HH) >600 (HH) >600 (HH) 594 (HH) 572 (HH) 515 (HH)   Results for LAURIE, LOVEJOY (MRN [id number]) as of 12/23/2017 08:15  Ref. Range 06/02/2015 09:16 06/19/2017 12:28 10/23/2017 09:10  Hemoglobin A1C Unknown 9.4 (H) 9.3 9.1  Results for DAMICO, PARTIN (MRN [id number]) as of 12/23/2017 08:15  Ref. Range 12/22/2017 19:56  Beta-Hydroxybutyric Acid Latest Ref Range: 0.05 - 0.27 mmol/L >8.00 (H)  Glucose Latest Ref Range: 65 - 99 mg/dL 937 (HH)    Review of Glycemic Control  Diabetes history: DM1 (makes no insulin; requires basal, correction, and meal coverage) Outpatient Diabetes medications: Lantus 10 units QHS, Novolog 5 units TID with meals plus additional 1 unit for every 12 grams of carbs Current orders for Inpatient glycemic control: IV insulin drip per DKA protocol  Inpatient Diabetes Program Recommendations:  Insulin - IV drip/GlucoStabilizer: Initial glucose 937 mg/dl on 12/22/17 and patient remains on IV insulin drip with current glucose of 486 mg/dl at 7:36 am.  Patient continues to require IV insulin at this time. Insulin - Basal: At time of transition from IV to SQ insulin, patient will require basal insulin. Recommend starting with Lantus 10 units Q24H (based on 41 kg x 0.25 units) Correction (SSI): At time of transition from IV to SQ insulin, please consider ordering CBGs with Novolog 0-9 units Q4H. HgbA1C: A1C 9.1% on 10/23/17 indicating an average glucose of 214 mg/dl over  the past 2-3 months.  Outpatient Referral: Per PCP note on 10/21/17, patient missed initial appointment with Dr. Honor Junes (Endocrinologist) on 10/16/17 and patient did not reschedule. Patient needs to establish care with Dr. Honor Junes for assistance with improve DM control.  Thanks, Barnie Alderman, RN, MSN, CDE Diabetes Coordinator Inpatient Diabetes Program [phone] (Team Pager from 8am to 5pm)

## 2017-12-23 NOTE — Progress Notes (Signed)
[facility] Progress Note Patient Name: Maurice Davidson DOB: 07/09/1985 MRN: [id number]   Date of Service  12/23/2017  HPI/Events of Note  ABG on 100%/PRVC 30/TV 400/P 5 = 7.16/54/430/19.7.  eICU Interventions  Will order: 1. Decrease NaHCO3 IV infusion to 75 mL/hour. 2. Increase PRVC rate to 35. 3. Repeat ABG at 5 AM.      Intervention Category Major Interventions: Acid-Base disturbance - evaluation and management;Respiratory failure - evaluation and management  Sommer,Steven Eugene 12/23/2017, 2:21 AM

## 2017-12-23 NOTE — Progress Notes (Signed)
[facility] Progress Note Patient Name: Maurice Davidson DOB: 07/09/1985 MRN: [id number]   Date of Service  12/23/2017  HPI/Events of Note  Hypotension - BP = 87/46 and HR = 88. No CVL.  eICU Interventions  Will order: 1. Phenylephrine IV infusion. Titrate to MAP > 65.     Intervention Category Major Interventions: Hypotension - evaluation and management  Sommer,Steven Eugene 12/23/2017, 1:05 AM

## 2017-12-23 NOTE — Progress Notes (Signed)
PHARMACY - PHYSICIAN COMMUNICATION CRITICAL VALUE ALERT - BLOOD CULTURE IDENTIFICATION (BCID)  Maurice Davidson is an 32 y.o. male who presented to [facility] on 12/22/2017.   Assessment:  32 yo male admitted to ICU with seizures and DKA. Patient in influenza A positive, MRSA PCR positive, and has positive blood cultures for streptococcus pneumoniae. Patient is currently requiring mechanical ventilation.   Name of physician (or Provider) Contacted: Kasa   Current antibiotics: cefepime and vancomycin   Changes to prescribed antibiotics recommended:  Ceftriaxone 2g IV Q24hr.   Results for orders placed or performed during the hospital encounter of 12/22/17  Blood Culture ID Panel (Reflexed) (Collected: 12/22/2017  9:31 PM)  Result Value Ref Range   Enterococcus species NOT DETECTED NOT DETECTED   Listeria monocytogenes NOT DETECTED NOT DETECTED   Staphylococcus species NOT DETECTED NOT DETECTED   Staphylococcus aureus NOT DETECTED NOT DETECTED   Streptococcus species DETECTED (A) NOT DETECTED   Streptococcus agalactiae NOT DETECTED NOT DETECTED   Streptococcus pneumoniae DETECTED (A) NOT DETECTED   Streptococcus pyogenes NOT DETECTED NOT DETECTED   Acinetobacter baumannii NOT DETECTED NOT DETECTED   Enterobacteriaceae species NOT DETECTED NOT DETECTED   Enterobacter cloacae complex NOT DETECTED NOT DETECTED   Escherichia coli NOT DETECTED NOT DETECTED   Klebsiella oxytoca NOT DETECTED NOT DETECTED   Klebsiella pneumoniae NOT DETECTED NOT DETECTED   Proteus species NOT DETECTED NOT DETECTED   Serratia marcescens NOT DETECTED NOT DETECTED   Haemophilus influenzae NOT DETECTED NOT DETECTED   Neisseria meningitidis NOT DETECTED NOT DETECTED   Pseudomonas aeruginosa NOT DETECTED NOT DETECTED   Candida albicans NOT DETECTED NOT DETECTED   Candida glabrata NOT DETECTED NOT DETECTED   Candida krusei NOT DETECTED NOT DETECTED   Candida parapsilosis NOT DETECTED NOT DETECTED   Candida  tropicalis NOT DETECTED NOT DETECTED    Maurice Davidson 12/23/2017  2:13 PM

## 2017-12-23 NOTE — Consult Note (Signed)
    Name: Maurice Davidson MRN: [id number] DOB: 07/09/1985     CONSULTATION DATE: 12/22/2017  CHIEF COMPLAINT: resp failure  HISTORY OF PRESENT ILLNESS:  Remains intubated, obtunded +FLU A resp failure, on vent   REVIEW OF SYSTEMS:   Unable to obtain due to critical illness   VITAL SIGNS: Temp:  [92 F (33.3 C)-98.6 F (37 C)] 98.6 F (37 C) (04/09 0330) Pulse Rate:  [84-119] 119 (04/09 0330) Resp:  [20-35] 35 (04/09 0330) BP: (86-119)/(44-80) 105/66 (04/09 0330) SpO2:  [87 %-100 %] 100 % (04/09 0330) FiO2 (%):  [50 %-100 %] 70 % (04/09 0224) Weight:  [90 lb 13.3 oz (41.2 kg)-98 lb 1.7 oz (44.5 kg)] 90 lb 13.3 oz (41.2 kg) (04/09 0245)  Physical Examination:  GENERAL:critically ill appearing, +resp distress HEAD: Normocephalic, atraumatic.  EYES: Pupils equal, round, reactive to light.  No scleral icterus.  MOUTH: Moist mucosal membrane. NECK: Supple. No JVD.  PULMONARY: +rhonchi, +wheezing CARDIOVASCULAR: S1 and S2. Regular rate and rhythm. No murmurs, rubs, or gallops.  GASTROINTESTINAL: Soft, nontender, -distended. No masses. Positive bowel sounds. No hepatosplenomegaly.  MUSCULOSKELETAL: No swelling, clubbing, or edema.  NEUROLOGIC: obtunded SKIN:intact,warm,dry  I personally reviewed lab work that was obtained in last 24 hrs. CXR Independently reviewed-b/l infiltarates   ASSESSMENT / PLAN: 32 yo white male with severe resp failure from Multifocal pneumonia with acute renal failure and sepsis with severe acidosis and DKA complicated by encephalopathy  1.Respiratory Failure -continue Full MV support -continue Bronchodilator Therapy -Wean Fio2 and PEEP as tolerated   2.Sepsis -use vasopressors to keep MAP>65 -follow ABG and LA -follow up cultures -emperic ABX -IVF's   3.Renal Failure-most likely due to ATN -follow chem 7 -follow UO -continue Foley Catheter-assess need   4.DKA -insulin infusion  5.Neuro - intubated and sedated - minimal  sedation to achieve a RASS goal: -1   Critical Care Time devoted to patient care services described in this note is 41 minutes.   Overall, patient is critically ill, prognosis is guarded.  Patient with Multiorgan failure and at high risk for cardiac arrest and death.    Corrin Parker, M.D.  [facility]  Medical Director [place] Director [facility] Cardio-Pulmonary Department

## 2017-12-24 ENCOUNTER — Other Ambulatory Visit: Payer: Self-pay

## 2017-12-24 DIAGNOSIS — E1011 Type 1 diabetes mellitus with ketoacidosis with coma: Secondary | ICD-10-CM

## 2017-12-24 DIAGNOSIS — J690 Pneumonitis due to inhalation of food and vomit: Secondary | ICD-10-CM | POA: Diagnosis present

## 2017-12-24 DIAGNOSIS — J101 Influenza due to other identified influenza virus with other respiratory manifestations: Secondary | ICD-10-CM

## 2017-12-24 LAB — BASIC METABOLIC PANEL
ANION GAP: 8 (ref 5–15)
BUN: 80 mg/dL — ABNORMAL HIGH (ref 6–20)
CHLORIDE: 104 mmol/L (ref 101–111)
CO2: 28 mmol/L (ref 22–32)
Calcium: 7.1 mg/dL — ABNORMAL LOW (ref 8.9–10.3)
Creatinine, Ser: 2.7 mg/dL — ABNORMAL HIGH (ref 0.61–1.24)
GFR calc Af Amer: 34 mL/min — ABNORMAL LOW (ref >60)
GFR calc non Af Amer: 29 mL/min — ABNORMAL LOW (ref >60)
Glucose, Bld: 309 mg/dL — ABNORMAL HIGH (ref 65–99)
POTASSIUM: 3.6 mmol/L (ref 3.5–5.1)
Sodium: 140 mmol/L (ref 135–145)

## 2017-12-24 LAB — GLUCOSE, CAPILLARY
GLUCOSE-CAPILLARY: 135 mg/dL — AB (ref 65–99)
GLUCOSE-CAPILLARY: 136 mg/dL — AB (ref 65–99)
GLUCOSE-CAPILLARY: 208 mg/dL — AB (ref 65–99)
GLUCOSE-CAPILLARY: 35 mg/dL — AB (ref 65–99)
GLUCOSE-CAPILLARY: 85 mg/dL (ref 65–99)
Glucose-Capillary: 142 mg/dL — ABNORMAL HIGH (ref 65–99)
Glucose-Capillary: 19 mg/dL — CL (ref 65–99)
Glucose-Capillary: 286 mg/dL — ABNORMAL HIGH (ref 65–99)
Glucose-Capillary: 330 mg/dL — ABNORMAL HIGH (ref 65–99)

## 2017-12-24 LAB — PHOSPHORUS: PHOSPHORUS: 1.4 mg/dL — AB (ref 2.5–4.6)

## 2017-12-24 LAB — MAGNESIUM: Magnesium: 2.6 mg/dL — ABNORMAL HIGH (ref 1.7–2.4)

## 2017-12-24 MED ORDER — LACTATED RINGERS IV SOLN
INTRAVENOUS | Status: DC
  Administered 2017-12-24: 11:00:00 via INTRAVENOUS

## 2017-12-24 MED ORDER — LORAZEPAM 2 MG/ML IJ SOLN
0.5000 mg | INTRAMUSCULAR | Status: DC | PRN
  Administered 2017-12-24 – 2017-12-26 (×6): 1 mg via INTRAVENOUS
  Filled 2017-12-24 (×6): qty 1

## 2017-12-24 MED ORDER — BUDESONIDE 0.25 MG/2ML IN SUSP
0.2500 mg | Freq: Two times a day (BID) | RESPIRATORY_TRACT | Status: DC
  Administered 2017-12-24 – 2017-12-31 (×13): 0.25 mg via RESPIRATORY_TRACT
  Filled 2017-12-24 (×13): qty 2

## 2017-12-24 MED ORDER — DEXTROSE IN LACTATED RINGERS 5 % IV SOLN
INTRAVENOUS | Status: DC
  Administered 2017-12-24: 17:00:00 via INTRAVENOUS

## 2017-12-24 MED ORDER — MUPIROCIN 2 % EX OINT
1.0000 "application " | TOPICAL_OINTMENT | Freq: Two times a day (BID) | CUTANEOUS | Status: AC
  Administered 2017-12-24 – 2017-12-29 (×9): 1 via NASAL
  Filled 2017-12-24 (×2): qty 22

## 2017-12-24 MED ORDER — DEXTROSE 50 % IV SOLN
50.0000 mL | Freq: Once | INTRAVENOUS | Status: AC
  Administered 2017-12-24: 50 mL via INTRAVENOUS

## 2017-12-24 MED ORDER — DEXTROSE 50 % IV SOLN
INTRAVENOUS | Status: AC
  Filled 2017-12-24: qty 50

## 2017-12-24 MED ORDER — SODIUM GLYCEROPHOSPHATE 1 MMOLE/ML IV SOLN
20.0000 mmol | Freq: Once | INTRAVENOUS | Status: AC
  Administered 2017-12-24: 20 mmol via INTRAVENOUS
  Filled 2017-12-24: qty 20

## 2017-12-24 MED ORDER — NALOXONE HCL 0.4 MG/ML IJ SOLN
INTRAMUSCULAR | Status: AC
  Administered 2017-12-24: 0.4 mg via INTRAVENOUS
  Filled 2017-12-24: qty 1

## 2017-12-24 MED ORDER — OSELTAMIVIR PHOSPHATE 30 MG PO CAPS
30.0000 mg | ORAL_CAPSULE | Freq: Every day | ORAL | Status: DC
  Filled 2017-12-24: qty 1

## 2017-12-24 MED ORDER — SERTRALINE HCL 50 MG PO TABS: 100.0000 mg | ORAL_TABLET | Freq: Every day | ORAL | Status: DC

## 2017-12-24 MED ORDER — SERTRALINE HCL 50 MG PO TABS
100.0000 mg | ORAL_TABLET | Freq: Every day | ORAL | Status: DC
  Administered 2017-12-26 – 2017-12-30 (×5): 100 mg via ORAL
  Filled 2017-12-24 (×5): qty 2

## 2017-12-24 MED ORDER — GABAPENTIN 250 MG/5ML PO SOLN
800.0000 mg | Freq: Three times a day (TID) | ORAL | Status: DC
  Administered 2017-12-26 – 2017-12-27 (×4): 800 mg via ORAL
  Filled 2017-12-24 (×14): qty 16

## 2017-12-24 MED ORDER — LORAZEPAM 2 MG/ML IJ SOLN
INTRAMUSCULAR | Status: AC
  Filled 2017-12-24: qty 1

## 2017-12-24 MED ORDER — GABAPENTIN 250 MG/5ML PO SOLN: 800.0000 mg | Freq: Three times a day (TID) | ORAL | Status: DC

## 2017-12-24 MED ORDER — LORAZEPAM 2 MG/ML IJ SOLN
2.0000 mg | Freq: Once | INTRAMUSCULAR | Status: AC
  Administered 2017-12-24: 2 mg via INTRAVENOUS

## 2017-12-24 MED ORDER — NALOXONE HCL 0.4 MG/ML IJ SOLN
0.4000 mg | Freq: Once | INTRAMUSCULAR | Status: AC
  Administered 2017-12-24: 0.4 mg via INTRAVENOUS

## 2017-12-24 MED ORDER — IPRATROPIUM-ALBUTEROL 0.5-2.5 (3) MG/3ML IN SOLN
3.0000 mL | Freq: Four times a day (QID) | RESPIRATORY_TRACT | Status: DC
  Administered 2017-12-24 – 2017-12-31 (×26): 3 mL via RESPIRATORY_TRACT
  Filled 2017-12-24 (×26): qty 3

## 2017-12-24 MED ORDER — CHLORHEXIDINE GLUCONATE CLOTH 2 % EX PADS
6.0000 | MEDICATED_PAD | Freq: Every day | CUTANEOUS | Status: AC
  Administered 2017-12-25 – 2017-12-29 (×3): 6 via TOPICAL

## 2017-12-24 NOTE — Progress Notes (Signed)
[place] at [facility]                                                                                                                                                                                  Patient Demographics   Maurice Davidson, is a 32 y.o. male, DOB - 07/09/1985, QGB:[id number]  Admit date - 12/22/2017   Admitting Physician Lance Coon, MD  Outpatient Primary MD for the patient is Trinna Post, PA-C   LOS - 2  Subjective: Patient remains on the ventilator intubated, continue to have fever Blood cultures positive for strep pneumo   Review of Systems:   CONSTITUTIONAL: Intubated  Vitals:   Vitals:   12/24/17 1100 12/24/17 1115 12/24/17 1200 12/24/17 1300  BP: 125/78  (!) 113/58 126/69  Pulse: (!) 129  (!) 117 (!) 119  Resp: '16  11 10  ' Temp: 99.9 F (37.7 C)  99.9 F (37.7 C) 100 F (37.8 C)  TempSrc:      SpO2: 91% 90% 92% 94%  Weight:      Height:        Wt Readings from Last 3 Encounters:  12/24/17 45.2 kg (99 lb 10.4 oz)  10/21/17 44.5 kg (98 lb)  06/18/17 43.5 kg (96 lb)     Intake/Output Summary (Last 24 hours) at 12/24/2017 1518 Last data filed at 12/24/2017 1400 Gross per 24 hour  Intake 4552.4 ml  Output 1445 ml  Net 3107.4 ml    Physical Exam:   GENERAL: Critically ill HEAD, EYES, EARS, NOSE AND THROAT: Atraumatic, normocephalic. Extraocular muscles are intact. Pupils equal and reactive to light. Sclerae anicteric. No conjunctival injection. No oro-pharyngeal erythema.  NECK: Supple. There is no jugular venous distention. No bruits, no lymphadenopathy, no thyromegaly.  HEART: Regular rate and rhythm,. No murmurs, no rubs, no clicks.  LUNGS: ET tube in place, diminished breath sounds ABDOMEN: Soft, flat, nontender, nondistended. Has good bowel sounds. No hepatosplenomegaly appreciated.  EXTREMITIES: No evidence of any cyanosis, clubbing, or peripheral edema.  +2 pedal and radial pulses bilaterally.   NEUROLOGIC: Sedated SKIN: Moist and warm with no rashes appreciated.  Psych: Sedated LN: No inguinal LN enlargement    Antibiotics   Anti-infectives (From admission, onward)   Start     Dose/Rate Route Frequency Ordered Stop   12/25/17 1000  oseltamivir (TAMIFLU) capsule 30 mg     30 mg Oral Daily 12/24/17 1016 12/27/17 0959   12/24/17 1000  vancomycin (VANCOCIN) 500 mg in sodium chloride 0.9 % 100 mL IVPB  Status:  Discontinued     500 mg 100 mL/hr over 60 Minutes Intravenous Every 24 hours 12/22/17 2357 12/23/17  0920   12/23/17 2100  ceFEPIme (MAXIPIME) 1 g in sodium chloride 0.9 % 100 mL IVPB  Status:  Discontinued     1 g 200 mL/hr over 30 Minutes Intravenous Every 24 hours 12/22/17 2357 12/23/17 1412   12/23/17 1800  cefTRIAXone (ROCEPHIN) 2 g in sodium chloride 0.9 % 100 mL IVPB     2 g 200 mL/hr over 30 Minutes Intravenous Daily-1800 12/23/17 1413     12/22/17 2345  oseltamivir (TAMIFLU) capsule 30 mg  Status:  Discontinued     30 mg Per Tube Daily 12/22/17 2305 12/24/17 1016   12/22/17 2130  ceFEPIme (MAXIPIME) 2 g in sodium chloride 0.9 % 100 mL IVPB     2 g 200 mL/hr over 30 Minutes Intravenous  Once 12/22/17 2127 12/22/17 2246   12/22/17 2115  piperacillin-tazobactam (ZOSYN) IVPB 3.375 g  Status:  Discontinued     3.375 g 100 mL/hr over 30 Minutes Intravenous  Once 12/22/17 2114 12/22/17 2118   12/22/17 2115  vancomycin (VANCOCIN) IVPB 1000 mg/200 mL premix     1,000 mg 200 mL/hr over 60 Minutes Intravenous  Once 12/22/17 2114 12/22/17 2308      Medications   Scheduled Meds: . budesonide (PULMICORT) nebulizer solution  0.25 mg Nebulization BID  . chlorhexidine gluconate (MEDLINE KIT)  15 mL Mouth Rinse BID  . gabapentin  800 mg Oral TID  . heparin  5,000 Units Subcutaneous Q8H  . insulin aspart  0-9 Units Subcutaneous Q4H  . insulin glargine  10 Units Subcutaneous Q24H  . ipratropium-albuterol  3 mL Nebulization Q6H  . mouth rinse  15 mL Mouth Rinse 10 times  per day  . multivitamin  15 mL Per Tube Daily  . [START ON 12/25/2017] oseltamivir  30 mg Oral Daily  . senna-docusate  1 tablet Per Tube BID  . [START ON 12/25/2017] sertraline  100 mg Oral Daily   Continuous Infusions: . sodium chloride    . cefTRIAXone (ROCEPHIN)  IV 2 g (12/23/17 1800)  . lactated ringers 50 mL/hr at 12/24/17 1400  . phenylephrine (NEO-SYNEPHRINE) Adult infusion Stopped (12/24/17 1015)  . sodium glycerophosphate 0.9% NaCl IVPB 20 mmol (12/24/17 0832)   PRN Meds:.sodium chloride, acetaminophen, LORazepam, ondansetron (ZOFRAN) IV   Data Review:   Micro Results Recent Results (from the past 240 hour(s))  Blood Culture (routine x 2)     Status: Abnormal (Preliminary result)   Collection Time: 12/22/17  9:31 PM  Result Value Ref Range Status   Specimen Description   Final    BLOOD RIGHT ANTECUBITAL Performed at [facility], [address] [place], [place] [address]    Special Requests   Final    BOTTLES DRAWN AEROBIC AND ANAEROBIC Blood Culture results may not be optimal due to an excessive volume of blood received in culture bottles Performed at [facility], [address] [place], [place] [address]    Culture  Setup Time   Final    Organism ID to follow [place] CRITICAL RESULT CALLED TO, READ BACK BY AND VERIFIED WITH: LISA KLUTTZ AT 8270 12/23/17 [place] Performed at [facility], [place],  [address]    Culture STREPTOCOCCUS PNEUMONIAE (A)  Final   Report Status PENDING  Incomplete  Blood Culture (routine x 2)     Status: Abnormal (Preliminary result)   Collection Time: 12/22/17  9:31 PM  Result Value Ref Range Status   Specimen  Description   Final    BLOOD BLOOD LEFT FOREARM Performed at [facility], [address], [place], [place] [address]    Special Requests   Final    BOTTLES DRAWN AEROBIC AND ANAEROBIC Blood Culture adequate  volume Performed at [facility], [address] [place], [place] [address]    Culture  Setup Time   Final    GRAM POSITIVE COCCI IN BOTH AEROBIC AND ANAEROBIC BOTTLES CRITICAL VALUE NOTED.  VALUE IS CONSISTENT WITH PREVIOUSLY REPORTED AND CALLED VALUE. Performed at [facility], [address] [place], [place] [address]    Culture STREPTOCOCCUS PNEUMONIAE (A)  Final   Report Status PENDING  Incomplete  Blood Culture ID Panel (Reflexed)     Status: Abnormal   Collection Time: 12/22/17  9:31 PM  Result Value Ref Range Status   Enterococcus species NOT DETECTED NOT DETECTED Final   Listeria monocytogenes NOT DETECTED NOT DETECTED Final   Staphylococcus species NOT DETECTED NOT DETECTED Final   Staphylococcus aureus NOT DETECTED NOT DETECTED Final   Streptococcus species DETECTED (A) NOT DETECTED Final    Comment: CRITICAL RESULT CALLED TO, READ BACK BY AND VERIFIED WITH:  LISA KLUTTZ AT 0600 12/23/17 SDR    Streptococcus agalactiae NOT DETECTED NOT DETECTED Final   Streptococcus pneumoniae DETECTED (A) NOT DETECTED Final    Comment: CRITICAL RESULT CALLED TO, READ BACK BY AND VERIFIED WITH:  LISA KLUTTZ AT 4599 12/23/17 SDR    Streptococcus pyogenes NOT DETECTED NOT DETECTED Final   Acinetobacter baumannii NOT DETECTED NOT DETECTED Final   Enterobacteriaceae species NOT DETECTED NOT DETECTED Final   Enterobacter cloacae complex NOT DETECTED NOT DETECTED Final   Escherichia coli NOT DETECTED NOT DETECTED Final   Klebsiella oxytoca NOT DETECTED NOT DETECTED Final   Klebsiella pneumoniae NOT DETECTED NOT DETECTED Final   Proteus species NOT DETECTED NOT DETECTED Final   Serratia marcescens NOT DETECTED NOT DETECTED Final   Haemophilus influenzae NOT DETECTED NOT DETECTED Final   Neisseria meningitidis NOT DETECTED NOT DETECTED Final   Pseudomonas aeruginosa NOT DETECTED NOT DETECTED Final   Candida albicans NOT DETECTED NOT DETECTED Final   Candida glabrata NOT  DETECTED NOT DETECTED Final   Candida krusei NOT DETECTED NOT DETECTED Final   Candida parapsilosis NOT DETECTED NOT DETECTED Final   Candida tropicalis NOT DETECTED NOT DETECTED Final    Comment: Performed at [facility], [address] [place], [place] [address]  MRSA PCR Screening     Status: Abnormal   Collection Time: 12/23/17  8:47 AM  Result Value Ref Range Status   MRSA by PCR POSITIVE (A) NEGATIVE Final    Comment:        The GeneXpert MRSA Assay (FDA approved for NASAL specimens only), is one component of a comprehensive MRSA colonization surveillance program. It is not intended to diagnose MRSA infection nor to guide or monitor treatment for MRSA infections. RESULT CALLED TO, READ BACK BY AND VERIFIED WITH: C/ MYRA FLOWERS '@1020'  12/23/17 [facility] Performed at [facility], [address] [place], [place] [address]   Culture, respiratory (NON-Expectorated)     Status: None (Preliminary result)   Collection Time: 12/23/17 12:24 PM  Result Value Ref Range Status   Specimen Description   Final    SPUTUM Performed at [facility], [address] [place], [place] [address]    Special Requests   Final    NONE Performed at [facility], [address] [place], [place] [address]    Gram Stain   Final    ABUNDANT WBC PRESENT,BOTH PMN AND MONONUCLEAR FEW GRAM POSITIVE COCCI    Culture   Final    CULTURE REINCUBATED FOR BETTER GROWTH Performed at [facility], [place] [address], [place], [place] [address]    Report Status PENDING  Incomplete    Radiology Reports Dg Abd 1 View  Result Date: 12/23/2017 CLINICAL DATA:  Orogastric tube placement EXAM: ABDOMEN - 1 VIEW COMPARISON:  December 22, 2017 FINDINGS: Orogastric tube tip and side port in proximal stomach. Visualized bowel gas pattern is unremarkable. No evident bowel obstruction or free air. Endotracheal tube tip is 5.1 cm above the carina. No pneumothorax. Multiple foci of  airspace disease noted bilaterally. IMPRESSION: Orogastric tube tip and side port in proximal stomach. Visualized bowel gas pattern unremarkable. Multiple foci of airspace disease in the lungs. Electronically Signed   By: Lowella Grip III M.D.   On: 12/23/2017 10:58   Dg Chest Portable 1 View  Result Date: 12/22/2017 CLINICAL DATA:  ETT placement EXAM: PORTABLE CHEST 1 VIEW COMPARISON:  12/22/2017, 01/15/2016 FINDINGS: Endotracheal tube tip is about 4.6 cm superior to the carina. Esophageal tube side-port overlies the distal esophagus. Worsening right greater than left ground-glass opacity and airspace disease. Normal heart size. No pneumothorax. IMPRESSION: 1. Endotracheal tube tip about 4.6 cm superior to carina 2. Esophageal tube side port overlies distal esophagus, suggest further advancement for more optimal positioning 3. Worsening of multifocal airspace disease, right greater than left consistent with multifocal pneumonia Electronically Signed   By: Donavan Foil M.D.   On: 12/22/2017 23:21   Dg Chest Portable 1 View  Result Date: 12/22/2017 CLINICAL DATA:  Combative, agitation, hyperglycemia. History of diabetes, sepsis. EXAM: PORTABLE CHEST 1 VIEW COMPARISON:  Chest radiograph Jan 15, 2016 FINDINGS: Cardiomediastinal silhouette is normal. Multifocal consolidation bilateral lungs without pleural effusion. No pneumothorax. Soft tissue planes and included osseous structures are nonsuspicious. IMPRESSION: Multifocal pneumonia, potential septic emboli etiology. Electronically Signed   By: Elon Alas M.D.   On: 12/22/2017 20:45   Dg Abd Portable 1 View  Result Date: 12/22/2017 CLINICAL DATA:  NG tube EXAM: PORTABLE ABDOMEN - 1 VIEW COMPARISON:  None. FINDINGS: Multifocal pulmonary infiltrates. Esophageal tube tip overlies the proximal stomach, side-port projects over the distal esophagus IMPRESSION: Side-port of the esophageal tube overlies the distal esophagus, suggest further advancement for  more optimal positioning Electronically Signed   By: Donavan Foil M.D.   On: 12/22/2017 23:21     CBC Recent Labs  Lab 12/22/17 1956 12/23/17 0240 12/23/17 0535  WBC 10.3 7.5 7.2  HGB 10.8* 11.5* 11.3*  HCT 36.5* 34.8* 35.0*  PLT 276 249 277  MCV 101.2* 92.8 91.9  MCH 30.0 30.5 29.6  MCHC 29.6* 32.9 32.2  RDW 16.1* 14.5 14.2  LYMPHSABS 0.8*  --   --   MONOABS 0.1*  --   --   EOSABS 0.0  --   --   BASOSABS 0.0  --   --     Chemistries  Recent Labs  Lab 12/22/17 1956  12/23/17 0535 12/23/17 1012 12/23/17 1355 12/23/17 1653 12/24/17 0511  NA 129*   < > 138 141 143 142 140  K 4.3   < > 3.7 3.0* 3.2* 2.7* 3.6  CL 95*   < > 101 105 107 106 104  CO2 9*   < > '25 29 29 28 28  ' GLUCOSE 937*   < >  634* 397* 239* 204* 309*  BUN 93*   < > 91* 86* 85* 84* 80*  CREATININE 3.12*   < > 2.89* 2.64* 2.68* 2.68* 2.70*  CALCIUM 6.8*   < > 6.9* 6.9* 7.0* 6.9* 7.1*  MG  --   --   --   --  2.6* 2.5* 2.6*  AST 42*  --   --   --   --   --   --   ALT 17  --   --   --   --   --   --   ALKPHOS 136*  --   --   --   --   --   --   BILITOT 1.8*  --   --   --   --   --   --    < > = values in this interval not displayed.   ------------------------------------------------------------------------------------------------------------------ estimated creatinine clearance is 24.9 mL/min (A) (by C-G formula based on SCr of 2.7 mg/dL (H)). ------------------------------------------------------------------------------------------------------------------ No results for input(s): HGBA1C in the last 72 hours. ------------------------------------------------------------------------------------------------------------------ Recent Labs    12/23/17 1012  TRIG 368*   ------------------------------------------------------------------------------------------------------------------ No results for input(s): TSH, T4TOTAL, T3FREE, THYROIDAB in the last 72 hours.  Invalid input(s):  FREET3 ------------------------------------------------------------------------------------------------------------------ No results for input(s): VITAMINB12, FOLATE, FERRITIN, TIBC, IRON, RETICCTPCT in the last 72 hours.  Coagulation profile No results for input(s): INR, PROTIME in the last 168 hours.  No results for input(s): DDIMER in the last 72 hours.  Cardiac Enzymes No results for input(s): CKMB, TROPONINI, MYOGLOBIN in the last 168 hours.  Invalid input(s): CK ------------------------------------------------------------------------------------------------------------------ Invalid input(s): [place]  Patient is a 32 year old admitted with sepsis   Severe sepsis ([place]) -due to multifocal pneumonia as well as strep pneumo sepsis the blood   Continue IV ceftriaxone   Acute respiratory failure with hypoxia -continue ventilator   DKA (diabetic ketoacidoses) ([place]) -resolved continue Lantus and sliding scale insulin  Multifocal pneumonia -antibiotics as per pulmonary critical care   Influenza A -continue Tamiflu   Acute renal failure superimposed on stage 3 chronic kidney disease ([place]) -due to his sepsis, improved with IV fluid   Seizures ([place]) -continue Keppra      Code Status Orders  (From admission, onward)        Start     Ordered   12/23/17 0034  Full code  Continuous     12/23/17 0033    Code Status History    Date Active Date Inactive Code Status Order ID Comments User Context   11/28/2015 0059 11/30/2015 1346 Full Code [id number]  Lance Coon, MD Inpatient   06/02/2015 1002 06/04/2015 1455 Full Code [id number]  Gladstone Lighter, MD Inpatient           Consults intensivist  DVT Prophylaxis heparin  Lab Results  Component Value Date   PLT 277 12/23/2017     Time Spent in minutes   35mn Greater than 50% of time spent in care coordination and counseling patient regarding the condition and plan of care.   SDustin FlockM.D on  12/24/2017 at 3:18 PM  Between 7am to 6pm - Pager - [phone]  After 6pm go to [email or web address] - [employer]   Office  [phone]

## 2017-12-24 NOTE — Progress Notes (Signed)
PULMONARY / CRITICAL CARE MEDICINE   Name: Maurice Davidson MRN: [id number] DOB: 07/09/1985    ADMISSION DATE:  12/22/2017  PT PROFILE: 67 M with DM I adm 04/08 via ED with AMS, combativeness, DKA. Intubated in ED. Flu PCR positive and BC positive for S pneumoniae  MAJOR EVENTS/TEST RESULTS: 04/08 As above. Treated with DKA protocol. Stabilized on vent. PAD protocol  INDWELLING DEVICES:: ETT 04/08 >> 04/10  MICRO DATA: MRSA PCR 04/08 >> POS Resp 04/09 >>  Blood 04/08 >> S pneumoniae  ANTIMICROBIALS:   Vanc 04/08 >> 04/09  Pip-tazo 04/08 X 1  Oseltamivir 04/08 >>  Ceftriaxine 04/08 >>   SUBJECTIVE:  Initially was very lethargic this morning on sedative infusions.  I initiated a spontaneous breathing trial and he was pulling supranormal tidal volumes.  Sedatives were discontinued.  He subsequently became agitated and was extubated.  Post extubation, he had some problems with hypoxemia requiring HFNC.  He has been checked on several occasions throughout the day and his oxygen requirements are improving.  His cognition is improving.  His cough is strong.  VITAL SIGNS: BP 126/69   Pulse (!) 119   Temp 100 F (37.8 C)   Resp 10   Ht 5\' 9"  (1.753 m)   Wt 99 lb 10.4 oz (45.2 kg)   SpO2 94%   BMI 14.72 kg/m   HEMODYNAMICS:    VENTILATOR SETTINGS: Vent Mode: PSV FiO2 (%):  [40 %-50 %] 50 % Set Rate:  [35 bmp] 35 bmp Vt Set:  [400 mL] 400 mL PEEP:  [5 cmH20] 5 cmH20 Pressure Support:  [0 cmH20] 0 cmH20 Plateau Pressure:  [20 cmH20] 20 cmH20  INTAKE / OUTPUT: I/O last 3 completed shifts: In: 9036.8 [I.V.:5800.1; NG/GT:276.7; IV Piggyback:2960] Out: 2170 [Urine:2170]  PHYSICAL EXAMINATION: General: Frail-appearing, still somewhat lethargic, no overt respiratory distress after extubation Neuro: No focal deficits HEENT: NCAT, sclerae white Cardiovascular: Tacky, regular, no M Lungs: Few scattered rhonchi bilaterally, no wheezes Abdomen: Soft, no palpable masses, +  BS Extremities: Warm, no edema  LABS:  BMET Recent Labs  Lab 12/23/17 1355 12/23/17 1653 12/24/17 0511  NA 143 142 140  K 3.2* 2.7* 3.6  CL 107 106 104  CO2 29 28 28   BUN 85* 84* 80*  CREATININE 2.68* 2.68* 2.70*  GLUCOSE 239* 204* 309*    Electrolytes Recent Labs  Lab 12/23/17 1355 12/23/17 1653 12/24/17 0511  CALCIUM 7.0* 6.9* 7.1*  MG 2.6* 2.5* 2.6*  PHOS <1.0* <1.0* 1.4*    CBC Recent Labs  Lab 12/22/17 1956 12/23/17 0240 12/23/17 0535  WBC 10.3 7.5 7.2  HGB 10.8* 11.5* 11.3*  HCT 36.5* 34.8* 35.0*  PLT 276 249 277    Coag's No results for input(s): APTT, INR in the last 168 hours.  Sepsis Markers Recent Labs  Lab 12/23/17 0235 12/23/17 0248  LATICACIDVEN 1.4  --   PROCALCITON  --  17.33    ABG Recent Labs  Lab 12/23/17 0031 12/23/17 0207 12/23/17 0532  PHART <6.900* 7.16* 7.33*  PCO2ART 45 54* 48  PO2ART 333* 430* 115*    Liver Enzymes Recent Labs  Lab 12/22/17 1956 12/23/17 2108  AST 42*  --   ALT 17  --   ALKPHOS 136*  --   BILITOT 1.8*  --   ALBUMIN 2.8* 1.9*    Cardiac Enzymes No results for input(s): TROPONINI, PROBNP in the last 168 hours.  Glucose Recent Labs  Lab 12/23/17 1716 12/23/17 2046 12/24/17 0056 12/24/17  0424 12/24/17 0745 12/24/17 1129  GLUCAP 195* 269* 330* 286* 208* 135*    CXR: RUL consolidation with patchy R>L infiltrates elsewhere       ASSESSMENT / PLAN: Acute hypoxemic respiratory failure Pneumococcal pneumonia Influenza with pneumonitis Agitated delirium DKA Chronic pain syndrome History of opioid abuse  Extubated under my supervision this morning Supplemental oxygen as needed to maintain SPO2 >90% Continue nebulized steroids and bronchodilators Monitor hemodynamics Monitor BMET intermittently Monitor I/Os Correct electrolytes as indicated  Maintenance IVF until able to take p.o.'s N.p.o. until cognition improves Monitor temp, WBC count Micro and abx as above  Cont  Lantus @ current dose Cont SSI - will change to ACHS when taking POs Cont gabapentin (presently @ reduced dose) Cont sertraline (presently @ reduced dose)    CCM time: 45 mins The above time includes time spent in consultation with patient and/or family members and reviewing care plan on multidisciplinary rounds. The above time includes multiple assessments before extubation and multiple assessments after extubation  Merton Border, MD PCCM service Mobile [phone] Pager [phone] 12/24/2017, 3:27 PM

## 2017-12-24 NOTE — Progress Notes (Signed)
Decreased FIO2 to 40%   

## 2017-12-24 NOTE — Progress Notes (Signed)
Patient extubated to 3l [place]. Initially sats were around 90% but dropped into the 80's. Patient was titrated up to 6l with saturations 90-91%. Patient was agitated and thrashing about bed. RN at bedside as well.

## 2017-12-24 NOTE — Progress Notes (Signed)
Pharmacy Antibiotic Note  Maurice Davidson is a 32 y.o. male admitted on 12/22/2017 with sepsis.  Pharmacy has been consulted for ceftriaxone dosing. BCID shows S. Pneumoniae. Patient previously received vancomycin and cefepime, started on 4/8. Patient is currently requiring mechanical ventilation. Patient ordered 5 day course of oseltamivir secondary to be influenza A positive.   Plan: Continue ceftriaxone 2g IV Q24hr.   Height: 5\' 9"  (175.3 cm) Weight: 99 lb 10.4 oz (45.2 kg) IBW/kg (Calculated) : 70.7  Temp (24hrs), Avg:98.9 F (37.2 C), Min:96.6 F (35.9 C), Max:101.8 F (38.8 C)  Recent Labs  Lab 12/22/17 1956 12/23/17 0235 12/23/17 0240 12/23/17 0535 12/23/17 1012 12/23/17 1355 12/23/17 1653 12/24/17 0511  WBC 10.3  --  7.5 7.2  --   --   --   --   CREATININE 3.12*  --  3.14* 2.89* 2.64* 2.68* 2.68* 2.70*  LATICACIDVEN  --  1.4  --   --   --   --   --   --     Estimated Creatinine Clearance: 24.9 mL/min (A) (by C-G formula based on SCr of 2.7 mg/dL (H)).    Allergies  Allergen Reactions  . Benzodiazepines     Avoid prescribing  . Abilify [Aripiprazole] Rash    Antimicrobials this admission: Cefepime 4/08 >> 4/9 Vancomycin 4/08 x 1  Oseltamivir 4/09 >> 4/13 Ceftriaxone 4/9 >>  Dose adjustments this admission: N/A  Microbiology results: 4/8 BCx: Streptococcus pneumoniae 4/4 4/9 MRSA PCR: positive  Thank you for allowing pharmacy to be a part of this patient's care.  Simpson,Michael L 12/24/2017 9:26 AM

## 2017-12-24 NOTE — Progress Notes (Signed)
Patient was given ativan by RN and saturations began to drop due to lethargy. Patient placed on 50% venturi mask. Sats still around 90%.
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
## 2017-12-24 NOTE — Progress Notes (Signed)
Writer contacted Dr Alva Garnet re decresing CBGs (currently 3) and patient not safe at this time to take POs.  Change fluids to D5 LR

## 2017-12-24 NOTE — Progress Notes (Addendum)
Inpatient Diabetes Program Recommendations  AACE/ADA: New Consensus Statement on Inpatient Glycemic Control (2015)  Target Ranges:  Prepandial:   less than 140 mg/dL      Peak postprandial:   less than 180 mg/dL (1-2 hours)      Critically ill patients:  140 - 180 mg/dL  Results for Maurice Davidson, Maurice Davidson (MRN [id number]) as of 12/24/2017 08:34  Ref. Range 12/24/2017 00:56 12/24/2017 04:24 12/24/2017 07:45  Glucose-Capillary Latest Ref Range: 65 - 99 mg/dL 330 (H) 286 (H) 208 (H)   Results for Maurice Davidson, Maurice Davidson (MRN [id number]) as of 12/24/2017 08:34  Ref. Range 12/23/2017 06:25 12/23/2017 07:33 12/23/2017 08:43 12/23/2017 09:40 12/23/2017 10:49 12/23/2017 11:39 12/23/2017 12:46 12/23/2017 14:01 12/23/2017 15:07 12/23/2017 16:12 12/23/2017 17:16 12/23/2017 20:46  Glucose-Capillary Latest Ref Range: 65 - 99 mg/dL 515 (HH) 486 (H) 461 (H) 394 (H) 360 (H) 312 (H) 259 (H) 235 (H) 202 (H) 208 (H) 195 (H) 269 (H)   Review of Glycemic Control  Diabetes history: DM1 (makes no insulin; requires basal, correction, and meal coverage) Outpatient Diabetes medications: Lantus 10 units QHS, Novolog 5 units TID with meals plus additional 1 unit for every 12 grams of carbs Current orders for Inpatient glycemic control: Lantus 10 units Q24H, Novolog 0-9 units Q4H  Inpatient Diabetes Program Recommendations:  Insulin - Basal: Please consider increasing Lantus to 15 units Q24H. Insulin-Tube Feeding Coverage:Please consider ordering Novolog 3 units Q4H for tube feeding coverage (in addition to Novolog correction scale). HgbA1C: A1C 9.1% on 10/23/17 indicating an average glucose of 214 mg/dl over the past 2-3 months.  Outpatient Referral: Per PCP note on 10/21/17, patient missed initial appointment with Dr. Honor Junes (Endocrinologist) on 10/16/17 and patient did not reschedule. Patient needs to establish care with Dr. Honor Junes for assistance with improve DM control.  Thanks, Barnie Alderman, RN, MSN, CDE Diabetes Coordinator Inpatient Diabetes  Program [phone] (Team Pager from 8am to 5pm)

## 2017-12-24 NOTE — Progress Notes (Signed)
Patients saturation fluctuating between 88-90, increase FIO2 to 50%.

## 2017-12-25 ENCOUNTER — Inpatient Hospital Stay: Payer: Self-pay

## 2017-12-25 ENCOUNTER — Inpatient Hospital Stay: Payer: Medicaid Other

## 2017-12-25 DIAGNOSIS — R401 Stupor: Secondary | ICD-10-CM

## 2017-12-25 DIAGNOSIS — E162 Hypoglycemia, unspecified: Secondary | ICD-10-CM

## 2017-12-25 DIAGNOSIS — J13 Pneumonia due to Streptococcus pneumoniae: Secondary | ICD-10-CM

## 2017-12-25 LAB — COMPREHENSIVE METABOLIC PANEL
ALT: 49 U/L (ref 17–63)
ANION GAP: 7 (ref 5–15)
AST: 104 U/L — ABNORMAL HIGH (ref 15–41)
Albumin: 2.1 g/dL — ABNORMAL LOW (ref 3.5–5.0)
Alkaline Phosphatase: 323 U/L — ABNORMAL HIGH (ref 38–126)
BILIRUBIN TOTAL: 0.6 mg/dL (ref 0.3–1.2)
BUN: 65 mg/dL — ABNORMAL HIGH (ref 6–20)
CO2: 31 mmol/L (ref 22–32)
Calcium: 7.6 mg/dL — ABNORMAL LOW (ref 8.9–10.3)
Chloride: 110 mmol/L (ref 101–111)
Creatinine, Ser: 2.12 mg/dL — ABNORMAL HIGH (ref 0.61–1.24)
GFR calc Af Amer: 46 mL/min — ABNORMAL LOW (ref >60)
GFR calc non Af Amer: 39 mL/min — ABNORMAL LOW (ref >60)
GLUCOSE: 104 mg/dL — AB (ref 65–99)
POTASSIUM: 2.8 mmol/L — AB (ref 3.5–5.1)
SODIUM: 148 mmol/L — AB (ref 135–145)
TOTAL PROTEIN: 6.1 g/dL — AB (ref 6.5–8.1)

## 2017-12-25 LAB — GLUCOSE, CAPILLARY
GLUCOSE-CAPILLARY: 163 mg/dL — AB (ref 65–99)
GLUCOSE-CAPILLARY: 91 mg/dL (ref 65–99)
GLUCOSE-CAPILLARY: 27 mg/dL — AB (ref 65–99)
GLUCOSE-CAPILLARY: 480 mg/dL — AB (ref 65–99)
GLUCOSE-CAPILLARY: 127 mg/dL — AB (ref 65–99)
Glucose-Capillary: 12 mg/dL — CL (ref 65–99)
Glucose-Capillary: 131 mg/dL — ABNORMAL HIGH (ref 65–99)
Glucose-Capillary: 48 mg/dL — ABNORMAL LOW (ref 65–99)
Glucose-Capillary: 79 mg/dL (ref 65–99)
Glucose-Capillary: 64 mg/dL — ABNORMAL LOW (ref 65–99)
Glucose-Capillary: <10 mg/dL — CL (ref 65–99)
Glucose-Capillary: 247 mg/dL — ABNORMAL HIGH (ref 65–99)
Glucose-Capillary: 48 mg/dL — ABNORMAL LOW (ref 65–99)
Glucose-Capillary: 101 mg/dL — ABNORMAL HIGH (ref 65–99)
Glucose-Capillary: 25 mg/dL — CL (ref 65–99)
Glucose-Capillary: 478 mg/dL — ABNORMAL HIGH (ref 65–99)
Glucose-Capillary: 108 mg/dL — ABNORMAL HIGH (ref 65–99)
Glucose-Capillary: 41 mg/dL — CL (ref 65–99)

## 2017-12-25 LAB — CULTURE, BLOOD (ROUTINE X 2): SPECIAL REQUESTS: ADEQUATE

## 2017-12-25 LAB — BASIC METABOLIC PANEL
Anion gap: 6 (ref 5–15)
BUN: 52 mg/dL — ABNORMAL HIGH (ref 6–20)
CALCIUM: 7.8 mg/dL — AB (ref 8.9–10.3)
CHLORIDE: 108 mmol/L (ref 101–111)
CO2: 33 mmol/L — ABNORMAL HIGH (ref 22–32)
CREATININE: 1.75 mg/dL — AB (ref 0.61–1.24)
GFR calc non Af Amer: 50 mL/min — ABNORMAL LOW (ref >60)
GFR, EST AFRICAN AMERICAN: 57 mL/min — AB (ref >60)
GLUCOSE: 31 mg/dL — AB (ref 65–99)
Potassium: 3.2 mmol/L — ABNORMAL LOW (ref 3.5–5.1)
Sodium: 147 mmol/L — ABNORMAL HIGH (ref 135–145)

## 2017-12-25 LAB — CBC
HCT: 27.6 % — ABNORMAL LOW (ref 40.0–52.0)
Hemoglobin: 9.3 g/dL — ABNORMAL LOW (ref 13.0–18.0)
MCH: 30.1 pg (ref 26.0–34.0)
MCHC: 33.8 g/dL (ref 32.0–36.0)
MCV: 89.1 fL (ref 80.0–100.0)
Platelets: 147 10*3/uL — ABNORMAL LOW (ref 150–440)
RBC: 3.1 MIL/uL — ABNORMAL LOW (ref 4.40–5.90)
RDW: 14 % (ref 11.5–14.5)
WBC: 14.2 10*3/uL — ABNORMAL HIGH (ref 3.8–10.6)

## 2017-12-25 LAB — PHOSPHORUS: PHOSPHORUS: 1.9 mg/dL — AB (ref 2.5–4.6)

## 2017-12-25 MED ORDER — POTASSIUM CHLORIDE 10 MEQ/50ML IV SOLN
10.0000 meq | INTRAVENOUS | Status: DC
  Administered 2017-12-25: 10 meq via INTRAVENOUS
  Filled 2017-12-25 (×4): qty 50

## 2017-12-25 MED ORDER — DEXAMETHASONE SODIUM PHOSPHATE 4 MG/ML IJ SOLN
2.0000 mg | Freq: Four times a day (QID) | INTRAMUSCULAR | Status: DC
  Administered 2017-12-25 – 2017-12-28 (×11): 2 mg via INTRAVENOUS
  Filled 2017-12-25: qty 0.5
  Filled 2017-12-25 (×7): qty 1
  Filled 2017-12-25 (×3): qty 0.5
  Filled 2017-12-25: qty 1
  Filled 2017-12-25: qty 0.5

## 2017-12-25 MED ORDER — DEXTROSE 50 % IV SOLN
INTRAVENOUS | Status: AC
  Filled 2017-12-25: qty 50

## 2017-12-25 MED ORDER — DEXTROSE 50 % IV SOLN
25.0000 g | INTRAVENOUS | Status: DC | PRN
  Administered 2017-12-25 – 2017-12-29 (×4): 25 g via INTRAVENOUS
  Filled 2017-12-25 (×4): qty 50

## 2017-12-25 MED ORDER — INSULIN GLARGINE 100 UNIT/ML ~~LOC~~ SOLN
5.0000 [IU] | Freq: Every day | SUBCUTANEOUS | Status: DC
  Administered 2017-12-25 – 2017-12-26 (×2): 5 [IU] via SUBCUTANEOUS
  Filled 2017-12-25 (×2): qty 0.05

## 2017-12-25 MED ORDER — DEXTROSE 50 % IV SOLN
INTRAVENOUS | Status: AC
  Administered 2017-12-25: 50 mL
  Filled 2017-12-25: qty 50

## 2017-12-25 MED ORDER — DEXTROSE 50 % IV SOLN
INTRAVENOUS | Status: AC
  Administered 2017-12-25: 19:00:00
  Filled 2017-12-25: qty 50

## 2017-12-25 MED ORDER — POTASSIUM CHLORIDE 10 MEQ/100ML IV SOLN
10.0000 meq | INTRAVENOUS | Status: AC
  Administered 2017-12-25 (×4): 10 meq via INTRAVENOUS
  Filled 2017-12-25 (×4): qty 100

## 2017-12-25 MED ORDER — VANCOMYCIN HCL IN DEXTROSE 1-5 GM/200ML-% IV SOLN
1000.0000 mg | Freq: Once | INTRAVENOUS | Status: AC
  Administered 2017-12-25: 1000 mg via INTRAVENOUS
  Filled 2017-12-25: qty 200

## 2017-12-25 MED ORDER — VANCOMYCIN HCL IN DEXTROSE 750-5 MG/150ML-% IV SOLN
750.0000 mg | INTRAVENOUS | Status: DC
  Administered 2017-12-26 – 2017-12-29 (×5): 750 mg via INTRAVENOUS
  Filled 2017-12-25 (×7): qty 150

## 2017-12-25 MED ORDER — POTASSIUM CHLORIDE 10 MEQ/50ML IV SOLN
10.0000 meq | INTRAVENOUS | Status: AC
  Administered 2017-12-25: 10 meq via INTRAVENOUS
  Filled 2017-12-25: qty 50

## 2017-12-25 MED ORDER — DEXTROSE 50 % IV SOLN
50.0000 mL | Freq: Once | INTRAVENOUS | Status: AC
  Administered 2017-12-25: 50 mL via INTRAVENOUS

## 2017-12-25 MED ORDER — KCL IN DEXTROSE-NACL 40-5-0.45 MEQ/L-%-% IV SOLN
INTRAVENOUS | Status: DC
  Administered 2017-12-25: 10:00:00 via INTRAVENOUS
  Filled 2017-12-25 (×2): qty 1000

## 2017-12-25 MED ORDER — SODIUM CHLORIDE 0.9% FLUSH: 10.0000 mL | INTRAVENOUS | Status: DC | PRN

## 2017-12-25 MED ORDER — DEXMEDETOMIDINE HCL IN NACL 400 MCG/100ML IV SOLN
0.0000 ug/kg/h | INTRAVENOUS | Status: DC
  Administered 2017-12-25: 0.4 ug/kg/h via INTRAVENOUS
  Administered 2017-12-25: 1.2 ug/kg/h via INTRAVENOUS
  Administered 2017-12-26: 0.8 ug/kg/h via INTRAVENOUS
  Filled 2017-12-25 (×3): qty 100

## 2017-12-25 MED ORDER — CHLORHEXIDINE GLUCONATE 0.12 % MT SOLN
OROMUCOSAL | Status: AC
  Administered 2017-12-25: 15 mL via OROMUCOSAL
  Filled 2017-12-25: qty 15

## 2017-12-25 MED ORDER — DEXTROSE 10 % IV SOLN
INTRAVENOUS | Status: DC
  Administered 2017-12-25: 50 mL/h via INTRAVENOUS

## 2017-12-25 MED ORDER — POTASSIUM PHOSPHATES 15 MMOLE/5ML IV SOLN
20.0000 mmol | Freq: Once | INTRAVENOUS | Status: AC
  Administered 2017-12-25: 20 mmol via INTRAVENOUS
  Filled 2017-12-25: qty 6.67

## 2017-12-25 MED ORDER — SODIUM CHLORIDE 0.9% FLUSH
10.0000 mL | Freq: Two times a day (BID) | INTRAVENOUS | Status: DC
  Administered 2017-12-25 – 2017-12-26 (×3): 10 mL
  Administered 2017-12-28: 30 mL
  Administered 2017-12-29 – 2017-12-31 (×5): 10 mL

## 2017-12-25 MED ORDER — SODIUM CHLORIDE 0.9 % IV SOLN
2.0000 g | Freq: Two times a day (BID) | INTRAVENOUS | Status: DC
  Administered 2017-12-25 – 2017-12-31 (×12): 2 g via INTRAVENOUS
  Filled 2017-12-25 (×13): qty 20

## 2017-12-25 MED ORDER — SODIUM CHLORIDE 4 MEQ/ML IV SOLN
INTRAVENOUS | Status: DC
  Administered 2017-12-25: 14:00:00 via INTRAVENOUS
  Filled 2017-12-25 (×3): qty 1000

## 2017-12-25 MED ORDER — PREMIER PROTEIN SHAKE: 11.0000 [oz_av] | Freq: Three times a day (TID) | ORAL | Status: DC

## 2017-12-25 NOTE — Progress Notes (Signed)
[place] [facility]                                                                                                                                                                                  Patient Demographics   Maurice Davidson, is a 32 y.o. male, DOB - 07/09/1985, JEH:[id number]  Admit date - 12/22/2017   Admitting Physician Lance Coon, MD  Outpatient Primary MD for the patient is Trinna Post, PA-C   LOS - 3  Subjective: Patient remains on the ventilator  Review of Systems:   CONSTITUTIONAL: Intubated  Vitals:   Vitals:   12/25/17 1500 12/25/17 1600 12/25/17 1700 12/25/17 1800  BP: 122/76 115/72 129/88 110/79  Pulse:  96 (!) 111 93  Resp: (!) '27 18 18 16  ' Temp:  99.7 F (37.6 C) 99 F (37.2 C) 99.5 F (37.5 C)  TempSrc:  Bladder    SpO2:  94% 92% 97%  Weight:      Height:        Wt Readings from Last 3 Encounters:  12/25/17 42.5 kg (93 lb 11.1 oz)  10/21/17 44.5 kg (98 lb)  06/18/17 43.5 kg (96 lb)     Intake/Output Summary (Last 24 hours) at 12/25/2017 1828 Last data filed at 12/25/2017 1800 Gross per 24 hour  Intake 993.45 ml  Output 4035 ml  Net -3041.55 ml    Physical Exam:   GENERAL: Critically ill HEAD, EYES, EARS, NOSE AND THROAT: Atraumatic, normocephalic. Extraocular muscles are intact. Pupils equal and reactive to light. Sclerae anicteric. No conjunctival injection. No oro-pharyngeal erythema.  NECK: Supple. There is no jugular venous distention. No bruits, no lymphadenopathy, no thyromegaly.  HEART: Regular rate and rhythm,. No murmurs, no rubs, no clicks.  LUNGS: ET tube in place, diminished breath sounds ABDOMEN: Soft, flat, nontender, nondistended. Has good bowel sounds. No hepatosplenomegaly appreciated.  EXTREMITIES: No evidence of any cyanosis, clubbing, or peripheral edema.  +2 pedal and radial pulses bilaterally.  NEUROLOGIC: Sedated SKIN: Moist and warm with no rashes appreciated.  Psych:  Sedated LN: No inguinal LN enlargement    Antibiotics   Anti-infectives (From admission, onward)   Start     Dose/Rate Route Frequency Ordered Stop   12/26/17 0100  vancomycin (VANCOCIN) IVPB 750 mg/150 ml premix     750 mg 150 mL/hr over 60 Minutes Intravenous Every 24 hours 12/25/17 1816     12/25/17 2200  cefTRIAXone (ROCEPHIN) 2 g in sodium chloride 0.9 % 100 mL IVPB     2 g 200 mL/hr over 30 Minutes Intravenous Every 12 hours 12/25/17 1629     12/25/17 1645  vancomycin (VANCOCIN) IVPB  1000 mg/200 mL premix     1,000 mg 200 mL/hr over 60 Minutes Intravenous  Once 12/25/17 1635     12/25/17 1000  oseltamivir (TAMIFLU) capsule 30 mg  Status:  Discontinued     30 mg Oral Daily 12/24/17 1016 12/25/17 1629   12/24/17 1000  vancomycin (VANCOCIN) 500 mg in sodium chloride 0.9 % 100 mL IVPB  Status:  Discontinued     500 mg 100 mL/hr over 60 Minutes Intravenous Every 24 hours 12/22/17 2357 12/23/17 0920   12/23/17 2100  ceFEPIme (MAXIPIME) 1 g in sodium chloride 0.9 % 100 mL IVPB  Status:  Discontinued     1 g 200 mL/hr over 30 Minutes Intravenous Every 24 hours 12/22/17 2357 12/23/17 1412   12/23/17 1800  cefTRIAXone (ROCEPHIN) 2 g in sodium chloride 0.9 % 100 mL IVPB  Status:  Discontinued     2 g 200 mL/hr over 30 Minutes Intravenous Daily-1800 12/23/17 1413 12/25/17 1629   12/22/17 2345  oseltamivir (TAMIFLU) capsule 30 mg  Status:  Discontinued     30 mg Per Tube Daily 12/22/17 2305 12/24/17 1016   12/22/17 2130  ceFEPIme (MAXIPIME) 2 g in sodium chloride 0.9 % 100 mL IVPB     2 g 200 mL/hr over 30 Minutes Intravenous  Once 12/22/17 2127 12/22/17 2246   12/22/17 2115  piperacillin-tazobactam (ZOSYN) IVPB 3.375 g  Status:  Discontinued     3.375 g 100 mL/hr over 30 Minutes Intravenous  Once 12/22/17 2114 12/22/17 2118   12/22/17 2115  vancomycin (VANCOCIN) IVPB 1000 mg/200 mL premix     1,000 mg 200 mL/hr over 60 Minutes Intravenous  Once 12/22/17 2114 12/22/17 2308       Medications   Scheduled Meds: . budesonide (PULMICORT) nebulizer solution  0.25 mg Nebulization BID  . chlorhexidine gluconate (MEDLINE KIT)  15 mL Mouth Rinse BID  . Chlorhexidine Gluconate Cloth  6 each Topical Q0600  . dexamethasone  2 mg Intravenous Q6H  . gabapentin  800 mg Oral TID  . heparin  5,000 Units Subcutaneous Q8H  . insulin glargine  5 Units Subcutaneous Daily  . ipratropium-albuterol  3 mL Nebulization Q6H  . mouth rinse  15 mL Mouth Rinse 10 times per day  . multivitamin  15 mL Per Tube Daily  . mupirocin ointment  1 application Nasal BID  . senna-docusate  1 tablet Per Tube BID  . sertraline  100 mg Oral Daily  . sodium chloride flush  10-40 mL Intracatheter Q12H   Continuous Infusions: . sodium chloride    . cefTRIAXone (ROCEPHIN)  IV    . dexmedetomidine (PRECEDEX) IV infusion 0.097 mcg/kg/hr (12/25/17 1800)  . KCL 40 mEq in dextrose 10%-sodium chloride 0.45% IV infusion 1000 mL 75 mL/hr at 12/25/17 1800  . vancomycin 1,000 mg (12/25/17 1801)  . [START ON 12/26/2017] vancomycin     PRN Meds:.sodium chloride, acetaminophen, LORazepam, ondansetron (ZOFRAN) IV, sodium chloride flush   Data Review:   Micro Results Recent Results (from the past 240 hour(s))  Blood Culture (routine x 2)     Status: Abnormal   Collection Time: 12/22/17  9:31 PM  Result Value Ref Range Status   Specimen Description   Final    BLOOD RIGHT ANTECUBITAL Performed at [facility], [address], [place], [place] [address]    Special Requests   Final    BOTTLES DRAWN AEROBIC AND ANAEROBIC Blood Culture results may not be optimal due to an excessive volume  of blood received in culture bottles Performed at [facility], [address] [place], [place] [address]    Culture  Setup Time   Final    Organism ID to follow [place] AND ANAEROBIC BOTTLES CRITICAL RESULT CALLED TO, READ BACK BY AND VERIFIED WITH: LISA KLUTTZ AT [id number]  12/23/17 SDR Performed at [facility], [address] [place], [place] [address]    Culture (A)  Final    STREPTOCOCCUS PNEUMONIAE SUSCEPTIBILITIES PERFORMED ON PREVIOUS CULTURE WITHIN THE LAST 5 DAYS. Performed at [facility], [place] [address], [place], [place] [address]    Report Status 12/25/2017 FINAL  Final  Blood Culture (routine x 2)     Status: Abnormal   Collection Time: 12/22/17  9:31 PM  Result Value Ref Range Status   Specimen Description   Final    BLOOD BLOOD LEFT FOREARM Performed at [facility], [address], [place], [place] [address]    Special Requests   Final    BOTTLES DRAWN AEROBIC AND ANAEROBIC Blood Culture adequate volume Performed at [facility], [address], [place], [place] [address]    Culture  Setup Time   Final    GRAM POSITIVE COCCI IN BOTH AEROBIC AND ANAEROBIC BOTTLES CRITICAL VALUE NOTED.  VALUE IS CONSISTENT WITH PREVIOUSLY REPORTED AND CALLED VALUE. Performed at [facility], [address] [place], [place] [address]    Culture STREPTOCOCCUS PNEUMONIAE (A)  Final   Report Status 12/25/2017 FINAL  Final   Organism ID, Bacteria STREPTOCOCCUS PNEUMONIAE  Final      Susceptibility   Streptococcus pneumoniae - MIC*    ERYTHROMYCIN >=8 RESISTANT Resistant     LEVOFLOXACIN 1 SENSITIVE Sensitive     PENICILLIN (meningitis) 1 RESISTANT Resistant     PENICILLIN (non-meningitis) 1 SENSITIVE Sensitive     CEFTRIAXONE (non-meningitis) 1 SENSITIVE Sensitive     CEFTRIAXONE (meningitis) 1 INTERMEDIATE Intermediate     * STREPTOCOCCUS PNEUMONIAE  Blood Culture ID Panel (Reflexed)     Status: Abnormal   Collection Time: 12/22/17  9:31 PM  Result Value Ref Range Status   Enterococcus species NOT DETECTED NOT DETECTED Final   Listeria monocytogenes NOT DETECTED NOT DETECTED Final   Staphylococcus species NOT DETECTED NOT DETECTED Final   Staphylococcus aureus NOT DETECTED NOT DETECTED Final    Streptococcus species DETECTED (A) NOT DETECTED Final    Comment: CRITICAL RESULT CALLED TO, READ BACK BY AND VERIFIED WITH:  LISA KLUTTZ AT 2952 12/23/17 SDR    Streptococcus agalactiae NOT DETECTED NOT DETECTED Final   Streptococcus pneumoniae DETECTED (A) NOT DETECTED Final    Comment: CRITICAL RESULT CALLED TO, READ BACK BY AND VERIFIED WITH:  LISA KLUTTZ AT 8413 12/23/17 SDR    Streptococcus pyogenes NOT DETECTED NOT DETECTED Final   Acinetobacter baumannii NOT DETECTED NOT DETECTED Final   Enterobacteriaceae species NOT DETECTED NOT DETECTED Final   Enterobacter cloacae complex NOT DETECTED NOT DETECTED Final   Escherichia coli NOT DETECTED NOT DETECTED Final   Klebsiella oxytoca NOT DETECTED NOT DETECTED Final   Klebsiella pneumoniae NOT DETECTED NOT DETECTED Final   Proteus species NOT DETECTED NOT DETECTED Final   Serratia marcescens NOT DETECTED NOT DETECTED Final   Haemophilus influenzae NOT DETECTED NOT DETECTED Final   Neisseria meningitidis NOT DETECTED NOT DETECTED Final   Pseudomonas aeruginosa NOT DETECTED NOT DETECTED Final   Candida albicans NOT DETECTED NOT DETECTED Final   Candida glabrata  NOT DETECTED NOT DETECTED Final   Candida krusei NOT DETECTED NOT DETECTED Final   Candida parapsilosis NOT DETECTED NOT DETECTED Final   Candida tropicalis NOT DETECTED NOT DETECTED Final    Comment: Performed at [facility], [address] [place], [place] [address]  MRSA PCR Screening     Status: Abnormal   Collection Time: 12/23/17  8:47 AM  Result Value Ref Range Status   MRSA by PCR POSITIVE (A) NEGATIVE Final    Comment:        The GeneXpert MRSA Assay (FDA approved for NASAL specimens only), is one component of a comprehensive MRSA colonization surveillance program. It is not intended to diagnose MRSA infection nor to guide or monitor treatment for MRSA infections. RESULT CALLED TO, READ BACK BY AND VERIFIED WITH: C/ MYRA FLOWERS '@1020'  12/23/17  [facility] Performed at [facility], [address] [place], [place] [address]   Culture, respiratory (NON-Expectorated)     Status: None (Preliminary result)   Collection Time: 12/23/17 12:24 PM  Result Value Ref Range Status   Specimen Description   Final    SPUTUM Performed at [facility], [address], [place], [place] [address]    Special Requests   Final    NONE Performed at [facility], [address] [place], [place] [address]    Gram Stain   Final    ABUNDANT WBC PRESENT,BOTH PMN AND MONONUCLEAR FEW GRAM POSITIVE COCCI    Culture   Final    CULTURE REINCUBATED FOR BETTER GROWTH Performed at [facility], [place] [address] [place], [place] [address]    Report Status PENDING  Incomplete    Radiology Reports Dg Abd 1 View  Result Date: 12/23/2017 CLINICAL DATA:  Orogastric tube placement EXAM: ABDOMEN - 1 VIEW COMPARISON:  December 22, 2017 FINDINGS: Orogastric tube tip and side port in proximal stomach. Visualized bowel gas pattern is unremarkable. No evident bowel obstruction or free air. Endotracheal tube tip is 5.1 cm above the carina. No pneumothorax. Multiple foci of airspace disease noted bilaterally. IMPRESSION: Orogastric tube tip and side port in proximal stomach. Visualized bowel gas pattern unremarkable. Multiple foci of airspace disease in the lungs. Electronically Signed   By: Lowella Grip III M.D.   On: 12/23/2017 10:58   Ct Head Wo Contrast  Result Date: 12/25/2017 CLINICAL DATA:  Altered mental status with diabetic ketoacidosis. EXAM: CT HEAD WITHOUT CONTRAST TECHNIQUE: Contiguous axial images were obtained from the base of the skull through the vertex without intravenous contrast. COMPARISON:  Jan 15, 2016 FINDINGS: Brain: The ventricles are normal in size and configuration. There is no intracranial mass, hemorrhage, extra-axial fluid collection, or midline shift. Gray-white compartments are normal. No acute infarct. Vascular:  No hyperdense vessel. No appreciable vascular calcification. Skull: The bony calvarium appears intact. There is mild soft tissue swelling over the right parietal region. No scalp hematoma evident. Sinuses/Orbits: There is opacification throughout multiple ethmoid air cells bilaterally. There is mild mucosal thickening in both inferior frontal sinuses. There is extensive mucosal thickening in the sphenoid sinus regions as well as in both maxillary antra. No air-fluid levels. Orbits appear symmetric bilaterally. Other: Mastoid air cells are clear. IMPRESSION: 1. No intracranial mass or hemorrhage. Gray-white compartments appear normal. 2.  Extensive multifocal paranasal sinus disease. 3. Soft tissue swelling over the right parietal bone. No well-defined scalp hematoma. Underlying bone appears normal. Electronically Signed   By: Lowella Grip III M.D.   On: 12/25/2017  15:23   Dg Chest Port 1 View  Result Date: 12/25/2017 CLINICAL DATA:  Respiratory failure EXAM: PORTABLE CHEST 1 VIEW COMPARISON:  Three days prior FINDINGS: Extubation with lower lung volumes. Extensive asymmetric airspace disease with interval worsening. No visible cavitation or effusion. Consolidation is particularly marked in the right upper lobe. IMPRESSION: 1. Progressive pneumonia since 3 days prior. 2. Lower volumes after extubation. Electronically Signed   By: Monte Fantasia M.D.   On: 12/25/2017 07:27   Dg Chest Portable 1 View  Result Date: 12/22/2017 CLINICAL DATA:  ETT placement EXAM: PORTABLE CHEST 1 VIEW COMPARISON:  12/22/2017, 01/15/2016 FINDINGS: Endotracheal tube tip is about 4.6 cm superior to the carina. Esophageal tube side-port overlies the distal esophagus. Worsening right greater than left ground-glass opacity and airspace disease. Normal heart size. No pneumothorax. IMPRESSION: 1. Endotracheal tube tip about 4.6 cm superior to carina 2. Esophageal tube side port overlies distal esophagus, suggest further advancement  for more optimal positioning 3. Worsening of multifocal airspace disease, right greater than left consistent with multifocal pneumonia Electronically Signed   By: Donavan Foil M.D.   On: 12/22/2017 23:21   Dg Chest Portable 1 View  Result Date: 12/22/2017 CLINICAL DATA:  Combative, agitation, hyperglycemia. History of diabetes, sepsis. EXAM: PORTABLE CHEST 1 VIEW COMPARISON:  Chest radiograph Jan 15, 2016 FINDINGS: Cardiomediastinal silhouette is normal. Multifocal consolidation bilateral lungs without pleural effusion. No pneumothorax. Soft tissue planes and included osseous structures are nonsuspicious. IMPRESSION: Multifocal pneumonia, potential septic emboli etiology. Electronically Signed   By: Elon Alas M.D.   On: 12/22/2017 20:45   Dg Abd Portable 1 View  Result Date: 12/22/2017 CLINICAL DATA:  NG tube EXAM: PORTABLE ABDOMEN - 1 VIEW COMPARISON:  None. FINDINGS: Multifocal pulmonary infiltrates. Esophageal tube tip overlies the proximal stomach, side-port projects over the distal esophagus IMPRESSION: Side-port of the esophageal tube overlies the distal esophagus, suggest further advancement for more optimal positioning Electronically Signed   By: Donavan Foil M.D.   On: 12/22/2017 23:21   [place] Ekg Site Rite  Result Date: 12/25/2017 If Site Rite image not attached, placement could not be confirmed due to current cardiac rhythm.    CBC Recent Labs  Lab 12/22/17 1956 12/23/17 0240 12/23/17 0535 12/25/17 0645  WBC 10.3 7.5 7.2 14.2*  HGB 10.8* 11.5* 11.3* 9.3*  HCT 36.5* 34.8* 35.0* 27.6*  PLT 276 249 277 147*  MCV 101.2* 92.8 91.9 89.1  MCH 30.0 30.5 29.6 30.1  MCHC 29.6* 32.9 32.2 33.8  RDW 16.1* 14.5 14.2 14.0  LYMPHSABS 0.8*  --   --   --   MONOABS 0.1*  --   --   --   EOSABS 0.0  --   --   --   BASOSABS 0.0  --   --   --     Chemistries  Recent Labs  Lab 12/22/17 1956  12/23/17 1012 12/23/17 1355 12/23/17 1653 12/24/17 0511 12/25/17 0645  NA 129*   < > 141  143 142 140 148*  K 4.3   < > 3.0* 3.2* 2.7* 3.6 2.8*  CL 95*   < > 105 107 106 104 110  CO2 9*   < > '29 29 28 28 31  ' GLUCOSE 937*   < > 397* 239* 204* 309* 104*  BUN 93*   < > 86* 85* 84* 80* 65*  CREATININE 3.12*   < > 2.64* 2.68* 2.68* 2.70* 2.12*  CALCIUM 6.8*   < > 6.9* 7.0* 6.9*  7.1* 7.6*  MG  --   --   --  2.6* 2.5* 2.6*  --   AST 42*  --   --   --   --   --  104*  ALT 17  --   --   --   --   --  49  ALKPHOS 136*  --   --   --   --   --  323*  BILITOT 1.8*  --   --   --   --   --  0.6   < > = values in this interval not displayed.   ------------------------------------------------------------------------------------------------------------------ estimated creatinine clearance is 29.8 mL/min (A) (by C-G formula based on SCr of 2.12 mg/dL (H)). ------------------------------------------------------------------------------------------------------------------ No results for input(s): HGBA1C in the last 72 hours. ------------------------------------------------------------------------------------------------------------------ Recent Labs    12/23/17 1012  TRIG 368*   ------------------------------------------------------------------------------------------------------------------ No results for input(s): TSH, T4TOTAL, T3FREE, THYROIDAB in the last 72 hours.  Invalid input(s): FREET3 ------------------------------------------------------------------------------------------------------------------ No results for input(s): VITAMINB12, FOLATE, FERRITIN, TIBC, IRON, RETICCTPCT in the last 72 hours.  Coagulation profile No results for input(s): INR, PROTIME in the last 168 hours.  No results for input(s): DDIMER in the last 72 hours.  Cardiac Enzymes No results for input(s): CKMB, TROPONINI, MYOGLOBIN in the last 168 hours.  Invalid input(s): CK ------------------------------------------------------------------------------------------------------------------ Invalid input(s):  Rea  Patient is a 32 year old admitted with sepsis   Severe sepsis ([place]) -due to multifocal pneumonia as well as strep pneumo sepsis in the blood   Continue IV ceftriaxone and vancomycin   Acute respiratory failure with hypoxia -continue ventilator   DKA (diabetic ketoacidoses) ([place]) -resolved continue Lantus and sliding scale insulin  Multifocal pneumonia -antibiotics as per pulmonary critical care   Influenza A -continue Tamiflu   Acute renal failure superimposed on stage 3 chronic kidney disease ([place]) -due to his sepsis, improved with IV fluid   Seizures ([place]) -continue Keppra      Code Status Orders  (From admission, onward)        Start     Ordered   12/23/17 0034  Full code  Continuous     12/23/17 0033    Code Status History    Date Active Date Inactive Code Status Order ID Comments User Context   11/28/2015 0059 11/30/2015 1346 Full Code [id number]  Lance Coon, MD Inpatient   06/02/2015 1002 06/04/2015 1455 Full Code [id number]  Gladstone Lighter, MD Inpatient           Consults intensivist  DVT Prophylaxis heparin  Lab Results  Component Value Date   PLT 147 (L) 12/25/2017     Time Spent in minutes   64mn Greater than 50% of time spent in care coordination and counseling patient regarding the condition and plan of care.   SDustin FlockM.D on 12/25/2017 at 6:28 PM  Between 7am to 6pm - Pager - [phone]  After 6pm go to [email or web address] - [employer]   Office  [phone]

## 2017-12-25 NOTE — Progress Notes (Signed)
Pt with two hypoglycemic episodes this shift, given 25 g dextrose IV each time.  CBG this afternoon 101.  Fluids changed to D10 1/2 NS w/ 40 K.  Lantus reduced.  5 units given this afternoon per Dr Alva Garnet d/t pt's vulnerability to DKA.  Pt was very lethargic this AM, precedex gtt stopped.  Head CT negative for any acute process.  Pt more awake following head CT.  Precedex restarted at 1 mcg.kg.hr while PICC line being placed d/t patient not able to be redirected.  Will stop gtt post PICC placement if appropriate.

## 2017-12-25 NOTE — Progress Notes (Signed)
Conflicting results from glucometer, 5 ccs drawn from PICC line x 3, infusions paused and line flushed prior, first few drops wasted each time.  Results from 27 to critical high.  Infusions paused again and PICC flushed w/ 40 ccs NS, Met B drawn to attempt to get accurate glucose count.  Tube sent to lab.

## 2017-12-25 NOTE — Progress Notes (Addendum)
PULMONARY / CRITICAL CARE MEDICINE   Name: Maurice Davidson MRN: [id number] DOB: 07/09/1985    ADMISSION DATE:  12/22/2017  PT PROFILE: 53 M with DM I adm 04/08 via ED with AMS, combativeness, DKA. Intubated in ED. Flu PCR positive and BC positive for S pneumoniae  MAJOR EVENTS/TEST RESULTS: 04/08 As above. Treated with DKA protocol. Stabilized on vent. PAD protocol 04/10 increasing agitation and delirium.  Dexmedetomidine infusion initiated.  Recurrent hypoglycemic episodes 04/11 severe lethargy.  Dexmedetomidine discontinued.  Lethargy persisted.  Continued to have recurrent episodes of hypoglycemia despite D10 infusion. Neurology consultation requested. ID consultation requested. Concern for meningitis > Ceftriaxone dose increased, vancomycin resumed, dexamethasone initiated 04/11 CT head: No acute intracranial findings  INDWELLING DEVICES:: ETT 04/08 >> 04/10 RUE PICC 04/11 >>   MICRO DATA: MRSA PCR 04/08 >> POS Blood 04/08 >> S pneumoniae Resp 04/09 >>    ANTIMICROBIALS:  Pip-tazo 04/08 X 1  Oseltamivir 04/08 >> 04/10 Vanc 04/08 >> 04/09, 04/11 >>  Ceftriaxine 04/08 >>    SUBJECTIVE:  Severe lethargy this AM.  Dexmedetomidine discontinued.  Lethargy persisted.  Continued to have recurrent episodes of hypoglycemia despite D10 infusion. Concern for meningitis > Ceftriaxone dose increased, vancomycin resumed, dexamethasone initiated  VITAL SIGNS: BP (!) 140/103   Pulse (!) 111   Temp 98.6 F (37 C)   Resp (!) 23   Ht 5\' 9"  (1.753 m)   Wt 93 lb 11.1 oz (42.5 kg)   SpO2 93%   BMI 13.84 kg/m   HEMODYNAMICS:    VENTILATOR SETTINGS: FiO2 (%):  [40 %-50 %] 40 %  INTAKE / OUTPUT: I/O last 3 completed shifts: In: 2550.7 [I.V.:1596.5; Other:10; NG/GT:494.2; IV Piggyback:450] Out: 3486 [Urine:3485; Stool:1]  PHYSICAL EXAMINATION: General: RASS -3, not F/C Neuro: Disconjugate gaze, withdraws all extremities, minimal spontaneous movement HEENT: NCAT, sclerae  white Cardiovascular: Tachy, regular, no M Lungs: Bilateral rhonchi, no wheezes Abdomen: Soft, no palpable masses, + BS Extremities: Warm, no edema  LABS:  BMET Recent Labs  Lab 12/23/17 1653 12/24/17 0511 12/25/17 0645  NA 142 140 148*  K 2.7* 3.6 2.8*  CL 106 104 110  CO2 28 28 31   BUN 84* 80* 65*  CREATININE 2.68* 2.70* 2.12*  GLUCOSE 204* 309* 104*    Electrolytes Recent Labs  Lab 12/23/17 1355 12/23/17 1653 12/24/17 0511 12/25/17 0645  CALCIUM 7.0* 6.9* 7.1* 7.6*  MG 2.6* 2.5* 2.6*  --   PHOS <1.0* <1.0* 1.4*  --     CBC Recent Labs  Lab 12/23/17 0240 12/23/17 0535 12/25/17 0645  WBC 7.5 7.2 14.2*  HGB 11.5* 11.3* 9.3*  HCT 34.8* 35.0* 27.6*  PLT 249 277 147*    Coag's No results for input(s): APTT, INR in the last 168 hours.  Sepsis Markers Recent Labs  Lab 12/23/17 0235 12/23/17 0248  LATICACIDVEN 1.4  --   PROCALCITON  --  17.33    ABG Recent Labs  Lab 12/23/17 0031 12/23/17 0207 12/23/17 0532  PHART <6.900* 7.16* 7.33*  PCO2ART 45 54* 48  PO2ART 333* 430* 115*    Liver Enzymes Recent Labs  Lab 12/22/17 1956 12/23/17 2108 12/25/17 0645  AST 42*  --  104*  ALT 17  --  49  ALKPHOS 136*  --  323*  BILITOT 1.8*  --  0.6  ALBUMIN 2.8* 1.9* 2.1*    Cardiac Enzymes No results for input(s): TROPONINI, PROBNP in the last 168 hours.  Glucose Recent Labs  Lab 12/25/17 1218 12/25/17  1223 12/25/17 1244 12/25/17 1247 12/25/17 1421 12/25/17 1601  GLUCAP <10* <10* 247* 91 48* 101*    CXR: Increased RUL consolidation increasing infiltrate in RLL, patchy opacities on L    ASSESSMENT / PLAN: Acute hypoxemic respiratory failure Pneumococcal pneumonia Pneumococcal bacteremia Influenza A + Concern for pneumococcal meningitis Agitated delirium, resolved Severe lethargy, stupor DKA, resolved Recurrent hypoglycemia Protein-calorie malnutrition Chronic pain syndrome History of opioid abuse  Cont supplemental oxygen as  needed to maintain SPO2 >90% Continue nebulized bronchodilators Monitor hemodynamics Monitor BMET intermittently Monitor I/Os Correct electrolytes as indicated  NPO until cognition improves Monitor temp, WBC count Micro and abx as above - Ceftriaxone dose increased, vancomycin resumed 04/11 Dexamethasone initiated 04/11 Cont Lantus - needs to remain on some basal insulin to prevent recurrent DKA Cont SSI  Cont maintenance IVFs with D10 until able to take POs If unable to take nutrition orally soon, will need to consider placement of NG tube for TFs Discussed with ID and Neurology, both of whm will see patient 04/12  Echocardiogram ordered   Father and mother updated @ bedside earlier in day  CCM time: 60 mins The above time includes time spent in consultation with patient and/or family members and reviewing care plan on multidisciplinary rounds. The above time includes multiple assessments before extubation and multiple assessments after extubation  Merton Border, MD PCCM service Mobile [phone] Pager [phone] 12/25/2017, 4:06 PM

## 2017-12-25 NOTE — Progress Notes (Signed)
Patient has had to receive D50 x4 this shift for low CBG readings. IV fluids have been adjusted via NP and is now on D10 @ 75/hr. Patient has been agitated, combative and unable to be redirected. Ativan given as ordered and PRN dose included and was ineffective for patient. Precedex drip started and increased to max rate of 1.34mcg/kg. PRN Ativan given once more at this time due to patient attempting to get out of bed. Safety maintained.

## 2017-12-25 NOTE — Progress Notes (Signed)
Peripherally Inserted Central Catheter/Midline Placement  The IV Nurse has discussed with the patient and/or persons authorized to consent for the patient, the purpose of this procedure and the potential benefits and risks involved with this procedure.  The benefits include less needle sticks, lab draws from the catheter, and the patient may be discharged home with the catheter. Risks include, but not limited to, infection, bleeding, blood clot (thrombus formation), and puncture of an artery; nerve damage and irregular heartbeat and possibility to perform a PICC exchange if needed/ordered by physician.  Alternatives to this procedure were also discussed.  Bard Power PICC patient education guide, fact sheet on infection prevention and patient information card has been provided to patient /or left at bedside.    PICC/Midline Placement Documentation  PICC Triple Lumen 12/45/80 PICC Right Basilic 36 cm 1 cm (Active)  Indication for Insertion or Continuance of Line Prolonged intravenous therapies 12/25/2017  5:40 PM  Exposed Catheter (cm) 1 cm 12/25/2017  5:40 PM  Site Assessment Clean;Dry;Intact 12/25/2017  5:40 PM  Lumen #1 Status Flushed;Saline locked;Blood return noted 12/25/2017  5:40 PM  Lumen #2 Status Flushed;Saline locked;Blood return noted 12/25/2017  5:40 PM  Lumen #3 Status Flushed;Saline locked;Blood return noted 12/25/2017  5:40 PM  Dressing Type Transparent;Securing device 12/25/2017  5:40 PM  Dressing Status Clean;Dry;Intact;Antimicrobial disc in place 12/25/2017  5:40 PM  Dressing Change Due 01/01/18 12/25/2017  5:40 PM       [name] 12/25/2017, 5:43 PM

## 2017-12-25 NOTE — Progress Notes (Signed)
Pharmacy Antibiotic Note  Maurice Davidson is a 32 y.o. male admitted on 12/22/2017 with bacteremia and concern for meningitis.  Pharmacy has been consulted for vancomycin dosing.  Plan: Vancomycin 1g IV once followed by 7 hour stack dose.  Will start vancomycin 750mg  IV q24h. Estimated Cmin=15. Will check first vancomycin trough prior to fourth dose.   Height: 5\' 9"  (175.3 cm) Weight: 93 lb 11.1 oz (42.5 kg) IBW/kg (Calculated) : 70.7  Temp (24hrs), Avg:97.7 F (36.5 C), Min:96.4 F (35.8 C), Max:98.8 F (37.1 C)  Recent Labs  Lab 12/22/17 1956 12/23/17 0235 12/23/17 0240 12/23/17 0535 12/23/17 1012 12/23/17 1355 12/23/17 1653 12/24/17 0511 12/25/17 0645  WBC 10.3  --  7.5 7.2  --   --   --   --  14.2*  CREATININE 3.12*  --  3.14* 2.89* 2.64* 2.68* 2.68* 2.70* 2.12*  LATICACIDVEN  --  1.4  --   --   --   --   --   --   --     Estimated Creatinine Clearance: 29.8 mL/min (A) (by C-G formula based on SCr of 2.12 mg/dL (H)).    Allergies  Allergen Reactions  . Benzodiazepines     Avoid prescribing. Not a true allergy  . Abilify [Aripiprazole] Rash    Antimicrobials this admission: 4/8 Cefepime >> once 4/8 Vancomycin >> once 4/9 Ceftriaxone >>  4/9 Oseltamivir >>  4/11 Vancomycin >>   Dose adjustments this admission:   Microbiology results: 4/8 BCx: Strep Pneumo  4/9 Sputum: reincubated 4/9  MRSA PCR: positive  Thank you for allowing pharmacy to be a part of this patient's care.  Candelaria Stagers, PharmD Pharmacy Resident 12/25/2017 4:42 PM

## 2017-12-25 NOTE — Progress Notes (Signed)
Pharmacy Antibiotic Note  Maurice Davidson is a 32 y.o. male admitted on 12/22/2017 with sepsis.  Pharmacy has been consulted for ceftriaxone dosing. BCID shows S. Pneumoniae. Patient previously received vancomycin and cefepime, started on 4/8. Patient is currently requiring mechanical ventilation. Patient ordered 5 day course of oseltamivir secondary to be influenza A positive.   Plan: Continue ceftriaxone 2g IV Q24hr.   Height: 5\' 9"  (175.3 cm) Weight: 93 lb 11.1 oz (42.5 kg) IBW/kg (Calculated) : 70.7  Temp (24hrs), Avg:97.7 F (36.5 C), Min:96.4 F (35.8 C), Max:98.8 F (37.1 C)  Recent Labs  Lab 12/22/17 1956 12/23/17 0235 12/23/17 0240 12/23/17 0535 12/23/17 1012 12/23/17 1355 12/23/17 1653 12/24/17 0511 12/25/17 0645  WBC 10.3  --  7.5 7.2  --   --   --   --  14.2*  CREATININE 3.12*  --  3.14* 2.89* 2.64* 2.68* 2.68* 2.70* 2.12*  LATICACIDVEN  --  1.4  --   --   --   --   --   --   --     Estimated Creatinine Clearance: 29.8 mL/min (A) (by C-G formula based on SCr of 2.12 mg/dL (H)).    Allergies  Allergen Reactions  . Benzodiazepines     Avoid prescribing. Not a true allergy  . Abilify [Aripiprazole] Rash    Antimicrobials this admission: Cefepime 4/08 >> 4/9 Vancomycin 4/08 x 1  Oseltamivir 4/09 >> 4/13 Ceftriaxone 4/9 >>  Dose adjustments this admission: N/A  Microbiology results: 4/8 BCx: Streptococcus pneumoniae 4/4 4/9 MRSA PCR: positive  Thank you for allowing pharmacy to be a part of this patient's care.  Labrisha Wuellner L 12/25/2017 3:12 PM

## 2017-12-25 NOTE — Progress Notes (Addendum)
Hypoglycemic Event  CBG: unreadable  Treatment: D50 IV 50 mL  Symptoms: None  Follow-up CBG: Time:1245 CBG Result:91  Possible Reasons for Event: Inadequate meal intake  Comments/MD notified:glucose readings very difficult, do not correlate with serum glucose, have requested a midline IV from IV Team. Per Dr Alva Garnet change maintenance fluid to D10 1/2 NS W/ 40 K at 75 ml/hr.  Lantus has been stopped    Maurice Davidson

## 2017-12-25 NOTE — Progress Notes (Signed)
Inpatient Diabetes Program Recommendations  AACE/ADA: New Consensus Statement on Inpatient Glycemic Control (2015)  Target Ranges:  Prepandial:   less than 140 mg/dL      Peak postprandial:   less than 180 mg/dL (1-2 hours)      Critically ill patients:  140 - 180 mg/dL   Results for GIBBS, NAUGLE (MRN [id number]) as of 12/25/2017 12:48  Ref. Range 12/25/2017 07:57 12/25/2017 12:05 12/25/2017 12:18 12/25/2017 12:23 12/25/2017 12:44  Glucose-Capillary Latest Ref Range: 65 - 99 mg/dL 64 (L) <10 (LL) <10 (LL) <10 (LL) 247 (H)  Results for GERAD, CORNELIO (MRN [id number]) as of 12/25/2017 12:48  Ref. Range 12/23/2017 10:12 12/23/2017 13:55 12/23/2017 16:53 12/24/2017 05:11 12/25/2017 06:45  Glucose Latest Ref Range: 65 - 99 mg/dL 397 (H) 239 (H) 204 (H) 309 (H) 104 (H)   Review of Glycemic Control  Diabetes history: DM1 (makes no insulin; requires basal, correction, and meal coverage) Outpatient Diabetes medications: Lantus 10 units QHS, Novolog 5 units TID with meals plus additional 1 unit for every 12 grams of carbs Current orders for Inpatient glycemic control: CBG monitoring; no insulin ordered  Inpatient Diabetes Program Recommendations:  Hypoglyemia: Patient has experienced recurrent hypoglycemia several times over the past 16 hours. Patient last received Lantus 10 units at 16:51 on 12/24/17 and Novolog last given 1 unit at 12:21 on 12/24/17.  Talked with Malachy Mood, RN regarding hypoglycemia and she reports there is question as to whether the finger sticks are correct or not. She reports that the glucose values <10 mg/dl at 12:18 was drawn from peripheral IV and patient will have a midline placed today to draw from.    Nutrition: Noted RD notes patient experienced refeeding syndrome with initiation of tube feeds. Patient is currently on D5-1/2 NS, is NPO, and may need NGT for enteral nutrition.   Inpatient Referral: If patient continues to experience hypoglycemia, recommend attending MD call Dr. Gabriel Carina  (Endocrinologist) to discuss patient and get recommendations.  HgbA1C: A1C 9.1% on 10/23/17 indicating an average glucose of 214 mg/dl over the past 2-3 months. Per PCP note on 10/21/17, patient missed initial appointment with Dr. Honor Junes (Endocrinologist) on 10/16/17 and patient did not reschedule. Patient needs to establish care with Dr. Honor Junes for assistance with improve DM control.  Thanks, Barnie Alderman, RN, MSN, CDE Diabetes Coordinator Inpatient Diabetes Program [phone] (Team Pager from 8am to 5pm)

## 2017-12-25 NOTE — Progress Notes (Signed)
BMP blood sugar 31 - 1 amp D50 administered.

## 2017-12-25 NOTE — Progress Notes (Signed)
Nutrition Follow-up  DOCUMENTATION CODES:   Severe malnutrition in context of chronic illness, Underweight  INTERVENTION:  Patient was experiencing refeeding syndrome with initiation of tube feeds. Phosphorus dropped <1 mg/dL and last level was only 1.4 mg/dL on 4/10. No level today. Recommend monitoring phosphorus, magnesium, and potassium daily and replacing until they are stable within normal limits.  If diet unable to be advanced recommend placing small-bore NGT for enteral nutrition by 4/13. Patient is severely malnourished and there is a lower threshold for initiation of nutrition support.  Recommend waiting to initiate tube feeds until phosphorus is at least 2 mg/dL.  If tube feeds are initiated recommend initiating Vital 1.5 at 15 mL/hr and advancing by 15 mL/hr every 8 hours to goal rate of 45 mL/hr (1080 mL goal daily volume). Provides 1620 kcal, 73 grams of protein, 821 mL H2O daily.  If patient remains on IV fluids recommend minimum free water flush of 30 mL Q4hrs to maintain tube patency. If IV fluids are discontinued recommend free water flush of 240 mL Q6hrs, which will provide a total of 1781 mL H2O daily including water in tube feeds.  NUTRITION DIAGNOSIS:   Severe Malnutrition related to chronic illness(DM, gastroparesis, drug abuse) as evidenced by severe fat depletion, moderate muscle depletion, severe muscle depletion.  Ongoing.  GOAL:   Patient will meet greater than or equal to 90% of their needs  Not met.  MONITOR:   Vent status, Labs, Weight trends, TF tolerance, I & O's  REASON FOR ASSESSMENT:   Ventilator    ASSESSMENT:   32 year old male with PMHx of anxiety, chronic pain, opioid dependence, drug-seeking behavior, seizure disorder, DM, gastroparesis, HTN who is admitted with severe respiratory failure from PNA requiring intubation evening of 4/8, positive for influenza A, acute renal failure, sepsis with severe acidosis, DKA, and encephalopathy.    -Patient was extubated on 4/10. OGT was removed.  Patient currently on HFNC. Per chart he experienced low CBGs overnight requiring D50 x4. Due to patient being agitated and combative he is now on Precedex gtt. Plan is to keep patient NPO until cognition improves. Patient hadn't even gotten to his goal tube feed rates prior to them being stopped for extubation. Patient was experiencing refeeding syndrome when his tube feeds were started. The last phosphorus was checked on 4/10 and has not been checked since. Suspect he will continue to refeed on D5-1/2NS (also received dextrose overnight from D50 boluses and D10 infusion).  Medications reviewed and include: liquid MVI daily per tube, Tamiflu, senna-docusate, sertraline, ceftriaxone, Precedex gtt, D5-1/2NS with KCl 40 mEq/L at 75 mL/hr (90 grams dextrose, 306 kcal daily), potassium chloride 10 mEq IV 4 times today.  Labs reviewed: CBG 12-163 past 24 hrs, Sodium 148, Potassium 2.8 (drop from 3.6 yesterday), BUN 65, Creatinine 2.12. Phosphorus dropped <1 on 4/9. On 4/10 AM was 1.4. No re-check since.  I/O: 2985 mL UOP yesterday (2.9 mL/kg/hr)  Weight trend: 42.5 kg on 4/11; -2 kg from admission  Discussed with RN and on rounds.  Diet Order:  Diet NPO time specified  EDUCATION NEEDS:   Not appropriate for education at this time  Skin:  Skin Assessment: Reviewed RN Assessment(scattered ecchymosis)  Last BM:  12/24/2017 - large type 7  Height:   Ht Readings from Last 1 Encounters:  12/23/17 '5\' 9"'  (1.753 m)    Weight:   Wt Readings from Last 1 Encounters:  12/25/17 93 lb 11.1 oz (42.5 kg)    Ideal Body  Weight:  72.7 kg  BMI:  Body mass index is 13.84 kg/m.  Estimated Nutritional Needs:   Kcal:  1620-1890 (MSJ x 1.2-1.4)  Protein:  62-82 grams (1.5-2 grams/kg)  Fluid:  1.6-1.9 L/day (1 mL/kcal)  Willey Blade, MS, RD, LDN Office: [phone] Pager: [phone] After Hours/Weekend Pager: [phone]

## 2017-12-26 ENCOUNTER — Inpatient Hospital Stay (HOSPITAL_COMMUNITY)
Admit: 2017-12-26 | Discharge: 2017-12-26 | Disposition: A | Discharge status: Home / Self Care | Payer: Medicaid Other | Attending: Pulmonary Disease | Admitting: Pulmonary Disease

## 2017-12-26 ENCOUNTER — Inpatient Hospital Stay: Payer: Medicaid Other

## 2017-12-26 DIAGNOSIS — R4182 Altered mental status, unspecified: Secondary | ICD-10-CM

## 2017-12-26 DIAGNOSIS — E440 Moderate protein-calorie malnutrition: Secondary | ICD-10-CM

## 2017-12-26 DIAGNOSIS — B955 Unspecified streptococcus as the cause of diseases classified elsewhere: Secondary | ICD-10-CM

## 2017-12-26 DIAGNOSIS — I340 Nonrheumatic mitral (valve) insufficiency: Secondary | ICD-10-CM

## 2017-12-26 DIAGNOSIS — R7881 Bacteremia: Secondary | ICD-10-CM

## 2017-12-26 DIAGNOSIS — J154 Pneumonia due to other streptococci: Secondary | ICD-10-CM

## 2017-12-26 LAB — BASIC METABOLIC PANEL
Anion gap: 7 (ref 5–15)
BUN: 35 mg/dL — AB (ref 6–20)
CALCIUM: 8.6 mg/dL — AB (ref 8.9–10.3)
CO2: 30 mmol/L (ref 22–32)
Chloride: 106 mmol/L (ref 101–111)
Creatinine, Ser: 1.33 mg/dL — ABNORMAL HIGH (ref 0.61–1.24)
GFR calc Af Amer: >60 mL/min (ref >60)
GLUCOSE: 42 mg/dL — AB (ref 65–99)
Potassium: 4 mmol/L (ref 3.5–5.1)
Sodium: 143 mmol/L (ref 135–145)

## 2017-12-26 LAB — COMPREHENSIVE METABOLIC PANEL
ALBUMIN: 2.3 g/dL — AB (ref 3.5–5.0)
ALK PHOS: 342 U/L — AB (ref 38–126)
ALT: 49 U/L (ref 17–63)
ANION GAP: 7 (ref 5–15)
AST: 62 U/L — ABNORMAL HIGH (ref 15–41)
BILIRUBIN TOTAL: 0.9 mg/dL (ref 0.3–1.2)
BUN: 43 mg/dL — ABNORMAL HIGH (ref 6–20)
CALCIUM: 7.8 mg/dL — AB (ref 8.9–10.3)
CO2: 31 mmol/L (ref 22–32)
Chloride: 105 mmol/L (ref 101–111)
Creatinine, Ser: 1.59 mg/dL — ABNORMAL HIGH (ref 0.61–1.24)
GFR calc non Af Amer: 56 mL/min — ABNORMAL LOW (ref >60)
GLUCOSE: 229 mg/dL — AB (ref 65–99)
Potassium: 4.1 mmol/L (ref 3.5–5.1)
Sodium: 143 mmol/L (ref 135–145)
TOTAL PROTEIN: 6.7 g/dL (ref 6.5–8.1)

## 2017-12-26 LAB — GLUCOSE, CAPILLARY
GLUCOSE-CAPILLARY: 142 mg/dL — AB (ref 65–99)
GLUCOSE-CAPILLARY: 142 mg/dL — AB (ref 65–99)
GLUCOSE-CAPILLARY: 38 mg/dL — AB (ref 65–99)
GLUCOSE-CAPILLARY: 88 mg/dL (ref 65–99)
Glucose-Capillary: 194 mg/dL — ABNORMAL HIGH (ref 65–99)
Glucose-Capillary: 269 mg/dL — ABNORMAL HIGH (ref 65–99)
Glucose-Capillary: 313 mg/dL — ABNORMAL HIGH (ref 65–99)
Glucose-Capillary: 269 mg/dL — ABNORMAL HIGH (ref 65–99)
Glucose-Capillary: 26 mg/dL — CL (ref 65–99)

## 2017-12-26 LAB — CBC
HEMATOCRIT: 26.6 % — AB (ref 40.0–52.0)
HEMOGLOBIN: 9 g/dL — AB (ref 13.0–18.0)
MCH: 30.4 pg (ref 26.0–34.0)
MCHC: 34 g/dL (ref 32.0–36.0)
MCV: 89.4 fL (ref 80.0–100.0)
Platelets: 130 10*3/uL — ABNORMAL LOW (ref 150–440)
RBC: 2.97 MIL/uL — ABNORMAL LOW (ref 4.40–5.90)
RDW: 13.7 % (ref 11.5–14.5)
WBC: 12.3 10*3/uL — ABNORMAL HIGH (ref 3.8–10.6)

## 2017-12-26 LAB — CULTURE, RESPIRATORY

## 2017-12-26 LAB — MAGNESIUM: MAGNESIUM: 1.3 mg/dL — AB (ref 1.7–2.4)

## 2017-12-26 LAB — CULTURE, RESPIRATORY W GRAM STAIN: Culture: NORMAL

## 2017-12-26 LAB — PROCALCITONIN: Procalcitonin: 3.88 ng/mL

## 2017-12-26 LAB — PHOSPHORUS: PHOSPHORUS: 3.2 mg/dL (ref 2.5–4.6)

## 2017-12-26 LAB — ECHOCARDIOGRAM COMPLETE
Height: 69 in
Weight: 1432.11 oz

## 2017-12-26 MED ORDER — INSULIN ASPART 100 UNIT/ML ~~LOC~~ SOLN
3.0000 [IU] | SUBCUTANEOUS | Status: DC
  Administered 2017-12-26: 5 [IU] via SUBCUTANEOUS
  Administered 2017-12-27: 6 [IU] via SUBCUTANEOUS
  Administered 2017-12-27 (×2): 9 [IU] via SUBCUTANEOUS
  Filled 2017-12-26 (×4): qty 1

## 2017-12-26 MED ORDER — MAGNESIUM SULFATE 50 % IJ SOLN: 4.0000 g | Freq: Once | INTRAMUSCULAR | Status: DC

## 2017-12-26 MED ORDER — INSULIN ASPART 100 UNIT/ML ~~LOC~~ SOLN: 0.0000 [IU] | SUBCUTANEOUS | Status: DC

## 2017-12-26 MED ORDER — MAGNESIUM SULFATE 4 GM/100ML IV SOLN
4.0000 g | Freq: Once | INTRAVENOUS | Status: AC
  Administered 2017-12-26: 4 g via INTRAVENOUS
  Filled 2017-12-26: qty 100

## 2017-12-26 MED ORDER — DEXTROSE-NACL 5-0.45 % IV SOLN
INTRAVENOUS | Status: DC
  Administered 2017-12-26 – 2017-12-27 (×3): via INTRAVENOUS

## 2017-12-26 MED ORDER — ASPIRIN EC 81 MG PO TBEC
81.0000 mg | DELAYED_RELEASE_TABLET | Freq: Every day | ORAL | Status: DC
  Administered 2017-12-27 – 2017-12-31 (×5): 81 mg via ORAL
  Filled 2017-12-26 (×5): qty 1

## 2017-12-26 MED ORDER — ENSURE ENLIVE PO LIQD
237.0000 mL | ORAL | Status: DC
  Administered 2017-12-26 – 2017-12-27 (×2): 237 mL via ORAL

## 2017-12-26 MED ORDER — SODIUM CHLORIDE 0.45 % IV SOLN
INTRAVENOUS | Status: DC
  Administered 2017-12-26: 14:00:00 via INTRAVENOUS

## 2017-12-26 MED ORDER — TRAMADOL HCL 50 MG PO TABS
50.0000 mg | ORAL_TABLET | Freq: Two times a day (BID) | ORAL | Status: DC | PRN
  Administered 2017-12-27 – 2017-12-28 (×4): 50 mg via ORAL
  Filled 2017-12-26 (×5): qty 1

## 2017-12-26 MED ORDER — INSULIN GLARGINE 100 UNIT/ML ~~LOC~~ SOLN
10.0000 [IU] | Freq: Every day | SUBCUTANEOUS | Status: DC
  Filled 2017-12-26: qty 0.1

## 2017-12-26 MED ORDER — ENSURE ENLIVE PO LIQD: 237.0000 mL | Freq: Two times a day (BID) | ORAL | Status: DC

## 2017-12-26 NOTE — Progress Notes (Signed)
[facility]                                                                                                                                                                                  Patient Demographics   Maurice Davidson, is a 32 y.o. male, DOB - 07/09/1985, LKG:[id number]  Admit date - 12/22/2017   Admitting Physician Lance Coon, MD  Outpatient Primary MD for the patient is Trinna Post, PA-C   LOS - 4  Subjective: Patient on high flow nasal cannula currently drowsy  Review of Systems:   CONSTITUTIONAL: Drowsy  Vitals:   Vitals:   12/26/17 1000 12/26/17 1100 12/26/17 1154 12/26/17 1200  BP:  (!) 126/91  (!) 147/96  Pulse: (!) 104 95  97  Resp: (!) 31 (!) 23  16  Temp: 99.9 F (37.7 C) 99.1 F (37.3 C)  99.7 F (37.6 C)  TempSrc:      SpO2: 91% 100% 98%   Weight:      Height:        Wt Readings from Last 3 Encounters:  12/26/17 40.6 kg (89 lb 8.1 oz)  10/21/17 44.5 kg (98 lb)  06/18/17 43.5 kg (96 lb)     Intake/Output Summary (Last 24 hours) at 12/26/2017 1723 Last data filed at 12/26/2017 1200 Gross per 24 hour  Intake 2687.75 ml  Output 4335 ml  Net -1647.25 ml    Physical Exam:   GENERAL: Critically ill HEAD, EYES, EARS, NOSE AND THROAT: Atraumatic, normocephalic. Extraocular muscles are intact. Pupils equal and reactive to light. Sclerae anicteric. No conjunctival injection. No oro-pharyngeal erythema.  NECK: Supple. There is no jugular venous distention. No bruits, no lymphadenopathy, no thyromegaly.  HEART: Regular rate and rhythm,. No murmurs, no rubs, no clicks.  LUNGS: ET tube in place, diminished breath sounds ABDOMEN: Soft, flat, nontender, nondistended. Has good bowel sounds. No hepatosplenomegaly appreciated.  EXTREMITIES: No evidence of any cyanosis, clubbing, or peripheral edema.  +2 pedal and radial pulses bilaterally.  NEUROLOGIC: Drowsy  sKIN: Moist and warm with no rashes appreciated.   Psych: Drowsy LN: No inguinal LN enlargement    Antibiotics   Anti-infectives (From admission, onward)   Start     Dose/Rate Route Frequency Ordered Stop   12/26/17 0100  vancomycin (VANCOCIN) IVPB 750 mg/150 ml premix     750 mg 150 mL/hr over 60 Minutes Intravenous Every 24 hours 12/25/17 1816     12/25/17 2200  cefTRIAXone (ROCEPHIN) 2 g in sodium chloride 0.9 % 100 mL IVPB     2 g 200 mL/hr over 30 Minutes Intravenous Every 12 hours 12/25/17 1629  12/25/17 1645  vancomycin (VANCOCIN) IVPB 1000 mg/200 mL premix     1,000 mg 200 mL/hr over 60 Minutes Intravenous  Once 12/25/17 1635 12/25/17 1938   12/25/17 1000  oseltamivir (TAMIFLU) capsule 30 mg  Status:  Discontinued     30 mg Oral Daily 12/24/17 1016 12/25/17 1629   12/24/17 1000  vancomycin (VANCOCIN) 500 mg in sodium chloride 0.9 % 100 mL IVPB  Status:  Discontinued     500 mg 100 mL/hr over 60 Minutes Intravenous Every 24 hours 12/22/17 2357 12/23/17 0920   12/23/17 2100  ceFEPIme (MAXIPIME) 1 g in sodium chloride 0.9 % 100 mL IVPB  Status:  Discontinued     1 g 200 mL/hr over 30 Minutes Intravenous Every 24 hours 12/22/17 2357 12/23/17 1412   12/23/17 1800  cefTRIAXone (ROCEPHIN) 2 g in sodium chloride 0.9 % 100 mL IVPB  Status:  Discontinued     2 g 200 mL/hr over 30 Minutes Intravenous Daily-1800 12/23/17 1413 12/25/17 1629   12/22/17 2345  oseltamivir (TAMIFLU) capsule 30 mg  Status:  Discontinued     30 mg Per Tube Daily 12/22/17 2305 12/24/17 1016   12/22/17 2130  ceFEPIme (MAXIPIME) 2 g in sodium chloride 0.9 % 100 mL IVPB     2 g 200 mL/hr over 30 Minutes Intravenous  Once 12/22/17 2127 12/22/17 2246   12/22/17 2115  piperacillin-tazobactam (ZOSYN) IVPB 3.375 g  Status:  Discontinued     3.375 g 100 mL/hr over 30 Minutes Intravenous  Once 12/22/17 2114 12/22/17 2118   12/22/17 2115  vancomycin (VANCOCIN) IVPB 1000 mg/200 mL premix     1,000 mg 200 mL/hr over 60 Minutes Intravenous  Once 12/22/17 2114  12/22/17 2308      Medications   Scheduled Meds: . aspirin EC  81 mg Oral Daily  . budesonide (PULMICORT) nebulizer solution  0.25 mg Nebulization BID  . chlorhexidine gluconate (MEDLINE KIT)  15 mL Mouth Rinse BID  . Chlorhexidine Gluconate Cloth  6 each Topical Q0600  . dexamethasone  2 mg Intravenous Q6H  . [START ON 12/27/2017] feeding supplement (ENSURE ENLIVE)  237 mL Oral BID BM  . gabapentin  800 mg Oral TID  . heparin  5,000 Units Subcutaneous Q8H  . insulin aspart  3-9 Units Subcutaneous Q4H  . insulin glargine  10 Units Subcutaneous QHS  . ipratropium-albuterol  3 mL Nebulization Q6H  . multivitamin  15 mL Per Tube Daily  . mupirocin ointment  1 application Nasal BID  . senna-docusate  1 tablet Per Tube BID  . sertraline  100 mg Oral Daily  . sodium chloride flush  10-40 mL Intracatheter Q12H   Continuous Infusions: . sodium chloride 75 mL/hr at 12/26/17 1356  . sodium chloride    . cefTRIAXone (ROCEPHIN)  IV Stopped (12/26/17 1407)  . dexmedetomidine (PRECEDEX) IV infusion Stopped (12/26/17 1400)  . vancomycin Stopped (12/26/17 1656)   PRN Meds:.sodium chloride, acetaminophen, dextrose, LORazepam, ondansetron (ZOFRAN) IV, sodium chloride flush, traMADol   Data Review:   Micro Results Recent Results (from the past 240 hour(s))  Blood Culture (routine x 2)     Status: Abnormal   Collection Time: 12/22/17  9:31 PM  Result Value Ref Range Status   Specimen Description   Final    BLOOD RIGHT ANTECUBITAL Performed at [facility], [address], [place],  [address]    Special Requests   Final    BOTTLES DRAWN AEROBIC AND ANAEROBIC Blood Culture results  may not be optimal due to an excessive volume of blood received in culture bottles Performed at [facility], [address] [place], [place] [address]    Culture  Setup Time   Final    Organism ID to follow [place] CRITICAL RESULT  CALLED TO, READ BACK BY AND VERIFIED WITH: LISA KLUTTZ AT 3086 12/23/17 SDR Performed at [facility], [address] [place], [place] [address]    Culture (A)  Final    STREPTOCOCCUS PNEUMONIAE SUSCEPTIBILITIES PERFORMED ON PREVIOUS CULTURE WITHIN THE LAST 5 DAYS. Performed at [facility], [place] [address], [place], [place] [address]    Report Status 12/25/2017 FINAL  Final  Blood Culture (routine x 2)     Status: Abnormal   Collection Time: 12/22/17  9:31 PM  Result Value Ref Range Status   Specimen Description   Final    BLOOD BLOOD LEFT FOREARM Performed at [facility], [address], [place], [place] [address]    Special Requests   Final    BOTTLES DRAWN AEROBIC AND ANAEROBIC Blood Culture adequate volume Performed at [facility], [address] [place], [place] [address]    Culture  Setup Time   Final    GRAM POSITIVE COCCI IN BOTH AEROBIC AND ANAEROBIC BOTTLES CRITICAL VALUE NOTED.  VALUE IS CONSISTENT WITH PREVIOUSLY REPORTED AND CALLED VALUE. Performed at [facility], [address] [place], [place] [address]    Culture STREPTOCOCCUS PNEUMONIAE (A)  Final   Report Status 12/25/2017 FINAL  Final   Organism ID, Bacteria STREPTOCOCCUS PNEUMONIAE  Final      Susceptibility   Streptococcus pneumoniae - MIC*    ERYTHROMYCIN >=8 RESISTANT Resistant     LEVOFLOXACIN 1 SENSITIVE Sensitive     PENICILLIN (meningitis) 1 RESISTANT Resistant     PENICILLIN (non-meningitis) 1 SENSITIVE Sensitive     CEFTRIAXONE (non-meningitis) 1 SENSITIVE Sensitive     CEFTRIAXONE (meningitis) 1 INTERMEDIATE Intermediate     * STREPTOCOCCUS PNEUMONIAE  Blood Culture ID Panel (Reflexed)     Status: Abnormal   Collection Time: 12/22/17  9:31 PM  Result Value Ref Range Status   Enterococcus species NOT DETECTED NOT DETECTED Final   Listeria monocytogenes NOT DETECTED NOT DETECTED Final   Staphylococcus species NOT DETECTED NOT DETECTED Final    Staphylococcus aureus NOT DETECTED NOT DETECTED Final   Streptococcus species DETECTED (A) NOT DETECTED Final    Comment: CRITICAL RESULT CALLED TO, READ BACK BY AND VERIFIED WITH:  LISA KLUTTZ AT 3664 12/23/17 SDR    Streptococcus agalactiae NOT DETECTED NOT DETECTED Final   Streptococcus pneumoniae DETECTED (A) NOT DETECTED Final    Comment: CRITICAL RESULT CALLED TO, READ BACK BY AND VERIFIED WITH:  LISA KLUTTZ AT 4034 12/23/17 SDR    Streptococcus pyogenes NOT DETECTED NOT DETECTED Final   Acinetobacter baumannii NOT DETECTED NOT DETECTED Final   Enterobacteriaceae species NOT DETECTED NOT DETECTED Final   Enterobacter cloacae complex NOT DETECTED NOT DETECTED Final   Escherichia coli NOT DETECTED NOT DETECTED Final   Klebsiella oxytoca NOT DETECTED NOT DETECTED Final   Klebsiella pneumoniae NOT DETECTED NOT DETECTED Final   Proteus species NOT DETECTED NOT DETECTED Final   Serratia marcescens NOT DETECTED NOT DETECTED Final   Haemophilus influenzae NOT DETECTED NOT DETECTED Final   Neisseria meningitidis NOT DETECTED NOT DETECTED Final   Pseudomonas aeruginosa NOT DETECTED NOT DETECTED Final   Candida albicans  NOT DETECTED NOT DETECTED Final   Candida glabrata NOT DETECTED NOT DETECTED Final   Candida krusei NOT DETECTED NOT DETECTED Final   Candida parapsilosis NOT DETECTED NOT DETECTED Final   Candida tropicalis NOT DETECTED NOT DETECTED Final    Comment: Performed at [facility], [address] [place], [place] [address]  MRSA PCR Screening     Status: Abnormal   Collection Time: 12/23/17  8:47 AM  Result Value Ref Range Status   MRSA by PCR POSITIVE (A) NEGATIVE Final    Comment:        The GeneXpert MRSA Assay (FDA approved for NASAL specimens only), is one component of a comprehensive MRSA colonization surveillance program. It is not intended to diagnose MRSA infection nor to guide or monitor treatment for MRSA infections. RESULT CALLED TO, READ BACK BY  AND VERIFIED WITH: C/ MYRA FLOWERS _0  12/23/17 [facility] Performed at [facility], [address] [place], [place] [address]   Culture, respiratory (NON-Expectorated)     Status: None   Collection Time: 12/23/17 12:24 PM  Result Value Ref Range Status   Specimen Description   Final    SPUTUM Performed at [facility], [address] [place], [place] [address]    Special Requests   Final    NONE Performed at [facility], [address] [place], [place] [address]    Gram Stain   Final    ABUNDANT WBC PRESENT,BOTH PMN AND MONONUCLEAR FEW GRAM POSITIVE COCCI    Culture   Final    Consistent with normal respiratory flora. Performed at [facility], [place] [address] [place], [place] [address]    Report Status 12/26/2017 FINAL  Final    Radiology Reports Dg Abd 1 View  Result Date: 12/23/2017 CLINICAL DATA:  Orogastric tube placement EXAM: ABDOMEN - 1 VIEW COMPARISON:  December 22, 2017 FINDINGS: Orogastric tube tip and side port in proximal stomach. Visualized bowel gas pattern is unremarkable. No evident bowel obstruction or free air. Endotracheal tube tip is 5.1 cm above the carina. No pneumothorax. Multiple foci of airspace disease noted bilaterally. IMPRESSION: Orogastric tube tip and side port in proximal stomach. Visualized bowel gas pattern unremarkable. Multiple foci of airspace disease in the lungs. Electronically Signed   By: Lowella Grip III M.D.   On: 12/23/2017 10:58   Ct Head Wo Contrast  Result Date: 12/25/2017 CLINICAL DATA:  Altered mental status with diabetic ketoacidosis. EXAM: CT HEAD WITHOUT CONTRAST TECHNIQUE: Contiguous axial images were obtained from the base of the skull through the vertex without intravenous contrast. COMPARISON:  Jan 15, 2016 FINDINGS: Brain: The ventricles are normal in size and configuration. There is no intracranial mass, hemorrhage, extra-axial fluid collection, or midline shift. Gray-white compartments are  normal. No acute infarct. Vascular: No hyperdense vessel. No appreciable vascular calcification. Skull: The bony calvarium appears intact. There is mild soft tissue swelling over the right parietal region. No scalp hematoma evident. Sinuses/Orbits: There is opacification throughout multiple ethmoid air cells bilaterally. There is mild mucosal thickening in both inferior frontal sinuses. There is extensive mucosal thickening in the sphenoid sinus regions as well as in both maxillary antra. No air-fluid levels. Orbits appear symmetric bilaterally. Other: Mastoid air cells are clear. IMPRESSION: 1. No intracranial mass or hemorrhage. Gray-white compartments appear normal. 2.  Extensive multifocal paranasal sinus disease. 3. Soft tissue swelling over the right parietal bone. No well-defined scalp hematoma. Underlying bone appears normal. Electronically Signed   By: Gwyndolyn Saxon  Jasmine December III M.D.   On: 12/25/2017 15:23   Dg Chest Port 1 View  Result Date: 12/26/2017 CLINICAL DATA:  Respiratory failure EXAM: PORTABLE CHEST 1 VIEW COMPARISON:  12/25/2017 FINDINGS: Cardiac shadow is stable. Right-sided PICC line is now noted with catheter tip in the mid right atrium. Lungs are well aerated bilaterally with diffuse bilateral infiltrate right greater than left. The overall appearance is stable given some technical variations in the film. No acute bony abnormality is seen. Old rib fractures are noted on the right. IMPRESSION: Persistent bilateral infiltrates. PICC line as described. Electronically Signed   By: Inez Catalina M.D.   On: 12/26/2017 07:18   Dg Chest Port 1 View  Result Date: 12/25/2017 CLINICAL DATA:  Respiratory failure EXAM: PORTABLE CHEST 1 VIEW COMPARISON:  Three days prior FINDINGS: Extubation with lower lung volumes. Extensive asymmetric airspace disease with interval worsening. No visible cavitation or effusion. Consolidation is particularly marked in the right upper lobe. IMPRESSION: 1. Progressive  pneumonia since 3 days prior. 2. Lower volumes after extubation. Electronically Signed   By: Monte Fantasia M.D.   On: 12/25/2017 07:27   Dg Chest Portable 1 View  Result Date: 12/22/2017 CLINICAL DATA:  ETT placement EXAM: PORTABLE CHEST 1 VIEW COMPARISON:  12/22/2017, 01/15/2016 FINDINGS: Endotracheal tube tip is about 4.6 cm superior to the carina. Esophageal tube side-port overlies the distal esophagus. Worsening right greater than left ground-glass opacity and airspace disease. Normal heart size. No pneumothorax. IMPRESSION: 1. Endotracheal tube tip about 4.6 cm superior to carina 2. Esophageal tube side port overlies distal esophagus, suggest further advancement for more optimal positioning 3. Worsening of multifocal airspace disease, right greater than left consistent with multifocal pneumonia Electronically Signed   By: Donavan Foil M.D.   On: 12/22/2017 23:21   Dg Chest Portable 1 View  Result Date: 12/22/2017 CLINICAL DATA:  Combative, agitation, hyperglycemia. History of diabetes, sepsis. EXAM: PORTABLE CHEST 1 VIEW COMPARISON:  Chest radiograph Jan 15, 2016 FINDINGS: Cardiomediastinal silhouette is normal. Multifocal consolidation bilateral lungs without pleural effusion. No pneumothorax. Soft tissue planes and included osseous structures are nonsuspicious. IMPRESSION: Multifocal pneumonia, potential septic emboli etiology. Electronically Signed   By: Elon Alas M.D.   On: 12/22/2017 20:45   Dg Abd Portable 1 View  Result Date: 12/22/2017 CLINICAL DATA:  NG tube EXAM: PORTABLE ABDOMEN - 1 VIEW COMPARISON:  None. FINDINGS: Multifocal pulmonary infiltrates. Esophageal tube tip overlies the proximal stomach, side-port projects over the distal esophagus IMPRESSION: Side-port of the esophageal tube overlies the distal esophagus, suggest further advancement for more optimal positioning Electronically Signed   By: Donavan Foil M.D.   On: 12/22/2017 23:21   [place] Ekg Site Rite  Result Date:  12/25/2017 If Site Rite image not attached, placement could not be confirmed due to current cardiac rhythm.    CBC Recent Labs  Lab 12/22/17 1956 12/23/17 0240 12/23/17 0535 12/25/17 0645 12/26/17 0501  WBC 10.3 7.5 7.2 14.2* 12.3*  HGB 10.8* 11.5* 11.3* 9.3* 9.0*  HCT 36.5* 34.8* 35.0* 27.6* 26.6*  PLT 276 249 277 147* 130*  MCV 101.2* 92.8 91.9 89.1 89.4  MCH 30.0 30.5 29.6 30.1 30.4  MCHC 29.6* 32.9 32.2 33.8 34.0  RDW 16.1* 14.5 14.2 14.0 13.7  LYMPHSABS 0.8*  --   --   --   --   MONOABS 0.1*  --   --   --   --   EOSABS 0.0  --   --   --   --  BASOSABS 0.0  --   --   --   --     Chemistries  Recent Labs  Lab 12/22/17 1956  12/23/17 1355 12/23/17 1653 12/24/17 0511 12/25/17 0645 12/25/17 1830 12/26/17 0501  NA 129*   < > 143 142 140 148* 147* 143  K 4.3   < > 3.2* 2.7* 3.6 2.8* 3.2* 4.1  CL 95*   < > 107 106 104 110 108 105  CO2 9*   < > _0 33* 31  GLUCOSE 937*   < > 239* 204* 309* 104* 31* 229*  BUN 93*   < > 85* 84* 80* 65* 52* 43*  CREATININE 3.12*   < > 2.68* 2.68* 2.70* 2.12* 1.75* 1.59*  CALCIUM 6.8*   < > 7.0* 6.9* 7.1* 7.6* 7.8* 7.8*  MG  --   --  2.6* 2.5* 2.6*  --   --  1.3*  AST 42*  --   --   --   --  104*  --  62*  ALT 17  --   --   --   --  49  --  49  ALKPHOS 136*  --   --   --   --  323*  --  342*  BILITOT 1.8*  --   --   --   --  0.6  --  0.9   < > = values in this interval not displayed.   ------------------------------------------------------------------------------------------------------------------ estimated creatinine clearance is 37.9 mL/min (A) (by C-G formula based on SCr of 1.59 mg/dL (H)). ------------------------------------------------------------------------------------------------------------------ No results for input(s): HGBA1C in the last 72 hours. ------------------------------------------------------------------------------------------------------------------ No results for input(s): CHOL, HDL, LDLCALC, TRIG,  CHOLHDL, LDLDIRECT in the last 72 hours. ------------------------------------------------------------------------------------------------------------------ No results for input(s): TSH, T4TOTAL, T3FREE, THYROIDAB in the last 72 hours.  Invalid input(s): FREET3 ------------------------------------------------------------------------------------------------------------------ No results for input(s): VITAMINB12, FOLATE, FERRITIN, TIBC, IRON, RETICCTPCT in the last 72 hours.  Coagulation profile No results for input(s): INR, PROTIME in the last 168 hours.  No results for input(s): DDIMER in the last 72 hours.  Cardiac Enzymes No results for input(s): CKMB, TROPONINI, MYOGLOBIN in the last 168 hours.  Invalid input(s): CK ------------------------------------------------------------------------------------------------------------------ Invalid input(s): [place]  Patient is a 32 year old admitted with sepsis   Severe sepsis ([place]) -due to multifocal pneumonia as well as strep pneumo sepsis in the blood   Continue IV ceftriaxone and vancomycin   Acute respiratory failure with hypoxia -continue high flow oxygen   DKA (diabetic ketoacidoses) ([place]) -resolved continue Lantus and sliding scale insulin  Multifocal pneumonia -antibiotics as per pulmonary critical care   Influenza A -continue Tamiflu   Acute renal failure superimposed on stage 3 chronic kidney disease ([place]) -due to his sepsis, improved with IV fluid   Seizures ([place]) -continue Keppra      Code Status Orders  (From admission, onward)        Start     Ordered   12/23/17 0034  Full code  Continuous     12/23/17 0033    Code Status History    Date Active Date Inactive Code Status Order ID Comments User Context   11/28/2015 0059 11/30/2015 1346 Full Code [id number]  Lance Coon, MD Inpatient   06/02/2015 1002 06/04/2015 1455 Full Code [id number]  Gladstone Lighter, MD Inpatient           Consults  intensivist  DVT Prophylaxis heparin  Lab Results  Component Value Date  PLT 130 (L) 12/26/2017     Time Spent in minutes   18mn Greater than 50% of time spent in care coordination and counseling patient regarding the condition and plan of care.   SDustin FlockM.D on 12/26/2017 at 5:23 PM  Between 7am to 6pm - Pager - [phone]  After 6pm go to [email or web address] - [employer]   Office  [phone]

## 2017-12-26 NOTE — Progress Notes (Signed)
Inpatient Diabetes Program Recommendations  AACE/ADA: New Consensus Statement on Inpatient Glycemic Control (2015)  Target Ranges:  Prepandial:   less than 140 mg/dL      Peak postprandial:   less than 180 mg/dL (1-2 hours)      Critically ill patients:  140 - 180 mg/dL   Lab Results  Component Value Date   GLUCAP 313 (H) 12/26/2017   HGBA1C 9.1 10/23/2017    Review of Glycemic Control Results for Maurice Davidson, Maurice Davidson (MRN [id number]) as of 12/26/2017 12:21  Ref. Range 12/26/2017 02:16 12/26/2017 04:01 12/26/2017 07:07 12/26/2017 11:24  Glucose-Capillary Latest Ref Range: 65 - 99 mg/dL 142 (H) 194 (H) 269 (H) 313 (H)   Diabetes history:DM1 (makes no insulin; requires basal, correction, and meal coverage) Outpatient Diabetes medications:Lantus 10 units QHS, Novolog 5 units TID with meals plus additional 1 unit for every 12 grams of carbs Current orders for Inpatient glycemic control: ICU glycemic control order set, Novolog 3-6-9 q 4 hours, Lantus 10 units daily Inpatient Diabetes Program Recommendations:  Please consider reducing Novolog correction to Novolog sensitive 1-2-3 due to history of hypoglycemia and reduced renal function.  Called and discussed with RN.  Thanks,  Adah Perl, RN, BC-ADM Inpatient Diabetes Coordinator Pager [phone] (8a-5p)

## 2017-12-26 NOTE — Progress Notes (Signed)
Nutrition Follow-up  DOCUMENTATION CODES:   Severe malnutrition in context of chronic illness, Underweight  INTERVENTION:  Diet advanced today but patient is not consuming food.  If patient continues not to consume anything, recommend consider placement of small bore feeding tube.  Continue to replete electrolytes if tubefeeding is initiated in setting of refeeding syndrome.  If tube feeds are initiated recommend initiating Vital 1.5 at 15 mL/hr and advancing by 15 mL/hr every 8 hours to goal rate of 45 mL/hr (1080 mL goal daily volume). Provides 1620 kcal, 73 grams of protein, 821 mL H2O daily.  If patient remains on IV fluids recommend minimum free water flush of 30 mL Q4hrs to maintain tube patency. If IV fluids are discontinued recommend free water flush of 240 mL Q6hrs, which will provide a total of 1781 mL H2O daily including water in tube feeds.  NUTRITION DIAGNOSIS:   Severe Malnutrition related to chronic illness(DM, gastroparesis, drug abuse) as evidenced by severe fat depletion, moderate muscle depletion, severe muscle depletion. -ongoing  GOAL:   Patient will meet greater than or equal to 90% of their needs  -unmet  MONITOR:   PO intake, I & O's, Labs   ASSESSMENT:   32 year old male with PMHx of anxiety, chronic pain, opioid dependence, drug-seeking behavior, seizure disorder, DM, gastroparesis, HTN who is admitted with severe respiratory failure from PNA requiring intubation evening of 4/8, positive for influenza A, acute renal failure, sepsis with severe acidosis, DKA, and encephalopathy.  -Extubated 4/10 OGT removed.  Discussed in rounds. Patient did not eat anything today. Had multiple hypoglycemic episodes yesterday requiring D50 May need tube feeds if unable to meet needs via PO intake.  Labs reviewed:  CBGs 313, 269, 194 Mg 1.3  Medications reviewed and include:  Decadron, Insulin, MVI liquid, Senokot-S 1/2 NS at 51ml/hr Precedex gtt  Diet  Order:  Diet Carb Modified Fluid consistency: Thin; Room service appropriate? Yes  EDUCATION NEEDS:   Not appropriate for education at this time  Skin:  Skin Assessment: Reviewed RN Assessment(scattered ecchymosis)  Last BM:  12/26/2017  Height:   Ht Readings from Last 1 Encounters:  12/23/17 5\' 9"  (1.753 m)    Weight:   Wt Readings from Last 1 Encounters:  12/26/17 89 lb 8.1 oz (40.6 kg)    Ideal Body Weight:  72.7 kg  BMI:  Body mass index is 13.22 kg/m.  Estimated Nutritional Needs:   Kcal:  1620-1890 (MSJ x 1.2-1.4)  Protein:  62-82 grams (1.5-2 grams/kg)  Fluid:  1.6-1.9 L/day (1 mL/kcal)   Satira Anis. Tyron Manetta, MS, RD LDN Inpatient Clinical Dietitian Pager [phone]

## 2017-12-26 NOTE — Consult Note (Signed)
Reason for Consult:AMS Referring Physician: Alva Garnet  CC: AMS  HPI: Maurice Davidson is an 32 y.o. male with a history of DM and seizures on no AED therapy, admitted in DKA with positive flu PCR as well.  Noted on CXR to have bilateral infiltrates.  Blood cultures positive for strep pneumo.  On initial presentation admitted with confusion.  Required intubation and Precedex.  Has since been extubated and the Precedex has been weaned.  Patient has remained altered.    Past Medical History:  Diagnosis Date  . Anemia   . Anxiety   . Aspiration pneumonia ([place])   . Chronic pain    take Suboxone  . Diabetes mellitus without complication ([place])   . Drug-seeking behavior   . Encephalopathy   . Gastroparesis   . H/O: GI bleed   . HTN (hypertension)   . MRSA (methicillin resistant staph aureus) culture positive   . Opioid dependence ([place])   . Renal disorder   . Seizures ([place])   . Sepsis [facility])     Past Surgical History:  Procedure Laterality Date  . ORTHOPEDIC SURGERY      Family History  Problem Relation Age of Onset  . Clotting disorder Sister        Von Willebrand's disease plus polycythemia vera  . Hypertension Mother     Social History:  reports that he has been smoking cigarettes.  He has been smoking about 1.00 pack per day. He has never used smokeless tobacco. He reports that he has current or past drug history. He reports that he does not drink alcohol.  Allergies  Allergen Reactions  . Benzodiazepines     Avoid prescribing. Not a true allergy  . Abilify [Aripiprazole] Rash    Medications:  I have reviewed the patient's current medications. Prior to Admission:  Medications Prior to Admission  Medication Sig Dispense Refill Last Dose  . acetaminophen (TYLENOL) 500 MG tablet Take 500 mg by mouth every 6 (six) hours as needed for mild pain.    PRN at PRN  . budesonide-formoterol (SYMBICORT) 160-4.5 MCG/ACT inhaler Inhale 2 puffs into the lungs 2 (two) times daily. 1  Inhaler 1 Unknown at Unknown  . Buprenorphine HCl-Naloxone HCl (SUBOXONE) 8-2 MG FILM Place 1 Film under the tongue 2 (two) times daily.   Unknown at Unknown  . glucagon (GLUCAGON EMERGENCY) 1 MG injection Inject 1 mg into the vein once as needed. 1 each 1 PRN at PRN  . ibuprofen (ADVIL,MOTRIN) 200 MG tablet Take 600-800 mg by mouth every 6 (six) hours as needed for headache or mild pain.   PRN at PRN  . insulin aspart (NOVOLOG) 100 UNIT/ML injection Inject 5 Units into the skin 3 (three) times daily with meals. Per PCP note on 10/21/17 pt taking Novolog 5 units TID with meals plus additional 1 unit for every 12 grams of carbs with max of 20 units     . insulin glargine (LANTUS) 100 UNIT/ML injection Inject 10 Units into the skin at bedtime.    Unknown at Unknown  . Multiple Vitamins-Minerals (MULTIVITAMIN WITH MINERALS) tablet Take 1 tablet by mouth daily.   Unknown at Unknown  . sertraline (ZOLOFT) 100 MG tablet Take 2 tablets (200 mg total) by mouth daily. 60 tablet 2 Unknown at Unknown  . traZODone (DESYREL) 50 MG tablet Take 3 tablets (150 mg total) by mouth at bedtime. 90 tablet 2 Unknown at Unknown  . gabapentin (NEURONTIN) 600 MG tablet Take 2 tablets (1,200 mg total)  by mouth 3 (three) times daily. 180 tablet 2    Scheduled: . aspirin EC  81 mg Oral Daily  . budesonide (PULMICORT) nebulizer solution  0.25 mg Nebulization BID  . chlorhexidine gluconate (MEDLINE KIT)  15 mL Mouth Rinse BID  . Chlorhexidine Gluconate Cloth  6 each Topical Q0600  . dexamethasone  2 mg Intravenous Q6H  . gabapentin  800 mg Oral TID  . heparin  5,000 Units Subcutaneous Q8H  . insulin aspart  3-9 Units Subcutaneous Q4H  . insulin glargine  10 Units Subcutaneous QHS  . ipratropium-albuterol  3 mL Nebulization Q6H  . multivitamin  15 mL Per Tube Daily  . mupirocin ointment  1 application Nasal BID  . senna-docusate  1 tablet Per Tube BID  . sertraline  100 mg Oral Daily  . sodium chloride flush  10-40 mL  Intracatheter Q12H    ROS: Does not provide due to mental status  Physical Examination: Blood pressure (!) 147/96, pulse 97, temperature 99.7 F (37.6 C), resp. rate 16, height _0  (1.753 m), weight 40.6 kg (89 lb 8.1 oz), SpO2 98 %.  HEENT-  Normocephalic, no lesions, without obvious abnormality.  Normal external eye and conjunctiva.  Normal TM's bilaterally.  Normal auditory canals and external ears. Normal external nose, mucus membranes and septum.  Normal pharynx. Cardiovascular- S1, S2 normal, pulses palpable throughout   Lungs- chest clear, no wheezing, rales, normal symmetric air entry Abdomen- soft, non-tender; bowel sounds normal; no masses,  no organomegaly Extremities- no edema Lymph-no adenopathy palpable Musculoskeletal-no joint tenderness, deformity or swelling Skin-warm and dry, no hyperpigmentation, vitiligo, or suspicious lesions  Neurological Examination   Mental Status: Lethargic but easily awakened.  Follows commands.  Speech minimal but fluent.  Cranial Nerves: II: Discs flat bilaterally; Does not blink to confrontation from the left, pupils equal, round, reactive to light and accommodation III,IV, VI: ptosis not present, extra-ocular motions intact bilaterally V,VII: smile symmetric, facial light touch sensation normal bilaterally VIII: hearing normal bilaterally IX,X: gag reflex present XI: bilateral shoulder shrug XII: midline tongue extension Motor: Moves all extremities against gravity.  No focal weakness noted Sensory: Does not respond to noxious stimuli in the LUE and both lower extremities.   Deep Tendon Reflexes: 2+ in the upper extremities and absent in the lower extremities Plantars: Right: mute   Left: mute Cerebellar: Patient does not perform due to level of cooperation Gait: not tested due to safety concerns  Laboratory Studies:   Basic Metabolic Panel: Recent Labs  Lab 12/23/17 1355 12/23/17 1653 12/24/17 0511 12/25/17 0645  12/25/17 1830 12/26/17 0501  NA 143 142 140 148* 147* 143  K 3.2* 2.7* 3.6 2.8* 3.2* 4.1  CL 107 106 104 110 108 105  CO2 _1 33* 31  GLUCOSE 239* 204* 309* 104* 31* 229*  BUN 85* 84* 80* 65* 52* 43*  CREATININE 2.68* 2.68* 2.70* 2.12* 1.75* 1.59*  CALCIUM 7.0* 6.9* 7.1* 7.6* 7.8* 7.8*  MG 2.6* 2.5* 2.6*  --   --  1.3*  PHOS <1.0* <1.0* 1.4*  --  1.9* 3.2    Liver Function Tests: Recent Labs  Lab 12/22/17 1956 12/23/17 2108 12/25/17 0645 12/26/17 0501  AST 42*  --  104* 62*  ALT 17  --  49 49  ALKPHOS 136*  --  323* 342*  BILITOT 1.8*  --  0.6 0.9  PROT 6.5  --  6.1* 6.7  ALBUMIN 2.8* 1.9* 2.1* 2.3*   No results  for input(s): LIPASE, AMYLASE in the last 168 hours. No results for input(s): AMMONIA in the last 168 hours.  CBC: Recent Labs  Lab 12/22/17 1956 12/23/17 0240 12/23/17 0535 12/25/17 0645 12/26/17 0501  WBC 10.3 7.5 7.2 14.2* 12.3*  NEUTROABS 9.4*  --   --   --   --   HGB 10.8* 11.5* 11.3* 9.3* 9.0*  HCT 36.5* 34.8* 35.0* 27.6* 26.6*  MCV 101.2* 92.8 91.9 89.1 89.4  PLT 276 249 277 147* 130*    Cardiac Enzymes: No results for input(s): CKTOTAL, CKMB, CKMBINDEX, TROPONINI in the last 168 hours.  BNP: Invalid input(s): POCBNP  CBG: Recent Labs  Lab 12/25/17 2234 12/26/17 0216 12/26/17 0401 12/26/17 0707 12/26/17 1124  GLUCAP 127* 142* 194* 269* 313*    Microbiology: Results for orders placed or performed during the hospital encounter of 12/22/17  Blood Culture (routine x 2)     Status: Abnormal   Collection Time: 12/22/17  9:31 PM  Result Value Ref Range Status   Specimen Description   Final    BLOOD RIGHT ANTECUBITAL Performed at [facility], [address], [place], [place] [address]    Special Requests   Final    BOTTLES DRAWN AEROBIC AND ANAEROBIC Blood Culture results may not be optimal due to an excessive volume of blood received in culture bottles Performed at [facility], [address]  [place], [place] [address]    Culture  Setup Time   Final    Organism ID to follow [place] TO, READ BACK BY AND VERIFIED WITH: LISA KLUTTZ AT 4709 12/23/17 [place] Performed at [facility], [address] [place], [place] [address]    Culture (A)  Final    STREPTOCOCCUS PNEUMONIAE SUSCEPTIBILITIES PERFORMED ON PREVIOUS CULTURE WITHIN THE LAST 5 DAYS. Performed at [facility], [place] [address] [place], [place] [address]    Report Status 12/25/2017 FINAL  Final  Blood Culture (routine x 2)     Status: Abnormal   Collection Time: 12/22/17  9:31 PM  Result Value Ref Range Status   Specimen Description   Final    BLOOD BLOOD LEFT FOREARM Performed at [facility], [address], [place], [place] [address]    Special Requests   Final    BOTTLES DRAWN AEROBIC AND ANAEROBIC Blood Culture adequate volume Performed at [facility], [address] [place], [place] [address]    Culture  Setup Time   Final    GRAM POSITIVE COCCI IN BOTH AEROBIC AND ANAEROBIC BOTTLES CRITICAL VALUE NOTED.  VALUE IS CONSISTENT WITH PREVIOUSLY REPORTED AND CALLED VALUE. Performed at [facility], [address] [place], [place] [address]    Culture STREPTOCOCCUS PNEUMONIAE (A)  Final   Report Status 12/25/2017 FINAL  Final   Organism ID, Bacteria STREPTOCOCCUS PNEUMONIAE  Final      Susceptibility   Streptococcus pneumoniae - MIC*    ERYTHROMYCIN >=8 RESISTANT Resistant     LEVOFLOXACIN 1 SENSITIVE Sensitive     PENICILLIN (meningitis) 1 RESISTANT Resistant     PENICILLIN (non-meningitis) 1 SENSITIVE Sensitive     CEFTRIAXONE (non-meningitis) 1 SENSITIVE Sensitive     CEFTRIAXONE (meningitis) 1 INTERMEDIATE Intermediate     * STREPTOCOCCUS PNEUMONIAE  Blood Culture ID Panel (Reflexed)     Status: Abnormal   Collection Time: 12/22/17  9:31 PM  Result Value Ref Range Status   Enterococcus  species NOT DETECTED NOT DETECTED Final   Listeria monocytogenes NOT DETECTED NOT DETECTED Final   Staphylococcus species NOT DETECTED NOT DETECTED Final   Staphylococcus aureus NOT DETECTED NOT DETECTED Final   Streptococcus species DETECTED (A) NOT DETECTED Final    Comment: CRITICAL RESULT CALLED TO, READ BACK BY AND VERIFIED WITH:  LISA KLUTTZ AT 0923 12/23/17 SDR    Streptococcus agalactiae NOT DETECTED NOT DETECTED Final   Streptococcus pneumoniae DETECTED (A) NOT DETECTED Final    Comment: CRITICAL RESULT CALLED TO, READ BACK BY AND VERIFIED WITH:  LISA KLUTTZ AT 3007 12/23/17 SDR    Streptococcus pyogenes NOT DETECTED NOT DETECTED Final   Acinetobacter baumannii NOT DETECTED NOT DETECTED Final   Enterobacteriaceae species NOT DETECTED NOT DETECTED Final   Enterobacter cloacae complex NOT DETECTED NOT DETECTED Final   Escherichia coli NOT DETECTED NOT DETECTED Final   Klebsiella oxytoca NOT DETECTED NOT DETECTED Final   Klebsiella pneumoniae NOT DETECTED NOT DETECTED Final   Proteus species NOT DETECTED NOT DETECTED Final   Serratia marcescens NOT DETECTED NOT DETECTED Final   Haemophilus influenzae NOT DETECTED NOT DETECTED Final   Neisseria meningitidis NOT DETECTED NOT DETECTED Final   Pseudomonas aeruginosa NOT DETECTED NOT DETECTED Final   Candida albicans NOT DETECTED NOT DETECTED Final   Candida glabrata NOT DETECTED NOT DETECTED Final   Candida krusei NOT DETECTED NOT DETECTED Final   Candida parapsilosis NOT DETECTED NOT DETECTED Final   Candida tropicalis NOT DETECTED NOT DETECTED Final    Comment: Performed at [facility], [address] [place], [place] [address]  MRSA PCR Screening     Status: Abnormal   Collection Time: 12/23/17  8:47 AM  Result Value Ref Range Status   MRSA by PCR POSITIVE (A) NEGATIVE Final    Comment:        The GeneXpert MRSA Assay (FDA approved for NASAL specimens only), is one component of a comprehensive MRSA  colonization surveillance program. It is not intended to diagnose MRSA infection nor to guide or monitor treatment for MRSA infections. RESULT CALLED TO, READ BACK BY AND VERIFIED WITH: C/ MYRA FLOWERS _0  12/23/17 [facility] Performed at [facility], [address] [place], [place] [address]   Culture, respiratory (NON-Expectorated)     Status: None   Collection Time: 12/23/17 12:24 PM  Result Value Ref Range Status   Specimen Description   Final    SPUTUM Performed at [facility], [address], [place], [place] [address]    Special Requests   Final    NONE Performed at [facility], [address] [place], [place] [address]    Gram Stain   Final    ABUNDANT WBC PRESENT,BOTH PMN AND MONONUCLEAR FEW GRAM POSITIVE COCCI    Culture   Final    Consistent with normal respiratory flora. Performed at [facility], [place] [address], [place], [place] [address]    Report Status 12/26/2017 FINAL  Final    Coagulation Studies: No results for input(s): LABPROT, INR in the last 72 hours.  Urinalysis:  Recent Labs  Lab 12/23/17 0051  COLORURINE YELLOW*  LABSPEC 1.016  PHURINE 5.0  GLUCOSEU >=500*  HGBUR MODERATE*  BILIRUBINUR NEGATIVE  KETONESUR 20*  PROTEINUR 30*  NITRITE NEGATIVE  LEUKOCYTESUR NEGATIVE    Lipid Panel:     Component Value Date/Time   CHOL 210 (H) 06/18/2017 1519   CHOL 153 10/19/2012 0414   TRIG 368 (H) 12/23/2017 1012  TRIG 309 (H) 09/24/2013 0412   HDL 42 06/18/2017 1519   HDL 35 (L) 10/19/2012 0414   CHOLHDL 5.0 (H) 06/18/2017 1519   VLDL 72 (H) 10/19/2012 0414   LDLCALC 126 (H) 06/18/2017 1519   LDLCALC 46 10/19/2012 0414    HgbA1C:  Lab Results  Component Value Date   HGBA1C 9.1 10/23/2017    Urine Drug Screen:      Component Value Date/Time   LABOPIA NONE DETECTED 12/23/2017 0051   COCAINSCRNUR NONE DETECTED 12/23/2017 0051   LABBENZ NONE DETECTED 12/23/2017 0051   AMPHETMU NONE DETECTED  12/23/2017 0051   THCU POSITIVE (A) 12/23/2017 0051   LABBARB NONE DETECTED 12/23/2017 0051    Alcohol Level:  Recent Labs  Lab 12/22/17 1956  ETH <10    Imaging: Ct Head Wo Contrast  Result Date: 12/25/2017 CLINICAL DATA:  Altered mental status with diabetic ketoacidosis. EXAM: CT HEAD WITHOUT CONTRAST TECHNIQUE: Contiguous axial images were obtained from the base of the skull through the vertex without intravenous contrast. COMPARISON:  Jan 15, 2016 FINDINGS: Brain: The ventricles are normal in size and configuration. There is no intracranial mass, hemorrhage, extra-axial fluid collection, or midline shift. Gray-white compartments are normal. No acute infarct. Vascular: No hyperdense vessel. No appreciable vascular calcification. Skull: The bony calvarium appears intact. There is mild soft tissue swelling over the right parietal region. No scalp hematoma evident. Sinuses/Orbits: There is opacification throughout multiple ethmoid air cells bilaterally. There is mild mucosal thickening in both inferior frontal sinuses. There is extensive mucosal thickening in the sphenoid sinus regions as well as in both maxillary antra. No air-fluid levels. Orbits appear symmetric bilaterally. Other: Mastoid air cells are clear. IMPRESSION: 1. No intracranial mass or hemorrhage. Gray-white compartments appear normal. 2.  Extensive multifocal paranasal sinus disease. 3. Soft tissue swelling over the right parietal bone. No well-defined scalp hematoma. Underlying bone appears normal. Electronically Signed   By: Lowella Grip III M.D.   On: 12/25/2017 15:23   Dg Chest Port 1 View  Result Date: 12/26/2017 CLINICAL DATA:  Respiratory failure EXAM: PORTABLE CHEST 1 VIEW COMPARISON:  12/25/2017 FINDINGS: Cardiac shadow is stable. Right-sided PICC line is now noted with catheter tip in the mid right atrium. Lungs are well aerated bilaterally with diffuse bilateral infiltrate right greater than left. The overall  appearance is stable given some technical variations in the film. No acute bony abnormality is seen. Old rib fractures are noted on the right. IMPRESSION: Persistent bilateral infiltrates. PICC line as described. Electronically Signed   By: Inez Catalina M.D.   On: 12/26/2017 07:18   Dg Chest Port 1 View  Result Date: 12/25/2017 CLINICAL DATA:  Respiratory failure EXAM: PORTABLE CHEST 1 VIEW COMPARISON:  Three days prior FINDINGS: Extubation with lower lung volumes. Extensive asymmetric airspace disease with interval worsening. No visible cavitation or effusion. Consolidation is particularly marked in the right upper lobe. IMPRESSION: 1. Progressive pneumonia since 3 days prior. 2. Lower volumes after extubation. Electronically Signed   By: Monte Fantasia M.D.   On: 12/25/2017 07:27   [place] Ekg Site Rite  Result Date: 12/25/2017 If Site Rite image not attached, placement could not be confirmed due to current cardiac rhythm.    Assessment/Plan: 32 year old male presenting with confusion.  Noted to be in DKA with positive flu PCR and bilateral chest infiltrates.  White blood cell count elevated.  Renal function decreased as well.  These do appear to be slowly improving.  Hepatic function worsening.  Has had multiple episodes of hypoglycemia.  Suspect etiology of mental status is multifactorial and related to multiple medical issues.  Patient does have positive blood cultures and can not rule out that he has seeded his CNS.   Lastly, patient with some focality on neurological examination despite fact that patient does not want to fully cooperate with neurological examination.  Can not rule out acute infarct.  Head CT reviewed and shows no acute changes. No current evidence of seizure activity.    Recommendations: 1. Would recommend CNS doses of antibiotics for CNS penetration 2. Agree with continuing to address medical issues 3. Due to hepatic function would check ammonia level. 4. MRI of the brain  without contrast, once patient more able to cooperate to rule out stroke.  Would initiate stroke work up if imaging indicative of an acute event.   5. Would start antiplatelet therapy at 369m daily   Case discussed with Dr. RNydia Bouton MD Neurology [phone]/08/2018, 12:07 PM

## 2017-12-26 NOTE — Progress Notes (Addendum)
Busy day. Small stools all day . Patient goes from lucid to confused to hearing things. Cries one minute and is asleep the next. Changed from 40% high flow to 6L nasal cannula to 3 L nasal cannula. Constantly pulls off oxygen and complains constantly. Remains in Sinus Tach. in low 100s. Calls to say he has pooped himself.  Hard to determine wherther he is aware of behavior or not. Weaned off of Precedex. Given 1 dose of Ativan. Late note 4 pm - Given 9 units regular insulin per verbal order of Dr. Celesta Aver.

## 2017-12-26 NOTE — Progress Notes (Signed)
BMP sent at 2000.  Peripheral blood sugar 38.  Central line blood sugar 26.  Will treat with D50.

## 2017-12-26 NOTE — Consult Note (Signed)
[facility]ious [facility]     Reason for Consult:sepsis   Referring Physician: Keane Police Date of Admission:  12/22/2017   Principal Problem:   Severe sepsis ([place]) Active Problems:   Seizures ([place])   DKA (diabetic ketoacidoses) ([place])   Multifocal pneumonia   Acute renal failure superimposed on stage 3 chronic kidney disease (HCC)   Aspiration pneumonia (HCC)   HPI: Maurice Davidson is a 32 y.o. male with poorly controlled DM admitted with DKA and found to have strep PNA bacteremia. Initially intubated for severe resp failure. He was flu + as well.  CXR with Multifocal PNA. By 4/10 was extubated but very lethargic. He had CT neg and had increased dose of ctx from 2 gm to q 12 and added vanco and dexamethasone.  Today he is actually more alert and interactive. He has been seen by neurology.   Past Medical History:  Diagnosis Date  . Anemia   . Anxiety   . Aspiration pneumonia ([place])   . Chronic pain    take Suboxone  . Diabetes mellitus without complication ([place])   . Drug-seeking behavior   . Encephalopathy   . Gastroparesis   . H/O: GI bleed   . HTN (hypertension)   . MRSA (methicillin resistant staph aureus) culture positive   . Opioid dependence ([place])   . Renal disorder   . Seizures ([place])   . Sepsis [facility])    Past Surgical History:  Procedure Laterality Date  . ORTHOPEDIC SURGERY     Social History   Tobacco Use  . Smoking status: Current Some Day Smoker    Packs/day: 1.00    Types: Cigarettes  . Smokeless tobacco: Never Used  Substance Use Topics  . Alcohol use: No    Alcohol/week: 0.0 oz  . Drug use: Yes    Comment: marijuana, cocaine   Family History  Problem Relation Age of Onset  . Clotting disorder Sister        Von Willebrand's disease plus polycythemia vera  . Hypertension Mother     Allergies:  Allergies  Allergen Reactions  . Benzodiazepines     Avoid prescribing. Not a true allergy  . Abilify [Aripiprazole] Rash    Current  antibiotics: Antibiotics Given (last 72 hours)    Date/Time Action Medication Dose Rate   12/23/17 1800 New Bag/Given   cefTRIAXone (ROCEPHIN) 2 g in sodium chloride 0.9 % 100 mL IVPB 2 g 200 mL/hr   12/24/17 0837 Given   oseltamivir (TAMIFLU) capsule 30 mg 30 mg    12/24/17 1652 New Bag/Given   cefTRIAXone (ROCEPHIN) 2 g in sodium chloride 0.9 % 100 mL IVPB 2 g 200 mL/hr   12/25/17 1801 New Bag/Given   vancomycin (VANCOCIN) IVPB 1000 mg/200 mL premix 1,000 mg 200 mL/hr   12/25/17 2156 New Bag/Given   cefTRIAXone (ROCEPHIN) 2 g in sodium chloride 0.9 % 100 mL IVPB 2 g 200 mL/hr   12/26/17 0115 New Bag/Given   vancomycin (VANCOCIN) IVPB 750 mg/150 ml premix 750 mg 150 mL/hr      MEDICATIONS: . aspirin EC  81 mg Oral Daily  . budesonide (PULMICORT) nebulizer solution  0.25 mg Nebulization BID  . chlorhexidine gluconate (MEDLINE KIT)  15 mL Mouth Rinse BID  . Chlorhexidine Gluconate Cloth  6 each Topical Q0600  . dexamethasone  2 mg Intravenous Q6H  . gabapentin  800 mg Oral TID  . heparin  5,000 Units Subcutaneous Q8H  . insulin aspart  3-9 Units Subcutaneous  Q4H  . insulin glargine  10 Units Subcutaneous QHS  . ipratropium-albuterol  3 mL Nebulization Q6H  . multivitamin  15 mL Per Tube Daily  . mupirocin ointment  1 application Nasal BID  . senna-docusate  1 tablet Per Tube BID  . sertraline  100 mg Oral Daily  . sodium chloride flush  10-40 mL Intracatheter Q12H    Review of Systems - unable to obtain OBJECTIVE: Temp:  [99 F (37.2 C)-100.6 F (38.1 C)] 99.7 F (37.6 C) (04/12 1200) Pulse Rate:  [93-111] 97 (04/12 1200) Resp:  [16-31] 16 (04/12 1200) BP: (110-147)/(72-100) 147/96 (04/12 1200) SpO2:  [81 %-100 %] 98 % (04/12 1154) FiO2 (%):  [40 %-45 %] 45 % (04/12 1154) Weight:  [40.6 kg (89 lb 8.1 oz)] 40.6 kg (89 lb 8.1 oz) (04/12 0500) Physical Exam  Constitutional: arousable, able to answer questions but slowed,  HENT: anicteric Mouth/Throat: Oropharynx is  clear and moist. No oropharyngeal exudate.  Cardiovascular: Normal rate, regular rhythm and normal heart sounds.  Pulmonary/Chest: poor air movemen Abdominal: Soft. Bowel sounds are normal. He exhibits no distension. There is no tenderness.  Lymphadenopathy:  He has no cervical adenopathy.  Neurological: awake, but sleepy, able to tell me name and htat he is in hospital  Skin: Skin is warm and dry. No rash noted. No erythema.  Psychiatric: He has a normal mood and affect. His behavior is normal.     LABS: Results for orders placed or performed during the hospital encounter of 12/22/17 (from the past 48 hour(s))  Glucose, capillary     Status: None   Collection Time: 12/24/17  4:40 PM  Result Value Ref Range   Glucose-Capillary 85 65 - 99 mg/dL  Glucose, capillary     Status: Abnormal   Collection Time: 12/24/17  7:38 PM  Result Value Ref Range   Glucose-Capillary 35 (LL) 65 - 99 mg/dL  Glucose, capillary     Status: Abnormal   Collection Time: 12/24/17  8:01 PM  Result Value Ref Range   Glucose-Capillary 142 (H) 65 - 99 mg/dL  Glucose, capillary     Status: Abnormal   Collection Time: 12/24/17 11:29 PM  Result Value Ref Range   Glucose-Capillary 19 (LL) 65 - 99 mg/dL  Glucose, capillary     Status: Abnormal   Collection Time: 12/24/17 11:48 PM  Result Value Ref Range   Glucose-Capillary 136 (H) 65 - 99 mg/dL  Glucose, capillary     Status: Abnormal   Collection Time: 12/25/17  3:45 AM  Result Value Ref Range   Glucose-Capillary 12 (LL) 65 - 99 mg/dL  Glucose, capillary     Status: Abnormal   Collection Time: 12/25/17  4:09 AM  Result Value Ref Range   Glucose-Capillary 131 (H) 65 - 99 mg/dL  Glucose, capillary     Status: Abnormal   Collection Time: 12/25/17  5:26 AM  Result Value Ref Range   Glucose-Capillary 48 (L) 65 - 99 mg/dL  Glucose, capillary     Status: Abnormal   Collection Time: 12/25/17  5:46 AM  Result Value Ref Range   Glucose-Capillary 163 (H) 65 - 99  mg/dL  CBC     Status: Abnormal   Collection Time: 12/25/17  6:45 AM  Result Value Ref Range   WBC 14.2 (H) 3.8 - 10.6 K/uL   RBC 3.10 (L) 4.40 - 5.90 MIL/uL   Hemoglobin 9.3 (L) 13.0 - 18.0 g/dL   HCT 27.6 (L) 40.0 - 52.0 %  MCV 89.1 80.0 - 100.0 fL   MCH 30.1 26.0 - 34.0 pg   MCHC 33.8 32.0 - 36.0 g/dL   RDW 14.0 11.5 - 14.5 %   Platelets 147 (L) 150 - 440 K/uL    Comment: Performed at [facility], [address] [place], [place] [address]  Comprehensive metabolic panel     Status: Abnormal   Collection Time: 12/25/17  6:45 AM  Result Value Ref Range   Sodium 148 (H) 135 - 145 mmol/L   Potassium 2.8 (L) 3.5 - 5.1 mmol/L   Chloride 110 101 - 111 mmol/L   CO2 31 22 - 32 mmol/L   Glucose, Bld 104 (H) 65 - 99 mg/dL   BUN 65 (H) 6 - 20 mg/dL   Creatinine, Ser 2.12 (H) 0.61 - 1.24 mg/dL   Calcium 7.6 (L) 8.9 - 10.3 mg/dL   Total Protein 6.1 (L) 6.5 - 8.1 g/dL   Albumin 2.1 (L) 3.5 - 5.0 g/dL   AST 104 (H) 15 - 41 U/L   ALT 49 17 - 63 U/L   Alkaline Phosphatase 323 (H) 38 - 126 U/L   Total Bilirubin 0.6 0.3 - 1.2 mg/dL   GFR calc non Af Amer 39 (L) >60 mL/min   GFR calc Af Amer 46 (L) >60 mL/min    Comment: (NOTE) The eGFR has been calculated using the CKD EPI equation. This calculation has not been validated in all clinical situations. eGFR's persistently <60 mL/min signify possible Chronic Kidney Disease.    Anion gap 7 5 - 15    Comment: Performed at [facility], [address] [place], [place] [address]  Glucose, capillary     Status: None   Collection Time: 12/25/17  6:53 AM  Result Value Ref Range   Glucose-Capillary 79 65 - 99 mg/dL  Glucose, capillary     Status: Abnormal   Collection Time: 12/25/17  7:57 AM  Result Value Ref Range   Glucose-Capillary 64 (L) 65 - 99 mg/dL  Glucose, capillary     Status: Abnormal   Collection Time: 12/25/17 12:05 PM  Result Value Ref Range   Glucose-Capillary <10 (LL) 65 - 99 mg/dL  Glucose, capillary      Status: Abnormal   Collection Time: 12/25/17 12:18 PM  Result Value Ref Range   Glucose-Capillary <10 (LL) 65 - 99 mg/dL  Glucose, capillary     Status: Abnormal   Collection Time: 12/25/17 12:23 PM  Result Value Ref Range   Glucose-Capillary <10 (LL) 65 - 99 mg/dL  Glucose, capillary     Status: Abnormal   Collection Time: 12/25/17 12:44 PM  Result Value Ref Range   Glucose-Capillary 247 (H) 65 - 99 mg/dL  Glucose, capillary     Status: None   Collection Time: 12/25/17 12:47 PM  Result Value Ref Range   Glucose-Capillary 91 65 - 99 mg/dL  Glucose, capillary     Status: Abnormal   Collection Time: 12/25/17  2:21 PM  Result Value Ref Range   Glucose-Capillary 48 (L) 65 - 99 mg/dL  Glucose, capillary     Status: Abnormal   Collection Time: 12/25/17  4:01 PM  Result Value Ref Range   Glucose-Capillary 101 (H) 65 - 99 mg/dL  Glucose, capillary     Status: Abnormal   Collection Time: 12/25/17  6:11 PM  Result Value Ref Range   Glucose-Capillary 25 (LL) 65 - 99 mg/dL   Comment 1 Repeat Test   Glucose, capillary  Status: Abnormal   Collection Time: 12/25/17  6:16 PM  Result Value Ref Range   Glucose-Capillary >600 (HH) 65 - 99 mg/dL  Glucose, capillary     Status: Abnormal   Collection Time: 12/25/17  6:17 PM  Result Value Ref Range   Glucose-Capillary 480 (H) 65 - 99 mg/dL  Glucose, capillary     Status: Abnormal   Collection Time: 12/25/17  6:19 PM  Result Value Ref Range   Glucose-Capillary 478 (H) 65 - 99 mg/dL  Glucose, capillary     Status: Abnormal   Collection Time: 12/25/17  6:24 PM  Result Value Ref Range   Glucose-Capillary 27 (LL) 65 - 99 mg/dL  Basic metabolic panel     Status: Abnormal   Collection Time: 12/25/17  6:30 PM  Result Value Ref Range   Sodium 147 (H) 135 - 145 mmol/L   Potassium 3.2 (L) 3.5 - 5.1 mmol/L   Chloride 108 101 - 111 mmol/L   CO2 33 (H) 22 - 32 mmol/L   Glucose, Bld 31 (LL) 65 - 99 mg/dL    Comment: CRITICAL RESULT CALLED TO, READ  BACK BY AND VERIFIED WITH SHAUNA HEDRICK @ 1717 ON 12/25/2017 BY CAF    BUN 52 (H) 6 - 20 mg/dL   Creatinine, Ser 1.75 (H) 0.61 - 1.24 mg/dL   Calcium 7.8 (L) 8.9 - 10.3 mg/dL   GFR calc non Af Amer 50 (L) >60 mL/min   GFR calc Af Amer 57 (L) >60 mL/min    Comment: (NOTE) The eGFR has been calculated using the CKD EPI equation. This calculation has not been validated in all clinical situations. eGFR's persistently <60 mL/min signify possible Chronic Kidney Disease.    Anion gap 6 5 - 15    Comment: Performed at [facility], [address] [place], [place] [address]  Phosphorus     Status: Abnormal   Collection Time: 12/25/17  6:30 PM  Result Value Ref Range   Phosphorus 1.9 (L) 2.5 - 4.6 mg/dL    Comment: Performed at [facility], [address] [place], [place] [address]  Glucose, capillary     Status: Abnormal   Collection Time: 12/25/17  7:53 PM  Result Value Ref Range   Glucose-Capillary 108 (H) 65 - 99 mg/dL  Glucose, capillary     Status: Abnormal   Collection Time: 12/25/17 10:04 PM  Result Value Ref Range   Glucose-Capillary 41 (LL) 65 - 99 mg/dL  Glucose, capillary     Status: Abnormal   Collection Time: 12/25/17 10:34 PM  Result Value Ref Range   Glucose-Capillary 127 (H) 65 - 99 mg/dL  Glucose, capillary     Status: Abnormal   Collection Time: 12/26/17  2:16 AM  Result Value Ref Range   Glucose-Capillary 142 (H) 65 - 99 mg/dL  Glucose, capillary     Status: Abnormal   Collection Time: 12/26/17  4:01 AM  Result Value Ref Range   Glucose-Capillary 194 (H) 65 - 99 mg/dL  CBC     Status: Abnormal   Collection Time: 12/26/17  5:01 AM  Result Value Ref Range   WBC 12.3 (H) 3.8 - 10.6 K/uL   RBC 2.97 (L) 4.40 - 5.90 MIL/uL   Hemoglobin 9.0 (L) 13.0 - 18.0 g/dL   HCT 26.6 (L) 40.0 - 52.0 %   MCV 89.4 80.0 - 100.0 fL   MCH 30.4 26.0 - 34.0 pg   MCHC 34.0 32.0 - 36.0 g/dL   RDW  13.7 11.5 - 14.5 %   Platelets 130 (L) 150 - 440 K/uL     Comment: Performed at [facility], [address] [place], [place] [address]  Comprehensive metabolic panel     Status: Abnormal   Collection Time: 12/26/17  5:01 AM  Result Value Ref Range   Sodium 143 135 - 145 mmol/L   Potassium 4.1 3.5 - 5.1 mmol/L   Chloride 105 101 - 111 mmol/L   CO2 31 22 - 32 mmol/L   Glucose, Bld 229 (H) 65 - 99 mg/dL   BUN 43 (H) 6 - 20 mg/dL   Creatinine, Ser 1.59 (H) 0.61 - 1.24 mg/dL   Calcium 7.8 (L) 8.9 - 10.3 mg/dL   Total Protein 6.7 6.5 - 8.1 g/dL   Albumin 2.3 (L) 3.5 - 5.0 g/dL   AST 62 (H) 15 - 41 U/L   ALT 49 17 - 63 U/L   Alkaline Phosphatase 342 (H) 38 - 126 U/L   Total Bilirubin 0.9 0.3 - 1.2 mg/dL   GFR calc non Af Amer 56 (L) >60 mL/min   GFR calc Af Amer >60 >60 mL/min    Comment: (NOTE) The eGFR has been calculated using the CKD EPI equation. This calculation has not been validated in all clinical situations. eGFR's persistently <60 mL/min signify possible Chronic Kidney Disease.    Anion gap 7 5 - 15    Comment: Performed at [facility], [address] [place], [place] [address]  Procalcitonin     Status: None   Collection Time: 12/26/17  5:01 AM  Result Value Ref Range   Procalcitonin 3.88 ng/mL    Comment:        Interpretation: PCT > 2 ng/mL: Systemic infection (sepsis) is likely, unless other causes are known. (NOTE)       Sepsis PCT Algorithm           Lower Respiratory Tract                                      Infection PCT Algorithm    ----------------------------     ----------------------------         PCT < 0.25 ng/mL                PCT < 0.10 ng/mL         Strongly encourage             Strongly discourage   discontinuation of antibiotics    initiation of antibiotics    ----------------------------     -----------------------------       PCT 0.25 - 0.50 ng/mL            PCT 0.10 - 0.25 ng/mL               OR       >80% decrease in PCT            Discourage initiation of                                             antibiotics      Encourage discontinuation           of antibiotics    ----------------------------     -----------------------------  PCT >= 0.50 ng/mL              PCT 0.26 - 0.50 ng/mL               AND       <80% decrease in PCT              Encourage initiation of                                             antibiotics       Encourage continuation           of antibiotics    ----------------------------     -----------------------------        PCT >= 0.50 ng/mL                  PCT > 0.50 ng/mL               AND         increase in PCT                  Strongly encourage                                      initiation of antibiotics    Strongly encourage escalation           of antibiotics                                     -----------------------------                                           PCT <= 0.25 ng/mL                                                 OR                                        > 80% decrease in PCT                                     Discontinue / Do not initiate                                             antibiotics Performed at [facility], [address] [place], [place] [address]   Phosphorus     Status: None   Collection Time: 12/26/17  5:01 AM  Result Value Ref Range   Phosphorus 3.2 2.5 - 4.6 mg/dL    Comment: Performed at [facility], [address] [place], [place] [address]  Magnesium     Status: Abnormal  Collection Time: 12/26/17  5:01 AM  Result Value Ref Range   Magnesium 1.3 (L) 1.7 - 2.4 mg/dL    Comment: Performed at [facility], [address] [place], [place] [address]  Glucose, capillary     Status: Abnormal   Collection Time: 12/26/17  7:07 AM  Result Value Ref Range   Glucose-Capillary 269 (H) 65 - 99 mg/dL  Glucose, capillary     Status: Abnormal   Collection Time: 12/26/17 11:24 AM  Result Value Ref Range   Glucose-Capillary 313 (H) 65 - 99 mg/dL    No components found for: ESR, C REACTIVE PROTEIN MICRO: Recent Results (from the past 720 hour(s))  Blood Culture (routine x 2)     Status: Abnormal   Collection Time: 12/22/17  9:31 PM  Result Value Ref Range Status   Specimen Description   Final    BLOOD RIGHT ANTECUBITAL Performed at [facility], [address], [place], [place] [address]    Special Requests   Final    BOTTLES DRAWN AEROBIC AND ANAEROBIC Blood Culture results may not be optimal due to an excessive volume of blood received in culture bottles Performed at [facility], [address] [place], [place] [address]    Culture  Setup Time   Final    Organism ID to follow [place] TO, READ BACK BY AND VERIFIED WITH: LISA KLUTTZ AT 5462 12/23/17 [place] Performed at [facility], [address] [place], [place] [address]    Culture (A)  Final    STREPTOCOCCUS PNEUMONIAE SUSCEPTIBILITIES PERFORMED ON PREVIOUS CULTURE WITHIN THE LAST 5 DAYS. Performed at [facility], [address], [place], [place] [address]    Report Status 12/25/2017 FINAL  Final  Blood Culture (routine x 2)     Status: Abnormal   Collection Time: 12/22/17  9:31 PM  Result Value Ref Range Status   Specimen Description   Final    BLOOD BLOOD LEFT FOREARM Performed at [facility], [address], [place], [place] [address]    Special Requests   Final    BOTTLES DRAWN AEROBIC AND ANAEROBIC Blood Culture adequate volume Performed at [facility], [address] [place], [place] [address]    Culture  Setup Time   Final    GRAM POSITIVE COCCI IN BOTH AEROBIC AND ANAEROBIC BOTTLES CRITICAL VALUE NOTED.  VALUE IS CONSISTENT WITH PREVIOUSLY REPORTED AND CALLED VALUE. Performed at [facility], [address] [place], [place] [address]    Culture STREPTOCOCCUS PNEUMONIAE (A)  Final   Report Status 12/25/2017  FINAL  Final   Organism ID, Bacteria STREPTOCOCCUS PNEUMONIAE  Final      Susceptibility   Streptococcus pneumoniae - MIC*    ERYTHROMYCIN >=8 RESISTANT Resistant     LEVOFLOXACIN 1 SENSITIVE Sensitive     PENICILLIN (meningitis) 1 RESISTANT Resistant     PENICILLIN (non-meningitis) 1 SENSITIVE Sensitive     CEFTRIAXONE (non-meningitis) 1 SENSITIVE Sensitive     CEFTRIAXONE (meningitis) 1 INTERMEDIATE Intermediate     * STREPTOCOCCUS PNEUMONIAE  Blood Culture ID Panel (Reflexed)     Status: Abnormal   Collection Time: 12/22/17  9:31 PM  Result Value Ref Range Status   Enterococcus species NOT DETECTED NOT DETECTED Final   Listeria monocytogenes NOT DETECTED NOT DETECTED Final   Staphylococcus species NOT DETECTED NOT DETECTED Final   Staphylococcus aureus NOT DETECTED NOT DETECTED  Final   Streptococcus species DETECTED (A) NOT DETECTED Final    Comment: CRITICAL RESULT CALLED TO, READ BACK BY AND VERIFIED WITH:  LISA KLUTTZ AT 9179 12/23/17 SDR    Streptococcus agalactiae NOT DETECTED NOT DETECTED Final   Streptococcus pneumoniae DETECTED (A) NOT DETECTED Final    Comment: CRITICAL RESULT CALLED TO, READ BACK BY AND VERIFIED WITH:  LISA KLUTTZ AT 1505 12/23/17 SDR    Streptococcus pyogenes NOT DETECTED NOT DETECTED Final   Acinetobacter baumannii NOT DETECTED NOT DETECTED Final   Enterobacteriaceae species NOT DETECTED NOT DETECTED Final   Enterobacter cloacae complex NOT DETECTED NOT DETECTED Final   Escherichia coli NOT DETECTED NOT DETECTED Final   Klebsiella oxytoca NOT DETECTED NOT DETECTED Final   Klebsiella pneumoniae NOT DETECTED NOT DETECTED Final   Proteus species NOT DETECTED NOT DETECTED Final   Serratia marcescens NOT DETECTED NOT DETECTED Final   Haemophilus influenzae NOT DETECTED NOT DETECTED Final   Neisseria meningitidis NOT DETECTED NOT DETECTED Final   Pseudomonas aeruginosa NOT DETECTED NOT DETECTED Final   Candida albicans NOT DETECTED NOT DETECTED Final    Candida glabrata NOT DETECTED NOT DETECTED Final   Candida krusei NOT DETECTED NOT DETECTED Final   Candida parapsilosis NOT DETECTED NOT DETECTED Final   Candida tropicalis NOT DETECTED NOT DETECTED Final    Comment: Performed at [facility], [address] [place], [place] [address]  MRSA PCR Screening     Status: Abnormal   Collection Time: 12/23/17  8:47 AM  Result Value Ref Range Status   MRSA by PCR POSITIVE (A) NEGATIVE Final    Comment:        The GeneXpert MRSA Assay (FDA approved for NASAL specimens only), is one component of a comprehensive MRSA colonization surveillance program. It is not intended to diagnose MRSA infection nor to guide or monitor treatment for MRSA infections. RESULT CALLED TO, READ BACK BY AND VERIFIED WITH: C/ MYRA FLOWERS '@1020'  12/23/17 [facility] Performed at [facility], [address] [place], [place] [address]   Culture, respiratory (NON-Expectorated)     Status: None   Collection Time: 12/23/17 12:24 PM  Result Value Ref Range Status   Specimen Description   Final    SPUTUM Performed at [facility], [address] [place], [place] [address]    Special Requests   Final    NONE Performed at [facility], [address] [place], [place] [address]    Gram Stain   Final    ABUNDANT WBC PRESENT,BOTH PMN AND MONONUCLEAR FEW GRAM POSITIVE COCCI    Culture   Final    Consistent with normal respiratory flora. Performed at [facility], [place] [place], [place] [address]    Report Status 12/26/2017 FINAL  Final    IMAGING: Dg Abd 1 View  Result Date: 12/23/2017 CLINICAL DATA:  Orogastric tube placement EXAM: ABDOMEN - 1 VIEW COMPARISON:  December 22, 2017 FINDINGS: Orogastric tube tip and side port in proximal stomach. Visualized bowel gas pattern is unremarkable. No evident bowel obstruction or free air. Endotracheal tube tip is 5.1 cm above the carina. No pneumothorax. Multiple foci of airspace  disease noted bilaterally. IMPRESSION: Orogastric tube tip and side port in proximal stomach. Visualized bowel gas pattern unremarkable. Multiple foci of airspace disease in the lungs. Electronically Signed   By: Lowella Grip III M.D.   On: 12/23/2017 10:58   Ct Head Wo Contrast  Result Date: 12/25/2017 CLINICAL DATA:  Altered mental status with diabetic ketoacidosis. EXAM: CT HEAD WITHOUT CONTRAST TECHNIQUE: Contiguous axial images were obtained from the base of the skull through the vertex without intravenous contrast. COMPARISON:  Jan 15, 2016 FINDINGS: Brain: The ventricles are normal in size and configuration. There is no intracranial mass, hemorrhage, extra-axial fluid collection, or midline shift. Gray-white compartments are normal. No acute infarct. Vascular: No hyperdense vessel. No appreciable vascular calcification. Skull: The bony calvarium appears intact. There is mild soft tissue swelling over the right parietal region. No scalp hematoma evident. Sinuses/Orbits: There is opacification throughout multiple ethmoid air cells bilaterally. There is mild mucosal thickening in both inferior frontal sinuses. There is extensive mucosal thickening in the sphenoid sinus regions as well as in both maxillary antra. No air-fluid levels. Orbits appear symmetric bilaterally. Other: Mastoid air cells are clear. IMPRESSION: 1. No intracranial mass or hemorrhage. Gray-white compartments appear normal. 2.  Extensive multifocal paranasal sinus disease. 3. Soft tissue swelling over the right parietal bone. No well-defined scalp hematoma. Underlying bone appears normal. Electronically Signed   By: Lowella Grip III M.D.   On: 12/25/2017 15:23   Dg Chest Port 1 View  Result Date: 12/26/2017 CLINICAL DATA:  Respiratory failure EXAM: PORTABLE CHEST 1 VIEW COMPARISON:  12/25/2017 FINDINGS: Cardiac shadow is stable. Right-sided PICC line is now noted with catheter tip in the mid right atrium. Lungs are well  aerated bilaterally with diffuse bilateral infiltrate right greater than left. The overall appearance is stable given some technical variations in the film. No acute bony abnormality is seen. Old rib fractures are noted on the right. IMPRESSION: Persistent bilateral infiltrates. PICC line as described. Electronically Signed   By: Inez Catalina M.D.   On: 12/26/2017 07:18   Dg Chest Port 1 View  Result Date: 12/25/2017 CLINICAL DATA:  Respiratory failure EXAM: PORTABLE CHEST 1 VIEW COMPARISON:  Three days prior FINDINGS: Extubation with lower lung volumes. Extensive asymmetric airspace disease with interval worsening. No visible cavitation or effusion. Consolidation is particularly marked in the right upper lobe. IMPRESSION: 1. Progressive pneumonia since 3 days prior. 2. Lower volumes after extubation. Electronically Signed   By: Monte Fantasia M.D.   On: 12/25/2017 07:27   Dg Chest Portable 1 View  Result Date: 12/22/2017 CLINICAL DATA:  ETT placement EXAM: PORTABLE CHEST 1 VIEW COMPARISON:  12/22/2017, 01/15/2016 FINDINGS: Endotracheal tube tip is about 4.6 cm superior to the carina. Esophageal tube side-port overlies the distal esophagus. Worsening right greater than left ground-glass opacity and airspace disease. Normal heart size. No pneumothorax. IMPRESSION: 1. Endotracheal tube tip about 4.6 cm superior to carina 2. Esophageal tube side port overlies distal esophagus, suggest further advancement for more optimal positioning 3. Worsening of multifocal airspace disease, right greater than left consistent with multifocal pneumonia Electronically Signed   By: Donavan Foil M.D.   On: 12/22/2017 23:21   Dg Chest Portable 1 View  Result Date: 12/22/2017 CLINICAL DATA:  Combative, agitation, hyperglycemia. History of diabetes, sepsis. EXAM: PORTABLE CHEST 1 VIEW COMPARISON:  Chest radiograph Jan 15, 2016 FINDINGS: Cardiomediastinal silhouette is normal. Multifocal consolidation bilateral lungs without  pleural effusion. No pneumothorax. Soft tissue planes and included osseous structures are nonsuspicious. IMPRESSION: Multifocal pneumonia, potential septic emboli etiology. Electronically Signed   By: Elon Alas M.D.   On: 12/22/2017 20:45   Dg Abd Portable 1 View  Result Date: 12/22/2017 CLINICAL DATA:  NG tube EXAM: PORTABLE ABDOMEN - 1 VIEW COMPARISON:  None. FINDINGS: Multifocal pulmonary infiltrates. Esophageal tube tip  overlies the proximal stomach, side-port projects over the distal esophagus IMPRESSION: Side-port of the esophageal tube overlies the distal esophagus, suggest further advancement for more optimal positioning Electronically Signed   By: Donavan Foil M.D.   On: 12/22/2017 23:21   [place] Ekg Site Rite  Result Date: 12/25/2017 If Site Rite image not attached, placement could not be confirmed due to current cardiac rhythm.   Assessment:   Maurice Davidson is a 32 y.o. 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 Initially intubated but now on Hi flo O2. Was very lethargic after extubation and there was concern for meningitis, however much more alert today. CT head neg except for extensive sinus disease. PC improving, HIV neg.  He was started on vanco in addition to ctx which was increased from qd to q 12 hours on 4/11 and dexamethasome was added.  His Strep PNA is relatively resistant especially if needed to treat meningitis.   Recommendations Agree with increased dose of ctx, and addition of vanco. Agree with dexamethasone as high concern for meningitis - however can dc it if sugars become an issue.  Check echo - although unlikely Strep Pna can cause endocarditis. Agree with MRI.  Thank you very much for allowing me to participate in the care of this very ill patient. Please call with questions.   Cheral Marker. Ola Spurr, MD

## 2017-12-26 NOTE — Progress Notes (Signed)
PULMONARY / CRITICAL CARE MEDICINE   Name: Maurice Davidson MRN: [id number] DOB: 07/09/1985    ADMISSION DATE:  12/22/2017  PT PROFILE: 73 M with DM I adm 04/08 via ED with AMS, combativeness, DKA. Intubated in ED. Flu PCR positive and BC positive for S pneumoniae  MAJOR EVENTS/TEST RESULTS: 04/08 As above. Treated with DKA protocol. Stabilized on vent. PAD protocol 04/10 increasing agitation and delirium.  Dexmedetomidine infusion initiated.  Recurrent hypoglycemic episodes 04/11 severe lethargy.  Dexmedetomidine discontinued.  Lethargy persisted.  Continued to have recurrent episodes of hypoglycemia despite D10 infusion. Neurology consultation requested. ID consultation requested. Concern for meningitis > Ceftriaxone dose increased, vancomycin resumed, dexamethasone initiated 04/11 CT head: No acute intracranial findings  INDWELLING DEVICES:: ETT 04/08 >> 04/10 RUE PICC 04/11 >>   MICRO DATA: MRSA PCR 04/08 >> POS Blood 04/08 >> S pneumoniae Resp 04/09 >>    ANTIMICROBIALS:  Pip-tazo 04/08 X 1  Oseltamivir 04/08 >> 04/10 Vanc 04/08 >> 04/09, 04/11 >>  Ceftriaxine 04/08 >>    SUBJECTIVE:  Severe lethargy this AM.  Dexmedetomidine discontinued.  Lethargy persisted.  Continued to have recurrent episodes of hypoglycemia despite D10 infusion. Concern for meningitis > Ceftriaxone dose increased, vancomycin resumed, dexamethasone initiated  VITAL SIGNS: BP 136/90   Pulse 94   Temp 99.5 F (37.5 C)   Resp (!) 25   Ht 5\' 9"  (1.753 m)   Wt 89 lb 8.1 oz (40.6 kg)   SpO2 98%   BMI 13.22 kg/m   HEMODYNAMICS:  No hemodynamic compromise  OXYGEN: HFO 45% / 40 flow  INTAKE / OUTPUT: I/O last 3 completed shifts: In: 2781.5 [I.V.:1921.5; Other:10; IV Piggyback:850] Out: 6695 [WJXBJ:4782]  PHYSICAL EXAMINATION: General: awake and cooperative Neuro: AAO x 2, no motor deficits but slight left neglect HEENT: NCAT, sclerae white Cardiovascular: Tachy, regular, no M Lungs:  Bilateral rhonchi, no wheezes Abdomen: Soft, no palpable masses, + BS Extremities: Warm, no edema  LABS:  BMET Recent Labs  Lab 12/25/17 0645 12/25/17 1830 12/26/17 0501  NA 148* 147* 143  K 2.8* 3.2* 4.1  CL 110 108 105  CO2 31 33* 31  BUN 65* 52* 43*  CREATININE 2.12* 1.75* 1.59*  GLUCOSE 104* 31* 229*    Electrolytes Recent Labs  Lab 12/23/17 1653 12/24/17 0511 12/25/17 0645 12/25/17 1830 12/26/17 0501  CALCIUM 6.9* 7.1* 7.6* 7.8* 7.8*  MG 2.5* 2.6*  --   --  1.3*  PHOS <1.0* 1.4*  --  1.9* 3.2    CBC Recent Labs  Lab 12/23/17 0535 12/25/17 0645 12/26/17 0501  WBC 7.2 14.2* 12.3*  HGB 11.3* 9.3* 9.0*  HCT 35.0* 27.6* 26.6*  PLT 277 147* 130*    Coag's No results for input(s): APTT, INR in the last 168 hours.  Sepsis Markers Recent Labs  Lab 12/23/17 0235 12/23/17 0248 12/26/17 0501  LATICACIDVEN 1.4  --   --   PROCALCITON  --  17.33 3.88    ABG Recent Labs  Lab 12/23/17 0031 12/23/17 0207 12/23/17 0532  PHART <6.900* 7.16* 7.33*  PCO2ART 45 54* 48  PO2ART 333* 430* 115*    Liver Enzymes Recent Labs  Lab 12/22/17 1956 12/23/17 2108 12/25/17 0645 12/26/17 0501  AST 42*  --  104* 62*  ALT 17  --  49 49  ALKPHOS 136*  --  323* 342*  BILITOT 1.8*  --  0.6 0.9  ALBUMIN 2.8* 1.9* 2.1* 2.3*    Cardiac Enzymes No results for input(s): TROPONINI,  PROBNP in the last 168 hours.  Glucose Recent Labs  Lab 12/25/17 2204 12/25/17 2234 12/26/17 0216 12/26/17 0401 12/26/17 0707 12/26/17 1124  GLUCAP 41* 127* 142* 194* 269* 313*    CXR: Increased RUL consolidation increasing infiltrate in RLL, patchy opacities on L    ASSESSMENT / PLAN: Acute hypoxemic respiratory failure Pneumococcal pneumonia Pneumococcal bacteremia Influenza A + Could not R/O pneumococcal meningitis- ABX adjusted to cover Agitated delirium, resolved Severe lethargy, stupor- resolved DKA, resolved Recurrent hypoglycemia- much improved Protein-calorie  malnutrition- moderate Chronic pain syndrome History of opioid abuse Acute kidney Injury- improving  Cont supplemental oxygen and wean to maintain SPO2 >90% Continue nebulized bronchodilators Monitor hemodynamics Monitor BMET intermittently Monitor I/Os Correct electrolytes as indicated  Start Diabetic diety Monitor temp, WBC count Micro and abx as above - Ceftriaxone dose increased, vancomycin resumed 04/11 Dexamethasone initiated 04/11 Cont Lantus - increase as diet has been started Cont SSI  Cont maintenance IVFs with D10 until able to take POs Discussed with Neurology- no need for LP as patient is already on appropriate ABX and improving however, she was concern about a small stroke, Patient is unable to tolerate an MRI now so ASA recommended Echocardiogram ordered Wean Precedex to off   Family: Grandmother updated ( limitedly) at bedside Disp: out of bed  [name], MD PCCM service Pager [phone] 12/26/2017, 11:59 AM

## 2017-12-26 NOTE — Progress Notes (Addendum)
Critical value of blood sugar 42.

## 2017-12-26 NOTE — Progress Notes (Signed)
*  PRELIMINARY RESULTS* Echocardiogram 2D Echocardiogram has been performed.  Maurice Davidson Hoge 12/26/2017, 11:18 AM

## 2017-12-26 NOTE — Progress Notes (Signed)
Pharmacy Antibiotic Note  Maurice Davidson is a 32 y.o. male admitted on 12/22/2017 with S. Pneumoniae bacteremia and concern for meningitis.  Pharmacy has been consulted for vancomycin and ceftriaxone dosing.  Plan: Continue vancomycin 750mg  IV Q24hr for goal trough of 15-20. Will obtain trough prior to dose on 4/14.  Continue ceftriaxone 2g IV Q12hr to cover possible meningitis.   Height: 5\' 9"  (175.3 cm) Weight: 89 lb 8.1 oz (40.6 kg) IBW/kg (Calculated) : 70.7  Temp (24hrs), Avg:99.9 F (37.7 C), Min:99.1 F (37.3 C), Max:100.6 F (38.1 C)  Recent Labs  Lab 12/22/17 1956 12/23/17 0235 12/23/17 0240 12/23/17 0535  12/23/17 1653 12/24/17 0511 12/25/17 0645 12/25/17 1830 12/26/17 0501  WBC 10.3  --  7.5 7.2  --   --   --  14.2*  --  12.3*  CREATININE 3.12*  --  3.14* 2.89*   < > 2.68* 2.70* 2.12* 1.75* 1.59*  LATICACIDVEN  --  1.4  --   --   --   --   --   --   --   --    < > = values in this interval not displayed.    Estimated Creatinine Clearance: 37.9 mL/min (A) (by C-G formula based on SCr of 1.59 mg/dL (H)).    Allergies  Allergen Reactions  . Benzodiazepines     Avoid prescribing. Not a true allergy  . Abilify [Aripiprazole] Rash    Antimicrobials this admission: Cefepime 4/08 >> 4/9 Vancomycin 4/08 x 1, 4/11 >>  Oseltamivir 4/09 >> 4/13 Ceftriaxone 4/9 >>    Dose adjustments this admission: 4/11 Ceftriaxone adjusted from Q12hr to Q24hr dosing to cover possible meningitis.   Microbiology results: 4/8 BCx: Strep Pneumo  4/12 BCx: pending  4/9 Sputum: normal flora  4/9  MRSA PCR: positive  Thank you for allowing pharmacy to be a part of this patient's care.  Charlett Nose, PharmD Pharmacy Resident 12/26/2017 5:19 PM

## 2017-12-27 DIAGNOSIS — E1065 Type 1 diabetes mellitus with hyperglycemia: Secondary | ICD-10-CM

## 2017-12-27 DIAGNOSIS — A491 Streptococcal infection, unspecified site: Secondary | ICD-10-CM

## 2017-12-27 DIAGNOSIS — R6520 Severe sepsis without septic shock: Secondary | ICD-10-CM

## 2017-12-27 DIAGNOSIS — N179 Acute kidney failure, unspecified: Secondary | ICD-10-CM

## 2017-12-27 DIAGNOSIS — A419 Sepsis, unspecified organism: Secondary | ICD-10-CM

## 2017-12-27 LAB — C DIFFICILE QUICK SCREEN W PCR REFLEX
C DIFFICILE (CDIFF) TOXIN: NEGATIVE
C DIFFICLE (CDIFF) ANTIGEN: NEGATIVE
C Diff interpretation: NOT DETECTED

## 2017-12-27 LAB — GLUCOSE, CAPILLARY
GLUCOSE-CAPILLARY: 92 mg/dL (ref 65–99)
GLUCOSE-CAPILLARY: 110 mg/dL — AB (ref 65–99)
GLUCOSE-CAPILLARY: 61 mg/dL — AB (ref 65–99)
GLUCOSE-CAPILLARY: 145 mg/dL — AB (ref 65–99)
Glucose-Capillary: 187 mg/dL — ABNORMAL HIGH (ref 65–99)
Glucose-Capillary: 328 mg/dL — ABNORMAL HIGH (ref 65–99)
Glucose-Capillary: 34 mg/dL — CL (ref 65–99)
Glucose-Capillary: 402 mg/dL — ABNORMAL HIGH (ref 65–99)
Glucose-Capillary: 405 mg/dL — ABNORMAL HIGH (ref 65–99)
Glucose-Capillary: 80 mg/dL (ref 65–99)

## 2017-12-27 LAB — BASIC METABOLIC PANEL
Anion gap: 10 (ref 5–15)
BUN: 35 mg/dL — ABNORMAL HIGH (ref 6–20)
CHLORIDE: 101 mmol/L (ref 101–111)
CO2: 28 mmol/L (ref 22–32)
Calcium: 8.5 mg/dL — ABNORMAL LOW (ref 8.9–10.3)
Creatinine, Ser: 1.27 mg/dL — ABNORMAL HIGH (ref 0.61–1.24)
GFR calc Af Amer: >60 mL/min (ref >60)
GFR calc non Af Amer: >60 mL/min (ref >60)
GLUCOSE: 154 mg/dL — AB (ref 65–99)
POTASSIUM: 3.8 mmol/L (ref 3.5–5.1)
Sodium: 139 mmol/L (ref 135–145)

## 2017-12-27 LAB — AMMONIA: Ammonia: 12 umol/L (ref 9–35)

## 2017-12-27 LAB — PHOSPHORUS: PHOSPHORUS: 2.2 mg/dL — AB (ref 2.5–4.6)

## 2017-12-27 LAB — MAGNESIUM: Magnesium: 1.7 mg/dL (ref 1.7–2.4)

## 2017-12-27 LAB — PROCALCITONIN: Procalcitonin: 1.69 ng/mL

## 2017-12-27 MED ORDER — DIPHENOXYLATE-ATROPINE 2.5-0.025 MG PO TABS
2.0000 | ORAL_TABLET | Freq: Four times a day (QID) | ORAL | Status: DC | PRN
  Administered 2017-12-27 – 2017-12-28 (×2): 2 via ORAL
  Filled 2017-12-27 (×2): qty 2

## 2017-12-27 MED ORDER — INSULIN GLARGINE 100 UNIT/ML ~~LOC~~ SOLN
5.0000 [IU] | Freq: Every day | SUBCUTANEOUS | Status: DC
  Administered 2017-12-28: 5 [IU] via SUBCUTANEOUS
  Filled 2017-12-27 (×3): qty 0.05

## 2017-12-27 MED ORDER — INSULIN GLARGINE 100 UNIT/ML ~~LOC~~ SOLN
10.0000 [IU] | Freq: Once | SUBCUTANEOUS | Status: AC
  Administered 2017-12-27: 10 [IU] via SUBCUTANEOUS
  Filled 2017-12-27: qty 0.1

## 2017-12-27 MED ORDER — POTASSIUM PHOSPHATES 15 MMOLE/5ML IV SOLN
30.0000 mmol | Freq: Once | INTRAVENOUS | Status: AC
  Administered 2017-12-27: 30 mmol via INTRAVENOUS
  Filled 2017-12-27: qty 10

## 2017-12-27 MED ORDER — PREMIER PROTEIN SHAKE
2.0000 [oz_av] | Freq: Three times a day (TID) | ORAL | Status: DC
  Administered 2017-12-27 – 2017-12-30 (×10): 2 [oz_av] via ORAL

## 2017-12-27 MED ORDER — INSULIN ASPART 100 UNIT/ML ~~LOC~~ SOLN
5.0000 [IU] | Freq: Once | SUBCUTANEOUS | Status: AC
  Administered 2017-12-27: 5 [IU] via SUBCUTANEOUS

## 2017-12-27 MED ORDER — INSULIN ASPART 100 UNIT/ML ~~LOC~~ SOLN
0.0000 [IU] | SUBCUTANEOUS | Status: DC
Start: 2017-12-27 — End: 2017-12-29
  Administered 2017-12-28 (×3): 2 [IU] via SUBCUTANEOUS
  Administered 2017-12-28: 3 [IU] via SUBCUTANEOUS
  Administered 2017-12-28: 1 [IU] via SUBCUTANEOUS
  Administered 2017-12-28: 3 [IU] via SUBCUTANEOUS
  Administered 2017-12-29: 1 [IU] via SUBCUTANEOUS
  Administered 2017-12-29: 2 [IU] via SUBCUTANEOUS
  Administered 2017-12-29: 1 [IU] via SUBCUTANEOUS
  Filled 2017-12-27 (×9): qty 1

## 2017-12-27 MED ORDER — INSULIN ASPART 100 UNIT/ML ~~LOC~~ SOLN
3.0000 [IU] | Freq: Three times a day (TID) | SUBCUTANEOUS | Status: DC
  Administered 2017-12-27 – 2017-12-29 (×5): 3 [IU] via SUBCUTANEOUS
  Filled 2017-12-27 (×4): qty 1

## 2017-12-27 NOTE — Progress Notes (Signed)
PULMONARY / CRITICAL CARE MEDICINE   Name: Maurice Davidson MRN: [id number] DOB: 07/09/1985    ADMISSION DATE:  12/22/2017  PT PROFILE: 37 M with DM I adm 04/08 via ED with AMS, combativeness, DKA. Intubated in ED. Flu PCR positive and BC positive for S pneumoniae  MAJOR EVENTS/TEST RESULTS: 04/08 As above. Treated with DKA protocol. Stabilized on vent. PAD protocol 04/10 increasing agitation and delirium.  Dexmedetomidine infusion initiated.  Recurrent hypoglycemic episodes 04/11 severe lethargy.  Dexmedetomidine discontinued.  Lethargy persisted.  Continued to have recurrent episodes of hypoglycemia despite D10 infusion. Neurology consultation requested. ID consultation requested. Concern for meningitis > Ceftriaxone dose increased, vancomycin resumed, dexamethasone initiated 04/11 CT head: No acute intracranial findings  INDWELLING DEVICES:: ETT 04/08 >> 04/10 RUE PICC 04/11 >>   MICRO DATA: MRSA PCR 04/08 >> POS Blood 04/08 >> S pneumoniae Resp 04/09 >>    ANTIMICROBIALS:  Pip-tazo 04/08 X 1  Oseltamivir 04/08 >> 04/10 Vanc 04/08 >> 04/09, 04/11 >>  Ceftriaxine 04/08 >>    SUBJECTIVE:  Severe lethargy this AM.  Dexmedetomidine discontinued.  Lethargy persisted.  Continued to have recurrent episodes of hypoglycemia despite D10 infusion. Concern for meningitis > Ceftriaxone dose increased, vancomycin resumed, dexamethasone initiated  VITAL SIGNS: BP 123/81   Pulse (!) 113   Temp 99.5 F (37.5 C)   Resp 14   Ht 5\' 9"  (1.753 m)   Wt 95 lb 7.4 oz (43.3 kg)   SpO2 92%   BMI 14.10 kg/m   HEMODYNAMICS:  No hemodynamic compromise  OXYGEN: 3 liters [place]  INTAKE / OUTPUT: I/O last 3 completed shifts: In: 5145.8 [P.O.:1047; I.V.:2748.8; IV Piggyback:1350] Out: 6063 [KZSWF:0932; Stool:3]  PHYSICAL EXAMINATION: General: awake and cooperative Neuro: AAO x 2, no motor deficits but slight left neglect HEENT: NCAT, sclerae white Cardiovascular: Tachy, regular, no M Lungs:  Bilateral rhonchi, no wheezes,  Abdomen: Soft, no palpable masses, + BS Extremities: Warm, no edema  LABS:  BMET Recent Labs  Lab 12/26/17 0501 12/26/17 1959 12/27/17 0425  NA 143 143 139  K 4.1 4.0 3.8  CL 105 106 101  CO2 31 30 28   BUN 43* 35* 35*  CREATININE 1.59* 1.33* 1.27*  GLUCOSE 229* 42* 154*    Electrolytes Recent Labs  Lab 12/24/17 0511  12/25/17 1830 12/26/17 0501 12/26/17 1959 12/27/17 0425  CALCIUM 7.1*   < > 7.8* 7.8* 8.6* 8.5*  MG 2.6*  --   --  1.3*  --  1.7  PHOS 1.4*  --  1.9* 3.2  --  2.2*   < > = values in this interval not displayed.    CBC Recent Labs  Lab 12/23/17 0535 12/25/17 0645 12/26/17 0501  WBC 7.2 14.2* 12.3*  HGB 11.3* 9.3* 9.0*  HCT 35.0* 27.6* 26.6*  PLT 277 147* 130*    Coag's No results for input(s): APTT, INR in the last 168 hours.  Sepsis Markers Recent Labs  Lab 12/23/17 0235 12/23/17 0248 12/26/17 0501 12/27/17 0425  LATICACIDVEN 1.4  --   --   --   PROCALCITON  --  17.33 3.88 1.69    ABG Recent Labs  Lab 12/23/17 0031 12/23/17 0207 12/23/17 0532  PHART <6.900* 7.16* 7.33*  PCO2ART 45 54* 48  PO2ART 333* 430* 115*    Liver Enzymes Recent Labs  Lab 12/22/17 1956 12/23/17 2108 12/25/17 0645 12/26/17 0501  AST 42*  --  104* 62*  ALT 17  --  49 49  ALKPHOS 136*  --  323* 342*  BILITOT 1.8*  --  0.6 0.9  ALBUMIN 2.8* 1.9* 2.1* 2.3*    Cardiac Enzymes No results for input(s): TROPONINI, PROBNP in the last 168 hours.  Glucose Recent Labs  Lab 12/26/17 2050 12/27/17 0033 12/27/17 0041 12/27/17 0348 12/27/17 0712 12/27/17 1116  GLUCAP 142* 402* 405* 187* 80 328*    CXR: Increased RUL consolidation increasing infiltrate in RLL, patchy opacities on L    ASSESSMENT / PLAN: Acute hypoxemic respiratory failure-resolved Pneumococcal pneumonia- abx to course Pneumococcal bacteremia repeated cultures negative thus far, ECHO showed no vegetations Influenza A + Could not R/O  pneumococcal meningitis- ABX adjusted to cover Agitated delirium, resolved Severe lethargy, stupor- resolved DKA, resolved, Type 1 DM uncontrolled Recurrent hypoglycemia- much improved Protein-calorie malnutrition- moderate Chronic pain syndrome History of opioid abuse Acute kidney Injury- improving  Cont supplemental oxygen and wean to maintain SPO2 >90% D/C foley cath Micro and abx as above - Ceftriaxone dose increased, vancomycin resumed 04/11 Dexamethasone initiated 04/11 Cont Lantus - low dose Cont SSI  Closely monitor Glucose as patient tends to run low around 1800 and 0800.     Family: Parents updated Disp: out of bed, transfer out of ICU  [name], MD PCCM service Pager [phone] 12/27/2017, 12:29 PM

## 2017-12-27 NOTE — Progress Notes (Signed)
Patient ID: Maurice Davidson, male   DOB: 07/09/1985, 32 y.o.   MRN: [id number]  [facility] PROGRESS NOTE  Maurice Davidson HKV:[id number] DOB: 07/09/1985 DOA: 12/22/2017 PCP: Trinna Post, PA-C  HPI/Subjective: Patient feeling better.  Having chest discomfort with some shortness of breath.  Still on quite a bit of oxygen.  No real cough.  Objective: Vitals:   12/27/17 1200 12/27/17 1300  BP:  (!) 120/108  Pulse: (!) 106 (!) 106  Resp: 19 15  Temp: 99.1 F (37.3 C)   SpO2:  97%    Filed Weights   12/25/17 0500 12/26/17 0500 12/27/17 0400  Weight: 42.5 kg (93 lb 11.1 oz) 40.6 kg (89 lb 8.1 oz) 43.3 kg (95 lb 7.4 oz)    ROS: Review of Systems  Constitutional: Negative for chills and fever.  Eyes: Negative for blurred vision.  Respiratory: Positive for shortness of breath. Negative for cough.   Cardiovascular: Positive for chest pain.  Gastrointestinal: Negative for abdominal pain, constipation, diarrhea, nausea and vomiting.  Genitourinary: Negative for dysuria.  Musculoskeletal: Negative for joint pain.  Neurological: Negative for dizziness and headaches.   Exam: Physical Exam  HENT:  Nose: No mucosal edema.  Mouth/Throat: No oropharyngeal exudate or posterior oropharyngeal edema.  Eyes: Pupils are equal, round, and reactive to light. Conjunctivae, EOM and lids are normal.  Neck: No JVD present. Carotid bruit is not present. No edema present. No thyroid mass and no thyromegaly present.  Cardiovascular: S1 normal and S2 normal. Exam reveals no gallop.  No murmur heard. Pulses:      Dorsalis pedis pulses are 2+ on the right side, and 2+ on the left side.  Respiratory: No respiratory distress. He has no wheezes. He has no rhonchi. He has no rales.  GI: Soft. Bowel sounds are normal. There is no tenderness.  Musculoskeletal:       Right ankle: He exhibits no swelling.       Left ankle: He exhibits no swelling.  Lymphadenopathy:    He has no cervical adenopathy.   Neurological: He is alert. No cranial nerve deficit.  Skin: Skin is warm. No rash noted. Nails show no clubbing.  Psychiatric: He has a normal mood and affect.      Data Reviewed: Basic Metabolic Panel: Recent Labs  Lab 12/23/17 1355 12/23/17 1653 12/24/17 0511 12/25/17 0645 12/25/17 1830 12/26/17 0501 12/26/17 1959 12/27/17 0425  NA 143 142 140 148* 147* 143 143 139  K 3.2* 2.7* 3.6 2.8* 3.2* 4.1 4.0 3.8  CL 107 106 104 110 108 105 106 101  CO2 '29 28 28 31 ' 33* '31 30 28  ' GLUCOSE 239* 204* 309* 104* 31* 229* 42* 154*  BUN 85* 84* 80* 65* 52* 43* 35* 35*  CREATININE 2.68* 2.68* 2.70* 2.12* 1.75* 1.59* 1.33* 1.27*  CALCIUM 7.0* 6.9* 7.1* 7.6* 7.8* 7.8* 8.6* 8.5*  MG 2.6* 2.5* 2.6*  --   --  1.3*  --  1.7  PHOS <1.0* <1.0* 1.4*  --  1.9* 3.2  --  2.2*   Liver Function Tests: Recent Labs  Lab 12/22/17 1956 12/23/17 2108 12/25/17 0645 12/26/17 0501  AST 42*  --  104* 62*  ALT 17  --  49 49  ALKPHOS 136*  --  323* 342*  BILITOT 1.8*  --  0.6 0.9  PROT 6.5  --  6.1* 6.7  ALBUMIN 2.8* 1.9* 2.1* 2.3*   No results for input(s): LIPASE, AMYLASE in the last 168 hours. Recent  Labs  Lab 12/27/17 0425  AMMONIA 12   CBC: Recent Labs  Lab 12/22/17 1956 12/23/17 0240 12/23/17 0535 12/25/17 0645 12/26/17 0501  WBC 10.3 7.5 7.2 14.2* 12.3*  NEUTROABS 9.4*  --   --   --   --   HGB 10.8* 11.5* 11.3* 9.3* 9.0*  HCT 36.5* 34.8* 35.0* 27.6* 26.6*  MCV 101.2* 92.8 91.9 89.1 89.4  PLT 276 249 277 147* 130*   Cardiac Enzymes: No results for input(s): CKTOTAL, CKMB, CKMBINDEX, TROPONINI in the last 168 hours. BNP (last 3 results) No results for input(s): BNP in the last 8760 hours.  ProBNP (last 3 results) No results for input(s): PROBNP in the last 8760 hours.  CBG: Recent Labs  Lab 12/27/17 0033 12/27/17 0041 12/27/17 0348 12/27/17 0712 12/27/17 1116  GLUCAP 402* 405* 187* 80 328*    Recent Results (from the past 240 hour(s))  Blood Culture (routine x 2)      Status: Abnormal   Collection Time: 12/22/17  9:31 PM  Result Value Ref Range Status   Specimen Description   Final    BLOOD RIGHT ANTECUBITAL Performed at [facility], [address], [place], [place] [address]    Special Requests   Final    BOTTLES DRAWN AEROBIC AND ANAEROBIC Blood Culture results may not be optimal due to an excessive volume of blood received in culture bottles Performed at [facility], [address] [place], [place] [address]    Culture  Setup Time   Final    Organism ID to follow [place] CRITICAL RESULT CALLED TO, READ BACK BY AND VERIFIED WITH: LISA KLUTTZ AT 2951 12/23/17 [place] Performed at [facility], [address] [place], [place] [address]    Culture (A)  Final    STREPTOCOCCUS PNEUMONIAE SUSCEPTIBILITIES PERFORMED ON PREVIOUS CULTURE WITHIN THE LAST 5 DAYS. Performed at [facility], [place] [address], [place], [place] [address]    Report Status 12/25/2017 FINAL  Final  Blood Culture (routine x 2)     Status: Abnormal   Collection Time: 12/22/17  9:31 PM  Result Value Ref Range Status   Specimen Description   Final    BLOOD BLOOD LEFT FOREARM Performed at [facility], [address] [place], [place] [address]    Special Requests   Final    BOTTLES DRAWN AEROBIC AND ANAEROBIC Blood Culture adequate volume Performed at [facility], [address] [place], [place] [address]    Culture  Setup Time   Final    GRAM POSITIVE COCCI IN BOTH AEROBIC AND ANAEROBIC BOTTLES CRITICAL VALUE NOTED.  VALUE IS CONSISTENT WITH PREVIOUSLY REPORTED AND CALLED VALUE. Performed at [facility], [address] [place],  [address]    Culture STREPTOCOCCUS PNEUMONIAE (A)  Final   Report Status 12/25/2017 FINAL  Final   Organism ID, Bacteria STREPTOCOCCUS PNEUMONIAE  Final      Susceptibility   Streptococcus pneumoniae - MIC*    ERYTHROMYCIN  >=8 RESISTANT Resistant     LEVOFLOXACIN 1 SENSITIVE Sensitive     PENICILLIN (meningitis) 1 RESISTANT Resistant     PENICILLIN (non-meningitis) 1 SENSITIVE Sensitive     CEFTRIAXONE (non-meningitis) 1 SENSITIVE Sensitive     CEFTRIAXONE (meningitis) 1 INTERMEDIATE Intermediate     * STREPTOCOCCUS PNEUMONIAE  Blood Culture ID Panel (Reflexed)     Status: Abnormal   Collection Time: 12/22/17  9:31 PM  Result  Value Ref Range Status   Enterococcus species NOT DETECTED NOT DETECTED Final   Listeria monocytogenes NOT DETECTED NOT DETECTED Final   Staphylococcus species NOT DETECTED NOT DETECTED Final   Staphylococcus aureus NOT DETECTED NOT DETECTED Final   Streptococcus species DETECTED (A) NOT DETECTED Final    Comment: CRITICAL RESULT CALLED TO, READ BACK BY AND VERIFIED WITH:  LISA KLUTTZ AT 8110 12/23/17 SDR    Streptococcus agalactiae NOT DETECTED NOT DETECTED Final   Streptococcus pneumoniae DETECTED (A) NOT DETECTED Final    Comment: CRITICAL RESULT CALLED TO, READ BACK BY AND VERIFIED WITH:  LISA KLUTTZ AT 3159 12/23/17 SDR    Streptococcus pyogenes NOT DETECTED NOT DETECTED Final   Acinetobacter baumannii NOT DETECTED NOT DETECTED Final   Enterobacteriaceae species NOT DETECTED NOT DETECTED Final   Enterobacter cloacae complex NOT DETECTED NOT DETECTED Final   Escherichia coli NOT DETECTED NOT DETECTED Final   Klebsiella oxytoca NOT DETECTED NOT DETECTED Final   Klebsiella pneumoniae NOT DETECTED NOT DETECTED Final   Proteus species NOT DETECTED NOT DETECTED Final   Serratia marcescens NOT DETECTED NOT DETECTED Final   Haemophilus influenzae NOT DETECTED NOT DETECTED Final   Neisseria meningitidis NOT DETECTED NOT DETECTED Final   Pseudomonas aeruginosa NOT DETECTED NOT DETECTED Final   Candida albicans NOT DETECTED NOT DETECTED Final   Candida glabrata NOT DETECTED NOT DETECTED Final   Candida krusei NOT DETECTED NOT DETECTED Final   Candida parapsilosis NOT DETECTED NOT  DETECTED Final   Candida tropicalis NOT DETECTED NOT DETECTED Final    Comment: Performed at [facility], [address] [place], [place] [address]  MRSA PCR Screening     Status: Abnormal   Collection Time: 12/23/17  8:47 AM  Result Value Ref Range Status   MRSA by PCR POSITIVE (A) NEGATIVE Final    Comment:        The GeneXpert MRSA Assay (FDA approved for NASAL specimens only), is one component of a comprehensive MRSA colonization surveillance program. It is not intended to diagnose MRSA infection nor to guide or monitor treatment for MRSA infections. RESULT CALLED TO, READ BACK BY AND VERIFIED WITH: C/ MYRA FLOWERS '@1020'  12/23/17 [facility] Performed at [facility], [address] [place], [place] [address]   Culture, respiratory (NON-Expectorated)     Status: None   Collection Time: 12/23/17 12:24 PM  Result Value Ref Range Status   Specimen Description   Final    SPUTUM Performed at [facility], [address], [place], [place] [address]    Special Requests   Final    NONE Performed at [facility], [address] [place], [place] [address]    Gram Stain   Final    ABUNDANT WBC PRESENT,BOTH PMN AND MONONUCLEAR FEW GRAM POSITIVE COCCI    Culture   Final    Consistent with normal respiratory flora. Performed at [facility], [place] [address], [place], [place] [address]    Report Status 12/26/2017 FINAL  Final  CULTURE, BLOOD (ROUTINE X 2) w Reflex to ID Panel     Status: None (Preliminary result)   Collection Time: 12/26/17 11:27 AM  Result Value Ref Range Status   Specimen Description BLOOD LT [facility]  Final   Special Requests   Final    BOTTLES DRAWN AEROBIC AND ANAEROBIC Blood Culture adequate volume   Culture   Final    NO GROWTH < 24 HOURS Performed at [facility], [place]  [address] [place], [place] [address]    Report Status PENDING  Incomplete  CULTURE, BLOOD (ROUTINE X 2) w Reflex to ID Panel     Status: None  (Preliminary result)   Collection Time: 12/26/17 11:27 AM  Result Value Ref Range Status   Specimen Description BLOOD LT [facility]  Final   Special Requests   Final    BOTTLES DRAWN AEROBIC AND ANAEROBIC Blood Culture adequate volume   Culture   Final    NO GROWTH < 24 HOURS Performed at [facility], [address], [place], [place] [address]    Report Status PENDING  Incomplete  C difficile quick scan w PCR reflex     Status: None   Collection Time: 12/27/17  8:46 AM  Result Value Ref Range Status   C Diff antigen NEGATIVE NEGATIVE Final   C Diff toxin NEGATIVE NEGATIVE Final   C Diff interpretation No C. difficile detected.  Final    Comment: Performed at [facility], [address] [place], [place] [address]     Studies: Ct Head Wo Contrast  Result Date: 12/25/2017 CLINICAL DATA:  Altered mental status with diabetic ketoacidosis. EXAM: CT HEAD WITHOUT CONTRAST TECHNIQUE: Contiguous axial images were obtained from the base of the skull through the vertex without intravenous contrast. COMPARISON:  Jan 15, 2016 FINDINGS: Brain: The ventricles are normal in size and configuration. There is no intracranial mass, hemorrhage, extra-axial fluid collection, or midline shift. Gray-white compartments are normal. No acute infarct. Vascular: No hyperdense vessel. No appreciable vascular calcification. Skull: The bony calvarium appears intact. There is mild soft tissue swelling over the right parietal region. No scalp hematoma evident. Sinuses/Orbits: There is opacification throughout multiple ethmoid air cells bilaterally. There is mild mucosal thickening in both inferior frontal sinuses. There is extensive mucosal thickening in the sphenoid sinus regions as well as in both maxillary antra. No air-fluid levels. Orbits appear symmetric bilaterally. Other: Mastoid air cells are clear. IMPRESSION: 1. No intracranial mass or hemorrhage. Gray-white compartments appear normal. 2.  Extensive  multifocal paranasal sinus disease. 3. Soft tissue swelling over the right parietal bone. No well-defined scalp hematoma. Underlying bone appears normal. Electronically Signed   By: Lowella Grip III M.D.   On: 12/25/2017 15:23   Dg Chest Port 1 View  Result Date: 12/26/2017 CLINICAL DATA:  Respiratory failure EXAM: PORTABLE CHEST 1 VIEW COMPARISON:  12/25/2017 FINDINGS: Cardiac shadow is stable. Right-sided PICC line is now noted with catheter tip in the mid right atrium. Lungs are well aerated bilaterally with diffuse bilateral infiltrate right greater than left. The overall appearance is stable given some technical variations in the film. No acute bony abnormality is seen. Old rib fractures are noted on the right. IMPRESSION: Persistent bilateral infiltrates. PICC line as described. Electronically Signed   By: Inez Catalina M.D.   On: 12/26/2017 07:18   [place] Ekg Site Rite  Result Date: 12/25/2017 If Site Rite image not attached, placement could not be confirmed due to current cardiac rhythm.   Scheduled Meds: . aspirin EC  81 mg Oral Daily  . budesonide (PULMICORT) nebulizer solution  0.25 mg Nebulization BID  . chlorhexidine gluconate (MEDLINE KIT)  15 mL Mouth Rinse BID  . Chlorhexidine Gluconate Cloth  6 each Topical Q0600  . dexamethasone  2 mg Intravenous Q6H  . gabapentin  800 mg Oral TID  . heparin  5,000 Units Subcutaneous Q8H  . insulin aspart  0-9 Units Subcutaneous Q4H  . insulin aspart  3 Units Subcutaneous TID WC  . insulin glargine  5 Units Subcutaneous QHS  . ipratropium-albuterol  3 mL Nebulization Q6H  . multivitamin  15 mL Per Tube Daily  . mupirocin ointment  1 application Nasal BID  . protein supplement shake  2 oz Oral TID WC  . senna-docusate  1 tablet Per Tube BID  . sertraline  100 mg Oral Daily  . sodium chloride flush  10-40 mL Intracatheter Q12H   Continuous Infusions: . sodium chloride Stopped (12/26/17 1400)  . cefTRIAXone (ROCEPHIN)  IV Stopped  (12/27/17 1046)  . dextrose 5 % and 0.45% NaCl 75 mL/hr at 12/27/17 1020  . vancomycin Stopped (12/27/17 0140)    Assessment/Plan:  1. Severe sepsis with Streptococcus pneumoniae and blood cultures.  Patient also has multifocal pneumonia patient on Rocephin and vancomycin 2. Acute hypoxic respiratory failure.  Patient off high flow nasal cannula but on 6 L of oxygen. 3. Diabetic ketoacidosis.  This has improved and patient on Lantus and sliding scale 4. Influenza A positive on Tamiflu 5. Acute kidney injury on chronic kidney disease.  This has improved a bit with IV fluid hydration.  Creatinine down to 1.27 6. History of seizure on Keppra  Code Status:     Code Status Orders  (From admission, onward)        Start     Ordered   12/23/17 0034  Full code  Continuous     12/23/17 0033    Code Status History    Date Active Date Inactive Code Status Order ID Comments User Context   11/28/2015 0059 11/30/2015 1346 Full Code [id number]  Lance Coon, MD Inpatient   06/02/2015 1002 06/04/2015 1455 Full Code [id number]  Gladstone Lighter, MD Inpatient     Family Communication: As per critical care specialist Disposition Plan: Hopefully will be able to get out of the ICU soon.  Hopefully will be able to get off oxygen prior to disposition  Antibiotics:  Vancomycin  Rocephin  Tamiflu  Time spent: 28 minutes  [place]

## 2017-12-27 NOTE — Progress Notes (Signed)
Spoke with Dr. Darvin Neighbours, pt blood sugar dropped to 34. Amp of dextrose 50 given per prn order. Dr. Darvin Neighbours wants to hold 5 units of lantus tonight.

## 2017-12-27 NOTE — Consult Note (Signed)
Awake, follows commands but is confused   Past Medical History:  Diagnosis Date  . Anemia   . Anxiety   . Aspiration pneumonia ([place])   . Chronic pain    take Suboxone  . Diabetes mellitus without complication ([place])   . Drug-seeking behavior   . Encephalopathy   . Gastroparesis   . H/O: GI bleed   . HTN (hypertension)   . MRSA (methicillin resistant staph aureus) culture positive   . Opioid dependence ([place])   . Renal disorder   . Seizures ([place])   . Sepsis [facility])     Past Surgical History:  Procedure Laterality Date  . ORTHOPEDIC SURGERY      Family History  Problem Relation Age of Onset  . Clotting disorder Sister        Von Willebrand's disease plus polycythemia vera  . Hypertension Mother     Social History:  reports that he has been smoking cigarettes.  He has been smoking about 1.00 pack per day. He has never used smokeless tobacco. He reports that he has current or past drug history. He reports that he does not drink alcohol.  Allergies  Allergen Reactions  . Benzodiazepines     Avoid prescribing. Not a true allergy  . Abilify [Aripiprazole] Rash    Medications:  I have reviewed the patient's current medications. Prior to Admission:  Medications Prior to Admission  Medication Sig Dispense Refill Last Dose  . acetaminophen (TYLENOL) 500 MG tablet Take 500 mg by mouth every 6 (six) hours as needed for mild pain.    PRN at PRN  . budesonide-formoterol (SYMBICORT) 160-4.5 MCG/ACT inhaler Inhale 2 puffs into the lungs 2 (two) times daily. 1 Inhaler 1 Unknown at Unknown  . Buprenorphine HCl-Naloxone HCl (SUBOXONE) 8-2 MG FILM Place 1 Film under the tongue 2 (two) times daily.   Unknown at Unknown  . glucagon (GLUCAGON EMERGENCY) 1 MG injection Inject 1 mg into the vein once as needed. 1 each 1 PRN at PRN  . ibuprofen (ADVIL,MOTRIN) 200 MG tablet Take 600-800 mg by mouth every 6 (six) hours as needed for headache or mild pain.   PRN at PRN  . insulin aspart (NOVOLOG)  100 UNIT/ML injection Inject 5 Units into the skin 3 (three) times daily with meals. Per PCP note on 10/21/17 pt taking Novolog 5 units TID with meals plus additional 1 unit for every 12 grams of carbs with max of 20 units     . insulin glargine (LANTUS) 100 UNIT/ML injection Inject 10 Units into the skin at bedtime.    Unknown at Unknown  . Multiple Vitamins-Minerals (MULTIVITAMIN WITH MINERALS) tablet Take 1 tablet by mouth daily.   Unknown at Unknown  . sertraline (ZOLOFT) 100 MG tablet Take 2 tablets (200 mg total) by mouth daily. 60 tablet 2 Unknown at Unknown  . traZODone (DESYREL) 50 MG tablet Take 3 tablets (150 mg total) by mouth at bedtime. 90 tablet 2 Unknown at Unknown  . gabapentin (NEURONTIN) 600 MG tablet Take 2 tablets (1,200 mg total) by mouth 3 (three) times daily. 180 tablet 2    Scheduled: . aspirin EC  81 mg Oral Daily  . budesonide (PULMICORT) nebulizer solution  0.25 mg Nebulization BID  . chlorhexidine gluconate (MEDLINE KIT)  15 mL Mouth Rinse BID  . Chlorhexidine Gluconate Cloth  6 each Topical Q0600  . dexamethasone  2 mg Intravenous Q6H  . gabapentin  800 mg Oral TID  . heparin  5,000 Units Subcutaneous  Q8H  . insulin aspart  3-9 Units Subcutaneous Q4H  . ipratropium-albuterol  3 mL Nebulization Q6H  . multivitamin  15 mL Per Tube Daily  . mupirocin ointment  1 application Nasal BID  . protein supplement shake  2 oz Oral TID WC  . senna-docusate  1 tablet Per Tube BID  . sertraline  100 mg Oral Daily  . sodium chloride flush  10-40 mL Intracatheter Q12H    ROS: Does not provide due to mental status  Physical Examination: Blood pressure 123/81, pulse (!) 113, temperature 99.5 F (37.5 C), resp. rate 14, height _0  (1.753 m), weight 95 lb 7.4 oz (43.3 kg), SpO2 92 %.    Neurological Examination   Mental Status: Awake, Alert to name and location. Couldn't tell me year or month.  Cranial Nerves: II: Discs flat bilaterally; Does not blink to confrontation  from the left, pupils equal, round, reactive to light and accommodation III,IV, VI: ptosis not present, extra-ocular motions intact bilaterally V,VII: smile symmetric, facial light touch sensation normal bilaterally VIII: hearing normal bilaterally IX,X: gag reflex present XI: bilateral shoulder shrug XII: midline tongue extension Motor: Moves all extremities against gravity.  No focal weakness noted Sensory: Does not respond to noxious stimuli in the LUE and both lower extremities.   Deep Tendon Reflexes: 2+ in the upper extremities and absent in the lower extremities Plantars: Right: mute   Left: mute Cerebellar: Patient does not perform due to level of cooperation Gait: not tested due to safety concerns  Laboratory Studies:   Basic Metabolic Panel: Recent Labs  Lab 12/23/17 1355 12/23/17 1653 12/24/17 0511 12/25/17 0645 12/25/17 1830 12/26/17 0501 12/26/17 1959 12/27/17 0425  NA 143 142 140 148* 147* 143 143 139  K 3.2* 2.7* 3.6 2.8* 3.2* 4.1 4.0 3.8  CL 107 106 104 110 108 105 106 101  CO2 _1 33* _2 GLUCOSE 239* 204* 309* 104* 31* 229* 42* 154*  BUN 85* 84* 80* 65* 52* 43* 35* 35*  CREATININE 2.68* 2.68* 2.70* 2.12* 1.75* 1.59* 1.33* 1.27*  CALCIUM 7.0* 6.9* 7.1* 7.6* 7.8* 7.8* 8.6* 8.5*  MG 2.6* 2.5* 2.6*  --   --  1.3*  --  1.7  PHOS <1.0* <1.0* 1.4*  --  1.9* 3.2  --  2.2*    Liver Function Tests: Recent Labs  Lab 12/22/17 1956 12/23/17 2108 12/25/17 0645 12/26/17 0501  AST 42*  --  104* 62*  ALT 17  --  49 49  ALKPHOS 136*  --  323* 342*  BILITOT 1.8*  --  0.6 0.9  PROT 6.5  --  6.1* 6.7  ALBUMIN 2.8* 1.9* 2.1* 2.3*   No results for input(s): LIPASE, AMYLASE in the last 168 hours. Recent Labs  Lab 12/27/17 0425  AMMONIA 12    CBC: Recent Labs  Lab 12/22/17 1956 12/23/17 0240 12/23/17 0535 12/25/17 0645 12/26/17 0501  WBC 10.3 7.5 7.2 14.2* 12.3*  NEUTROABS 9.4*  --   --   --   --   HGB 10.8* 11.5* 11.3* 9.3* 9.0*  HCT  36.5* 34.8* 35.0* 27.6* 26.6*  MCV 101.2* 92.8 91.9 89.1 89.4  PLT 276 249 277 147* 130*    Cardiac Enzymes: No results for input(s): CKTOTAL, CKMB, CKMBINDEX, TROPONINI in the last 168 hours.  BNP: Invalid input(s): POCBNP  CBG: Recent Labs  Lab 12/26/17 2050 12/27/17 0033 12/27/17 0041 12/27/17 0348 12/27/17 0712  GLUCAP 142* 402* 405* 187* 80  Microbiology: Results for orders placed or performed during the hospital encounter of 12/22/17  Blood Culture (routine x 2)     Status: Abnormal   Collection Time: 12/22/17  9:31 PM  Result Value Ref Range Status   Specimen Description   Final    BLOOD RIGHT ANTECUBITAL Performed at [facility], [address] [place], [place] [address]    Special Requests   Final    BOTTLES DRAWN AEROBIC AND ANAEROBIC Blood Culture results may not be optimal due to an excessive volume of blood received in culture bottles Performed at [facility], [address], [place], [place] [address]    Culture  Setup Time   Final    Organism ID to follow [place] CRITICAL RESULT CALLED TO, READ BACK BY AND VERIFIED WITH: LISA KLUTTZ AT [id number] 12/23/17 [place] Performed at [facility], [address] [place], [place] [address]    Culture (A)  Final    STREPTOCOCCUS PNEUMONIAE SUSCEPTIBILITIES PERFORMED ON PREVIOUS CULTURE WITHIN THE LAST 5 DAYS. Performed at [facility], [place] [address], [place], [place] [address]    Report Status 12/25/2017 FINAL  Final  Blood Culture (routine x 2)     Status: Abnormal   Collection Time: 12/22/17  9:31 PM  Result Value Ref Range Status   Specimen Description   Final    BLOOD BLOOD LEFT FOREARM Performed at [facility], [address], [place], [place] [address]    Special Requests   Final    BOTTLES DRAWN AEROBIC AND ANAEROBIC Blood Culture adequate volume Performed at [facility], [address],  [place], [place] [address]    Culture  Setup Time   Final    GRAM POSITIVE COCCI IN BOTH AEROBIC AND ANAEROBIC BOTTLES CRITICAL VALUE NOTED.  VALUE IS CONSISTENT WITH PREVIOUSLY REPORTED AND CALLED VALUE. Performed at [facility], [address] [place], [place] [address]    Culture STREPTOCOCCUS PNEUMONIAE (A)  Final   Report Status 12/25/2017 FINAL  Final   Organism ID, Bacteria STREPTOCOCCUS PNEUMONIAE  Final      Susceptibility   Streptococcus pneumoniae - MIC*    ERYTHROMYCIN >=8 RESISTANT Resistant     LEVOFLOXACIN 1 SENSITIVE Sensitive     PENICILLIN (meningitis) 1 RESISTANT Resistant     PENICILLIN (non-meningitis) 1 SENSITIVE Sensitive     CEFTRIAXONE (non-meningitis) 1 SENSITIVE Sensitive     CEFTRIAXONE (meningitis) 1 INTERMEDIATE Intermediate     * STREPTOCOCCUS PNEUMONIAE  Blood Culture ID Panel (Reflexed)     Status: Abnormal   Collection Time: 12/22/17  9:31 PM  Result Value Ref Range Status   Enterococcus species NOT DETECTED NOT DETECTED Final   Listeria monocytogenes NOT DETECTED NOT DETECTED Final   Staphylococcus species NOT DETECTED NOT DETECTED Final   Staphylococcus aureus NOT DETECTED NOT DETECTED Final   Streptococcus species DETECTED (A) NOT DETECTED Final    Comment: CRITICAL RESULT CALLED TO, READ BACK BY AND VERIFIED WITH:  LISA KLUTTZ AT 7425 12/23/17 SDR    Streptococcus agalactiae NOT DETECTED NOT DETECTED Final   Streptococcus pneumoniae DETECTED (A) NOT DETECTED Final    Comment: CRITICAL RESULT CALLED TO, READ BACK BY AND VERIFIED WITH:  LISA KLUTTZ AT [id number] 12/23/17 SDR    Streptococcus pyogenes NOT DETECTED NOT DETECTED Final   Acinetobacter baumannii NOT DETECTED NOT DETECTED Final   Enterobacteriaceae species NOT DETECTED NOT DETECTED Final   Enterobacter cloacae  complex NOT DETECTED NOT DETECTED Final   Escherichia coli NOT DETECTED NOT DETECTED Final   Klebsiella oxytoca NOT DETECTED NOT DETECTED Final   Klebsiella pneumoniae NOT  DETECTED NOT DETECTED Final   Proteus species NOT DETECTED NOT DETECTED Final   Serratia marcescens NOT DETECTED NOT DETECTED Final   Haemophilus influenzae NOT DETECTED NOT DETECTED Final   Neisseria meningitidis NOT DETECTED NOT DETECTED Final   Pseudomonas aeruginosa NOT DETECTED NOT DETECTED Final   Candida albicans NOT DETECTED NOT DETECTED Final   Candida glabrata NOT DETECTED NOT DETECTED Final   Candida krusei NOT DETECTED NOT DETECTED Final   Candida parapsilosis NOT DETECTED NOT DETECTED Final   Candida tropicalis NOT DETECTED NOT DETECTED Final    Comment: Performed at [facility], [address] [place], [place] [address]  MRSA PCR Screening     Status: Abnormal   Collection Time: 12/23/17  8:47 AM  Result Value Ref Range Status   MRSA by PCR POSITIVE (A) NEGATIVE Final    Comment:        The GeneXpert MRSA Assay (FDA approved for NASAL specimens only), is one component of a comprehensive MRSA colonization surveillance program. It is not intended to diagnose MRSA infection nor to guide or monitor treatment for MRSA infections. RESULT CALLED TO, READ BACK BY AND VERIFIED WITH: C/ MYRA FLOWERS _0  12/23/17 [facility] Performed at [facility], [address] [place], [place] [address]   Culture, respiratory (NON-Expectorated)     Status: None   Collection Time: 12/23/17 12:24 PM  Result Value Ref Range Status   Specimen Description   Final    SPUTUM Performed at [facility], [address], [place], [place] [address]    Special Requests   Final    NONE Performed at [facility], [address] [place], [place] [address]    Gram Stain   Final    ABUNDANT WBC PRESENT,BOTH PMN AND MONONUCLEAR FEW GRAM POSITIVE COCCI    Culture   Final    Consistent with normal respiratory flora. Performed at [facility], [place] [address], [place], [place] [address]    Report Status 12/26/2017 FINAL  Final  CULTURE, BLOOD (ROUTINE X 2)  w Reflex to ID Panel     Status: None (Preliminary result)   Collection Time: 12/26/17 11:27 AM  Result Value Ref Range Status   Specimen Description BLOOD LT [facility]  Final   Special Requests   Final    BOTTLES DRAWN AEROBIC AND ANAEROBIC Blood Culture adequate volume   Culture   Final    NO GROWTH < 24 HOURS Performed at [facility], [address], [place], [place] [address]    Report Status PENDING  Incomplete  CULTURE, BLOOD (ROUTINE X 2) w Reflex to ID Panel     Status: None (Preliminary result)   Collection Time: 12/26/17 11:27 AM  Result Value Ref Range Status   Specimen Description BLOOD LT [facility]  Final   Special Requests   Final    BOTTLES DRAWN AEROBIC AND ANAEROBIC Blood Culture adequate volume   Culture   Final    NO GROWTH < 24 HOURS Performed at [facility], [address], [place], [place] [address]    Report Status PENDING  Incomplete    Coagulation Studies: No results for input(s): LABPROT, INR in the last 72 hours.  Urinalysis:  Recent Labs  Lab 12/23/17 0051  COLORURINE YELLOW*  LABSPEC 1.016  PHURINE 5.0  GLUCOSEU >=500*  HGBUR MODERATE*  BILIRUBINUR NEGATIVE  KETONESUR 20*  PROTEINUR 30*  NITRITE NEGATIVE  LEUKOCYTESUR NEGATIVE    Lipid Panel:     Component Value Date/Time   CHOL 210 (H) 06/18/2017 1519   CHOL 153 10/19/2012 0414   TRIG 368 (H) 12/23/2017 1012   TRIG 309 (H) 09/24/2013 0412   HDL 42 06/18/2017 1519   HDL 35 (L) 10/19/2012 0414   CHOLHDL 5.0 (H) 06/18/2017 1519   VLDL 72 (H) 10/19/2012 0414   LDLCALC 126 (H) 06/18/2017 1519   LDLCALC 46 10/19/2012 0414    HgbA1C:  Lab Results  Component Value Date   HGBA1C 9.1 10/23/2017    Urine Drug Screen:      Component Value Date/Time   LABOPIA NONE DETECTED 12/23/2017 0051   COCAINSCRNUR NONE DETECTED 12/23/2017 0051   LABBENZ NONE DETECTED 12/23/2017 0051   AMPHETMU NONE DETECTED 12/23/2017 0051   THCU POSITIVE (A) 12/23/2017 0051   LABBARB NONE DETECTED  12/23/2017 0051    Alcohol Level:  Recent Labs  Lab 12/22/17 1956  ETH <10    Imaging: Ct Head Wo Contrast  Result Date: 12/25/2017 CLINICAL DATA:  Altered mental status with diabetic ketoacidosis. EXAM: CT HEAD WITHOUT CONTRAST TECHNIQUE: Contiguous axial images were obtained from the base of the skull through the vertex without intravenous contrast. COMPARISON:  Jan 15, 2016 FINDINGS: Brain: The ventricles are normal in size and configuration. There is no intracranial mass, hemorrhage, extra-axial fluid collection, or midline shift. Gray-white compartments are normal. No acute infarct. Vascular: No hyperdense vessel. No appreciable vascular calcification. Skull: The bony calvarium appears intact. There is mild soft tissue swelling over the right parietal region. No scalp hematoma evident. Sinuses/Orbits: There is opacification throughout multiple ethmoid air cells bilaterally. There is mild mucosal thickening in both inferior frontal sinuses. There is extensive mucosal thickening in the sphenoid sinus regions as well as in both maxillary antra. No air-fluid levels. Orbits appear symmetric bilaterally. Other: Mastoid air cells are clear. IMPRESSION: 1. No intracranial mass or hemorrhage. Gray-white compartments appear normal. 2.  Extensive multifocal paranasal sinus disease. 3. Soft tissue swelling over the right parietal bone. No well-defined scalp hematoma. Underlying bone appears normal. Electronically Signed   By: Lowella Grip III M.D.   On: 12/25/2017 15:23   Dg Chest Port 1 View  Result Date: 12/26/2017 CLINICAL DATA:  Respiratory failure EXAM: PORTABLE CHEST 1 VIEW COMPARISON:  12/25/2017 FINDINGS: Cardiac shadow is stable. Right-sided PICC line is now noted with catheter tip in the mid right atrium. Lungs are well aerated bilaterally with diffuse bilateral infiltrate right greater than left. The overall appearance is stable given some technical variations in the film. No acute bony  abnormality is seen. Old rib fractures are noted on the right. IMPRESSION: Persistent bilateral infiltrates. PICC line as described. Electronically Signed   By: Inez Catalina M.D.   On: 12/26/2017 07:18   [place] Ekg Site Rite  Result Date: 12/25/2017 If Site Rite image not attached, placement could not be confirmed due to current cardiac rhythm.    Assessment/Plan: 32 year old male presenting with confusion.  Noted to be in DKA with positive flu PCR and bilateral chest infiltrates.  White blood cell count elevated.  Renal function decreased as well.  These do appear to be slowly improving.  Hepatic function worsening.  Has had multiple episodes of hypoglycemia.  Suspect etiology of mental status is multifactorial and related to multiple medical issues.   Likely metabolic in  setting of PNA/Influenza Much improved but still confusion.   Due to lack of focality and possible explanation for  current cause of encephalopathy I would hold of MRI at this time Ammonia is checked and is 12 today

## 2017-12-27 NOTE — Progress Notes (Signed)
Pt's peripheral blood glucose 402, central line blood glucose 405.  Notified Patria Mane, NP.   Orders received.

## 2017-12-28 LAB — GLUCOSE, CAPILLARY
GLUCOSE-CAPILLARY: 131 mg/dL — AB (ref 65–99)
GLUCOSE-CAPILLARY: 157 mg/dL — AB (ref 65–99)
GLUCOSE-CAPILLARY: 215 mg/dL — AB (ref 65–99)
GLUCOSE-CAPILLARY: 234 mg/dL — AB (ref 65–99)
Glucose-Capillary: 149 mg/dL — ABNORMAL HIGH (ref 65–99)
Glucose-Capillary: 171 mg/dL — ABNORMAL HIGH (ref 65–99)
Glucose-Capillary: 162 mg/dL — ABNORMAL HIGH (ref 65–99)
Glucose-Capillary: 139 mg/dL — ABNORMAL HIGH (ref 65–99)

## 2017-12-28 LAB — VANCOMYCIN, TROUGH: Vancomycin Tr: 22 ug/mL (ref 15–20)

## 2017-12-28 MED ORDER — ADULT MULTIVITAMIN W/MINERALS CH
1.0000 | ORAL_TABLET | Freq: Every day | ORAL | Status: DC
  Administered 2017-12-28 – 2017-12-31 (×4): 1 via ORAL
  Filled 2017-12-28 (×4): qty 1

## 2017-12-28 MED ORDER — GABAPENTIN 400 MG PO CAPS
800.0000 mg | ORAL_CAPSULE | Freq: Three times a day (TID) | ORAL | Status: DC
  Administered 2017-12-28 – 2017-12-31 (×8): 800 mg via ORAL
  Filled 2017-12-28 (×8): qty 2

## 2017-12-28 MED ORDER — GABAPENTIN 800 MG PO TABS
800.0000 mg | ORAL_TABLET | Freq: Three times a day (TID) | ORAL | Status: DC
  Filled 2017-12-28: qty 1

## 2017-12-28 MED ORDER — DICLOFENAC SODIUM 1 % TD GEL
2.0000 g | Freq: Four times a day (QID) | TRANSDERMAL | Status: DC | PRN
  Filled 2017-12-28: qty 100

## 2017-12-28 MED ORDER — SENNOSIDES-DOCUSATE SODIUM 8.6-50 MG PO TABS
1.0000 | ORAL_TABLET | Freq: Two times a day (BID) | ORAL | Status: DC
  Administered 2017-12-28 – 2017-12-30 (×4): 1 via ORAL
  Filled 2017-12-28 (×4): qty 1

## 2017-12-28 MED ORDER — DEXAMETHASONE SODIUM PHOSPHATE 4 MG/ML IJ SOLN
2.0000 mg | Freq: Two times a day (BID) | INTRAMUSCULAR | Status: DC
  Administered 2017-12-28 – 2017-12-30 (×4): 2 mg via INTRAVENOUS
  Filled 2017-12-28 (×5): qty 0.5

## 2017-12-28 MED ORDER — BUPRENORPHINE HCL-NALOXONE HCL 8-2 MG SL SUBL
1.0000 | SUBLINGUAL_TABLET | Freq: Every day | SUBLINGUAL | Status: DC
  Administered 2017-12-28 – 2017-12-31 (×4): 1 via SUBLINGUAL
  Filled 2017-12-28 (×4): qty 1

## 2017-12-28 NOTE — Progress Notes (Signed)
Spoke with Shanon Brow the pharmacist he is stating ok to give the vancomycin.

## 2017-12-28 NOTE — Progress Notes (Signed)
Per patient, he takes Suboxone at home once a day. And he verbalizes that he feels like he needs it since last dose Sunday or Monday. MD [place] paged and per MD ok to restart back on it but PRN pain meds need to be  Discontinued. ( tramadol) Order placed

## 2017-12-28 NOTE — Progress Notes (Signed)
Patient complaining of loose stools and abdominal cramping. PRN Lomotil given

## 2017-12-28 NOTE — Progress Notes (Signed)
O2 sat 90-91 on rm air at rest

## 2017-12-28 NOTE — Progress Notes (Signed)
Pt has been alert and oriented this shift.  Pt did have an episode of hypoglycemia and d50 was given with good results. Pt up and walking to bathroom with supervision. Picc line infusing without difficulty.

## 2017-12-28 NOTE — Progress Notes (Signed)
O2 Sats 91-92% on 1 L with ambulation around the unit.  O2 sats dropped to 87 at rest on room air. Patient remains on 1 L O2 now 94% at rest.

## 2017-12-28 NOTE — Progress Notes (Signed)
Pharmacy Antibiotic Note  Maurice Davidson is a 32 y.o. male admitted on 12/22/2017 with S. Pneumoniae bacteremia and concern for meningitis.  Pharmacy has been consulted for vancomycin and ceftriaxone dosing.  Plan: Continue vancomycin 750mg  IV Q24hr for goal trough of 15-20. Will obtain trough prior to dose on 4/14.  Continue ceftriaxone 2g IV Q12hr to cover possible meningitis.   04/14 @ 0005 VT 22.0 drawn appropriately, but not drawn at steady state. trough also falsely elevated due to two doses being given 12 hours apart on 04/12. Scr 1.59 >> 1.33 >> 1.27, UOP 3L in past 24 hours. Will continue current regimen of vanc 750 mg IV q24h since there is a concern for meningitis and will recheck VT 04/16 @ 0000 prior to 4th dose.  Height: 5\' 9"  (175.3 cm) Weight: 95 lb 7.4 oz (43.3 kg) IBW/kg (Calculated) : 70.7  Temp (24hrs), Avg:99.4 F (37.4 C), Min:97.8 F (36.6 C), Max:100.4 F (38 C)  Recent Labs  Lab 12/22/17 1956 12/23/17 0235 12/23/17 0240 12/23/17 0535  12/25/17 0645 12/25/17 1830 12/26/17 0501 12/26/17 1959 12/27/17 0425 12/28/17 0005  WBC 10.3  --  7.5 7.2  --  14.2*  --  12.3*  --   --   --   CREATININE 3.12*  --  3.14* 2.89*   < > 2.12* 1.75* 1.59* 1.33* 1.27*  --   LATICACIDVEN  --  1.4  --   --   --   --   --   --   --   --   --   VANCOTROUGH  --   --   --   --   --   --   --   --   --   --  22*   < > = values in this interval not displayed.    Estimated Creatinine Clearance: 50.7 mL/min (A) (by C-G formula based on SCr of 1.27 mg/dL (H)).    Allergies  Allergen Reactions  . Benzodiazepines     Avoid prescribing. Not a true allergy  . Abilify [Aripiprazole] Rash    Antimicrobials this admission: Cefepime 4/08 >> 4/9 Vancomycin 4/08 x 1, 4/11 >>  Oseltamivir 4/09 >> 4/13 Ceftriaxone 4/9 >>    Dose adjustments this admission: 4/11 Ceftriaxone adjusted from Q12hr to Q24hr dosing to cover possible meningitis.   Microbiology results: 4/8 BCx: Strep  Pneumo  4/12 BCx: pending  4/9 Sputum: normal flora  4/9  MRSA PCR: positive  Thank you for allowing pharmacy to be a part of this patient's care.  Tobie Lords, PharmD, BCPS Clinical Pharmacist 12/28/2017

## 2017-12-28 NOTE — Progress Notes (Signed)
Patient ID: Maurice Davidson, male   DOB: 07/09/1985, 32 y.o.   MRN: [id number]  [facility] PROGRESS NOTE  BRET VANESSEN WHQ:[id number] DOB: 07/09/1985 DOA: 12/22/2017 PCP: Trinna Post, PA-C  HPI/Subjective: Patient seen this morning and he had his oxygen off.  We checked a pulse ox and it was 73% on room air.  I placed him back on oxygen and was able to taper down to 1 L.  Patient feeling a lot better.  Still with some shortness of breath and cough.  Objective: Vitals:   12/28/17 0817 12/28/17 0830  BP: 106/86 (!) 134/96  Pulse: (!) 104 (!) 109  Resp: 20 18  Temp: 97.8 F (36.6 C)   SpO2: 98% 100%    Filed Weights   12/26/17 0500 12/27/17 0400 12/28/17 0500  Weight: 40.6 kg (89 lb 8.1 oz) 43.3 kg (95 lb 7.4 oz) 43.5 kg (96 lb)    ROS: Review of Systems  Constitutional: Negative for chills and fever.  Eyes: Negative for blurred vision.  Respiratory: Positive for cough and shortness of breath.   Cardiovascular: Negative for chest pain.  Gastrointestinal: Negative for abdominal pain, constipation, diarrhea, nausea and vomiting.  Genitourinary: Negative for dysuria.  Musculoskeletal: Negative for joint pain.  Neurological: Negative for dizziness and headaches.   Exam: Physical Exam  HENT:  Nose: No mucosal edema.  Mouth/Throat: No oropharyngeal exudate or posterior oropharyngeal edema.  Eyes: Pupils are equal, round, and reactive to light. Conjunctivae, EOM and lids are normal.  Neck: No JVD present. Carotid bruit is not present. No edema present. No thyroid mass and no thyromegaly present.  Cardiovascular: S1 normal and S2 normal. Exam reveals no gallop.  No murmur heard. Pulses:      Dorsalis pedis pulses are 2+ on the right side, and 2+ on the left side.  Respiratory: No respiratory distress. He has no wheezes. He has no rhonchi. He has no rales.  GI: Soft. Bowel sounds are normal. There is no tenderness.  Musculoskeletal:       Right ankle: He exhibits no  swelling.       Left ankle: He exhibits no swelling.  Lymphadenopathy:    He has no cervical adenopathy.  Neurological: He is alert. No cranial nerve deficit.  Skin: Skin is warm. No rash noted. Nails show no clubbing.  Psychiatric: He has a normal mood and affect.      Data Reviewed: Basic Metabolic Panel: Recent Labs  Lab 12/23/17 1355 12/23/17 1653 12/24/17 0511 12/25/17 0645 12/25/17 1830 12/26/17 0501 12/26/17 1959 12/27/17 0425  NA 143 142 140 148* 147* 143 143 139  K 3.2* 2.7* 3.6 2.8* 3.2* 4.1 4.0 3.8  CL 107 106 104 110 108 105 106 101  CO2 '29 28 28 31 ' 33* '31 30 28  ' GLUCOSE 239* 204* 309* 104* 31* 229* 42* 154*  BUN 85* 84* 80* 65* 52* 43* 35* 35*  CREATININE 2.68* 2.68* 2.70* 2.12* 1.75* 1.59* 1.33* 1.27*  CALCIUM 7.0* 6.9* 7.1* 7.6* 7.8* 7.8* 8.6* 8.5*  MG 2.6* 2.5* 2.6*  --   --  1.3*  --  1.7  PHOS <1.0* <1.0* 1.4*  --  1.9* 3.2  --  2.2*   Liver Function Tests: Recent Labs  Lab 12/22/17 1956 12/23/17 2108 12/25/17 0645 12/26/17 0501  AST 42*  --  104* 62*  ALT 17  --  49 49  ALKPHOS 136*  --  323* 342*  BILITOT 1.8*  --  0.6 0.9  PROT 6.5  --  6.1* 6.7  ALBUMIN 2.8* 1.9* 2.1* 2.3*   No results for input(s): LIPASE, AMYLASE in the last 168 hours. Recent Labs  Lab 12/27/17 0425  AMMONIA 12   CBC: Recent Labs  Lab 12/22/17 1956 12/23/17 0240 12/23/17 0535 12/25/17 0645 12/26/17 0501  WBC 10.3 7.5 7.2 14.2* 12.3*  NEUTROABS 9.4*  --   --   --   --   HGB 10.8* 11.5* 11.3* 9.3* 9.0*  HCT 36.5* 34.8* 35.0* 27.6* 26.6*  MCV 101.2* 92.8 91.9 89.1 89.4  PLT 276 249 277 147* 130*   CBG: Recent Labs  Lab 12/28/17 0253 12/28/17 0400 12/28/17 0705 12/28/17 0832 12/28/17 1204  GLUCAP 131* 157* 149* 171* 215*    Recent Results (from the past 240 hour(s))  Blood Culture (routine x 2)     Status: Abnormal   Collection Time: 12/22/17  9:31 PM  Result Value Ref Range Status   Specimen Description   Final    BLOOD RIGHT  ANTECUBITAL Performed at [facility], [address] [place], [place] [address]    Special Requests   Final    BOTTLES DRAWN AEROBIC AND ANAEROBIC Blood Culture results may not be optimal due to an excessive volume of blood received in culture bottles Performed at [facility], [address] [place], [place] [address]    Culture  Setup Time   Final    Organism ID to follow [place] TO, READ BACK BY AND VERIFIED WITH: LISA KLUTTZ AT [id number] 12/23/17 [place] Performed at [facility], [address] [place], [place] [address]    Culture (A)  Final    STREPTOCOCCUS PNEUMONIAE SUSCEPTIBILITIES PERFORMED ON PREVIOUS CULTURE WITHIN THE LAST 5 DAYS. Performed at [facility], [place] [address], [place], [place] [address]    Report Status 12/25/2017 FINAL  Final  Blood Culture (routine x 2)     Status: Abnormal   Collection Time: 12/22/17  9:31 PM  Result Value Ref Range Status   Specimen Description   Final    BLOOD BLOOD LEFT FOREARM Performed at [facility], [address], [place], [place] [address]    Special Requests   Final    BOTTLES DRAWN AEROBIC AND ANAEROBIC Blood Culture adequate volume Performed at [facility], [address] [place], [place] [address]    Culture  Setup Time   Final    GRAM POSITIVE COCCI IN BOTH AEROBIC AND ANAEROBIC BOTTLES CRITICAL VALUE NOTED.  VALUE IS CONSISTENT WITH PREVIOUSLY REPORTED AND CALLED VALUE. Performed at [facility], [address] [place], [place] [address]    Culture STREPTOCOCCUS PNEUMONIAE (A)  Final   Report Status 12/25/2017 FINAL  Final   Organism ID, Bacteria STREPTOCOCCUS PNEUMONIAE  Final      Susceptibility   Streptococcus pneumoniae - MIC*    ERYTHROMYCIN >=8 RESISTANT Resistant     LEVOFLOXACIN 1 SENSITIVE Sensitive     PENICILLIN (meningitis) 1 RESISTANT Resistant     PENICILLIN  (non-meningitis) 1 SENSITIVE Sensitive     CEFTRIAXONE (non-meningitis) 1 SENSITIVE Sensitive     CEFTRIAXONE (meningitis) 1 INTERMEDIATE Intermediate     * STREPTOCOCCUS PNEUMONIAE  Blood Culture ID Panel (Reflexed)     Status: Abnormal   Collection Time: 12/22/17  9:31 PM  Result Value Ref Range Status   Enterococcus species NOT DETECTED NOT DETECTED Final   Listeria monocytogenes NOT  DETECTED NOT DETECTED Final   Staphylococcus species NOT DETECTED NOT DETECTED Final   Staphylococcus aureus NOT DETECTED NOT DETECTED Final   Streptococcus species DETECTED (A) NOT DETECTED Final    Comment: CRITICAL RESULT CALLED TO, READ BACK BY AND VERIFIED WITH:  LISA KLUTTZ AT [id number] 12/23/17 SDR    Streptococcus agalactiae NOT DETECTED NOT DETECTED Final   Streptococcus pneumoniae DETECTED (A) NOT DETECTED Final    Comment: CRITICAL RESULT CALLED TO, READ BACK BY AND VERIFIED WITH:  LISA KLUTTZ AT 7672 12/23/17 SDR    Streptococcus pyogenes NOT DETECTED NOT DETECTED Final   Acinetobacter baumannii NOT DETECTED NOT DETECTED Final   Enterobacteriaceae species NOT DETECTED NOT DETECTED Final   Enterobacter cloacae complex NOT DETECTED NOT DETECTED Final   Escherichia coli NOT DETECTED NOT DETECTED Final   Klebsiella oxytoca NOT DETECTED NOT DETECTED Final   Klebsiella pneumoniae NOT DETECTED NOT DETECTED Final   Proteus species NOT DETECTED NOT DETECTED Final   Serratia marcescens NOT DETECTED NOT DETECTED Final   Haemophilus influenzae NOT DETECTED NOT DETECTED Final   Neisseria meningitidis NOT DETECTED NOT DETECTED Final   Pseudomonas aeruginosa NOT DETECTED NOT DETECTED Final   Candida albicans NOT DETECTED NOT DETECTED Final   Candida glabrata NOT DETECTED NOT DETECTED Final   Candida krusei NOT DETECTED NOT DETECTED Final   Candida parapsilosis NOT DETECTED NOT DETECTED Final   Candida tropicalis NOT DETECTED NOT DETECTED Final    Comment: Performed at [facility], [address] [place], [place] [address]  MRSA PCR Screening     Status: Abnormal   Collection Time: 12/23/17  8:47 AM  Result Value Ref Range Status   MRSA by PCR POSITIVE (A) NEGATIVE Final    Comment:        The GeneXpert MRSA Assay (FDA approved for NASAL specimens only), is one component of a comprehensive MRSA colonization surveillance program. It is not intended to diagnose MRSA infection nor to guide or monitor treatment for MRSA infections. RESULT CALLED TO, READ BACK BY AND VERIFIED WITH: C/ MYRA FLOWERS '@1020'  12/23/17 [facility] Performed at [facility], [address] [place], [place] [address]   Culture, respiratory (NON-Expectorated)     Status: None   Collection Time: 12/23/17 12:24 PM  Result Value Ref Range Status   Specimen Description   Final    SPUTUM Performed at [facility], [address], [place], [place] [address]    Special Requests   Final    NONE Performed at [facility], [address] [place], [place] [address]    Gram Stain   Final    ABUNDANT WBC PRESENT,BOTH PMN AND MONONUCLEAR FEW GRAM POSITIVE COCCI    Culture   Final    Consistent with normal respiratory flora. Performed at [facility], [place] [address], [place], [place] [address]    Report Status 12/26/2017 FINAL  Final  CULTURE, BLOOD (ROUTINE X 2) w Reflex to ID Panel     Status: None (Preliminary result)   Collection Time: 12/26/17 11:27 AM  Result Value Ref Range Status   Specimen Description BLOOD LT [facility]  Final   Special Requests   Final    BOTTLES DRAWN AEROBIC AND ANAEROBIC Blood Culture adequate volume   Culture   Final    NO GROWTH 2 DAYS Performed at [facility], [address], [place], [place] [address]    Report Status PENDING  Incomplete  CULTURE, BLOOD (ROUTINE X 2)  w Reflex to ID Panel     Status: None (Preliminary result)   Collection Time: 12/26/17 11:27 AM  Result Value Ref Range Status   Specimen Description BLOOD LT [facility]  Final    Special Requests   Final    BOTTLES DRAWN AEROBIC AND ANAEROBIC Blood Culture adequate volume   Culture   Final    NO GROWTH 2 DAYS Performed at [facility], [address] [place], [place] [address]    Report Status PENDING  Incomplete  C difficile quick scan w PCR reflex     Status: None   Collection Time: 12/27/17  8:46 AM  Result Value Ref Range Status   C Diff antigen NEGATIVE NEGATIVE Final   C Diff toxin NEGATIVE NEGATIVE Final   C Diff interpretation No C. difficile detected.  Final    Comment: Performed at [facility], [address] [place], [place] [address]     Studies: No results found.  Scheduled Meds: . aspirin EC  81 mg Oral Daily  . budesonide (PULMICORT) nebulizer solution  0.25 mg Nebulization BID  . chlorhexidine gluconate (MEDLINE KIT)  15 mL Mouth Rinse BID  . Chlorhexidine Gluconate Cloth  6 each Topical Q0600  . dexamethasone  2 mg Intravenous Q12H  . gabapentin  800 mg Oral TID  . heparin  5,000 Units Subcutaneous Q8H  . insulin aspart  0-9 Units Subcutaneous Q4H  . insulin aspart  3 Units Subcutaneous TID WC  . insulin glargine  5 Units Subcutaneous QHS  . ipratropium-albuterol  3 mL Nebulization Q6H  . multivitamin with minerals  1 tablet Oral Daily  . mupirocin ointment  1 application Nasal BID  . protein supplement shake  2 oz Oral TID WC  . senna-docusate  1 tablet Oral BID  . sertraline  100 mg Oral Daily  . sodium chloride flush  10-40 mL Intracatheter Q12H   Continuous Infusions: . sodium chloride Stopped (12/26/17 1400)  . cefTRIAXone (ROCEPHIN)  IV Stopped (12/28/17 1058)  . vancomycin Stopped (12/28/17 0237)    Assessment/Plan:  1. Severe sepsis with Streptococcus pneumoniae in blood cultures.  Patient also has multifocal pneumonia patient on Rocephin and vancomycin 2. Acute hypoxic respiratory failure.  Patient off high flow nasal cannula.  I was able to taper down to 1 L of oxygen.  Pulse ox today 73% on room  air.  Hopefully as he gets better air entry we will be able to get him off the oxygen. 3. Diabetic ketoacidosis.  This has improved and patient on Lantus and sliding scale.  Patient had hypoglycemic episode yesterday evening.  I will stop the D5 drip and have him on lower dose Lantus. 4. Influenza A positive.  completed Tamiflu 5. Acute kidney injury on chronic kidney disease.  This has improved a bit with IV fluid hydration.  Creatinine down to 1.27 6. History of seizure on Keppra  Code Status:     Code Status Orders  (From admission, onward)        Start     Ordered   12/23/17 0034  Full code  Continuous     12/23/17 0033    Code Status History    Date Active Date Inactive Code Status Order ID Comments User Context   11/28/2015 0059 11/30/2015 1346 Full Code [id number]  Lance Coon, MD Inpatient   06/02/2015 1002 06/04/2015 1455 Full Code [id number]  Gladstone Lighter, MD Inpatient     Disposition Plan:  once I can get  him off oxygen he can potentially go home.  Antibiotics:  Vancomycin  Rocephin  Tamiflu finished  Time spent: 28 minutes  [place]

## 2017-12-29 LAB — BASIC METABOLIC PANEL
Anion gap: 7 (ref 5–15)
BUN: 28 mg/dL — AB (ref 6–20)
CHLORIDE: 99 mmol/L — AB (ref 101–111)
CO2: 29 mmol/L (ref 22–32)
CREATININE: 1.29 mg/dL — AB (ref 0.61–1.24)
Calcium: 7.7 mg/dL — ABNORMAL LOW (ref 8.9–10.3)
GFR calc Af Amer: >60 mL/min (ref >60)
GFR calc non Af Amer: >60 mL/min (ref >60)
GLUCOSE: 198 mg/dL — AB (ref 65–99)
Potassium: 4 mmol/L (ref 3.5–5.1)
Sodium: 135 mmol/L (ref 135–145)

## 2017-12-29 LAB — CBC
HCT: 21.8 % — ABNORMAL LOW (ref 40.0–52.0)
Hemoglobin: 7.2 g/dL — ABNORMAL LOW (ref 13.0–18.0)
MCH: 30 pg (ref 26.0–34.0)
MCHC: 33 g/dL (ref 32.0–36.0)
MCV: 90.9 fL (ref 80.0–100.0)
PLATELETS: 157 10*3/uL (ref 150–440)
RBC: 2.4 MIL/uL — ABNORMAL LOW (ref 4.40–5.90)
RDW: 13.7 % (ref 11.5–14.5)
WBC: 9.7 10*3/uL (ref 3.8–10.6)

## 2017-12-29 LAB — GLUCOSE, CAPILLARY
GLUCOSE-CAPILLARY: 256 mg/dL — AB (ref 65–99)
Glucose-Capillary: 127 mg/dL — ABNORMAL HIGH (ref 65–99)
Glucose-Capillary: 134 mg/dL — ABNORMAL HIGH (ref 65–99)
Glucose-Capillary: 145 mg/dL — ABNORMAL HIGH (ref 65–99)
Glucose-Capillary: 163 mg/dL — ABNORMAL HIGH (ref 65–99)
Glucose-Capillary: 183 mg/dL — ABNORMAL HIGH (ref 65–99)
Glucose-Capillary: 19 mg/dL — CL (ref 65–99)
Glucose-Capillary: 256 mg/dL — ABNORMAL HIGH (ref 65–99)
Glucose-Capillary: 36 mg/dL — CL (ref 65–99)
Glucose-Capillary: 372 mg/dL — ABNORMAL HIGH (ref 65–99)
Glucose-Capillary: <10 mg/dL — CL (ref 65–99)

## 2017-12-29 MED ORDER — INSULIN GLARGINE 100 UNIT/ML ~~LOC~~ SOLN
7.0000 [IU] | Freq: Every day | SUBCUTANEOUS | Status: DC
  Administered 2017-12-29: 7 [IU] via SUBCUTANEOUS
  Filled 2017-12-29 (×2): qty 0.07

## 2017-12-29 MED ORDER — SODIUM CHLORIDE 0.9 % IV BOLUS
250.0000 mL | Freq: Once | INTRAVENOUS | Status: AC
  Administered 2017-12-29: 250 mL via INTRAVENOUS

## 2017-12-29 MED ORDER — INSULIN ASPART 100 UNIT/ML ~~LOC~~ SOLN
1.0000 [IU] | Freq: Three times a day (TID) | SUBCUTANEOUS | Status: DC
  Administered 2017-12-29: 3 [IU] via SUBCUTANEOUS
  Administered 2017-12-29: 5 [IU] via SUBCUTANEOUS
  Administered 2017-12-30: 3 [IU] via SUBCUTANEOUS
  Administered 2017-12-30: 2 [IU] via SUBCUTANEOUS
  Administered 2017-12-30: 4 [IU] via SUBCUTANEOUS
  Administered 2017-12-31: 2 [IU] via SUBCUTANEOUS
  Filled 2017-12-29 (×7): qty 1

## 2017-12-29 MED ORDER — NYSTATIN 100000 UNIT/ML MT SUSP
5.0000 mL | Freq: Four times a day (QID) | OROMUCOSAL | Status: DC
  Administered 2017-12-29 – 2017-12-31 (×6): 500000 [IU] via ORAL
  Filled 2017-12-29 (×7): qty 5

## 2017-12-29 MED ORDER — FAMOTIDINE 20 MG PO TABS
20.0000 mg | ORAL_TABLET | Freq: Every day | ORAL | Status: DC
  Administered 2017-12-29 – 2017-12-31 (×3): 20 mg via ORAL
  Filled 2017-12-29 (×3): qty 1

## 2017-12-29 MED ORDER — INSULIN GLARGINE 100 UNIT/ML ~~LOC~~ SOLN
4.0000 [IU] | Freq: Every day | SUBCUTANEOUS | Status: DC
  Filled 2017-12-29: qty 0.04

## 2017-12-29 MED ORDER — LORAZEPAM 2 MG/ML IJ SOLN
2.0000 mg | Freq: Four times a day (QID) | INTRAMUSCULAR | Status: DC | PRN
  Administered 2017-12-29 – 2017-12-30 (×2): 2 mg via INTRAVENOUS
  Filled 2017-12-29 (×2): qty 1

## 2017-12-29 NOTE — Progress Notes (Signed)
[place] INFECTIOUS DISEASE PROGRESS NOTE Date of Admission:  12/22/2017     ID: Maurice Davidson is a 32 y.o. male with Strep PNA bacteremia and Multifocal PNA Principal Problem:   Severe sepsis ([place]) Active Problems:   Seizures ([place])   DKA (diabetic ketoacidoses) (HCC)   Multifocal pneumonia   Acute renal failure superimposed on stage 3 chronic kidney disease (HCC)   Aspiration pneumonia (HCC)   Subjective: Transferred out of unit, remains on O2. No fevers, wbc 9  ROS  Eleven systems are reviewed and negative except per hpi  Medications:  Antibiotics Given (last 72 hours)    Date/Time Action Medication Dose Rate   12/26/17 1506 New Bag/Given   vancomycin (VANCOCIN) IVPB 750 mg/150 ml premix 750 mg 150 mL/hr   12/26/17 2135 New Bag/Given   cefTRIAXone (ROCEPHIN) 2 g in sodium chloride 0.9 % 100 mL IVPB 2 g 200 mL/hr   12/27/17 0038 New Bag/Given   vancomycin (VANCOCIN) IVPB 750 mg/150 ml premix 750 mg 150 mL/hr   12/27/17 1016 New Bag/Given   cefTRIAXone (ROCEPHIN) 2 g in sodium chloride 0.9 % 100 mL IVPB 2 g 200 mL/hr   12/27/17 2143 New Bag/Given   cefTRIAXone (ROCEPHIN) 2 g in sodium chloride 0.9 % 100 mL IVPB 2 g 200 mL/hr   12/28/17 0103 New Bag/Given   vancomycin (VANCOCIN) IVPB 750 mg/150 ml premix 750 mg 150 mL/hr   12/28/17 1012 New Bag/Given   cefTRIAXone (ROCEPHIN) 2 g in sodium chloride 0.9 % 100 mL IVPB 2 g 200 mL/hr   12/28/17 2119 New Bag/Given   cefTRIAXone (ROCEPHIN) 2 g in sodium chloride 0.9 % 100 mL IVPB 2 g 200 mL/hr   12/29/17 0006 New Bag/Given   vancomycin (VANCOCIN) IVPB 750 mg/150 ml premix 750 mg 150 mL/hr   12/29/17 0809 New Bag/Given   cefTRIAXone (ROCEPHIN) 2 g in sodium chloride 0.9 % 100 mL IVPB 2 g 200 mL/hr     . aspirin EC  81 mg Oral Daily  . budesonide (PULMICORT) nebulizer solution  0.25 mg Nebulization BID  . buprenorphine-naloxone  1 tablet Sublingual Daily  . chlorhexidine gluconate (MEDLINE KIT)  15 mL Mouth Rinse BID  .  Chlorhexidine Gluconate Cloth  6 each Topical Q0600  . dexamethasone  2 mg Intravenous Q12H  . gabapentin  800 mg Oral TID  . heparin  5,000 Units Subcutaneous Q8H  . insulin aspart  1-5 Units Subcutaneous TID AC & HS  . insulin glargine  4 Units Subcutaneous QHS  . ipratropium-albuterol  3 mL Nebulization Q6H  . multivitamin with minerals  1 tablet Oral Daily  . mupirocin ointment  1 application Nasal BID  . nystatin  5 mL Oral QID  . protein supplement shake  2 oz Oral TID WC  . senna-docusate  1 tablet Oral BID  . sertraline  100 mg Oral Daily  . sodium chloride flush  10-40 mL Intracatheter Q12H    Objective: Vital signs in last 24 hours: Temp:  [97.4 F (36.3 C)-98.7 F (37.1 C)] 97.5 F (36.4 C) (04/15 0751) Pulse Rate:  [92-108] 102 (04/15 0751) Resp:  [20] 20 (04/14 2300) BP: (92-121)/(60-84) 109/84 (04/15 0751) SpO2:  [92 %-99 %] 97 % (04/15 1100) Weight:  [44.5 kg (98 lb 1.7 oz)] 44.5 kg (98 lb 1.7 oz) (04/15 0500) Physical Exam  Constitutional: He is oriented to person, place, and time.thin, up and walking around, using bathroom HENT: anicteric Mouth/Throat: Oropharynx is clear and moist. Poor  dentition  No oropharyngeal exudate.  Cardiovascular: Normal rate, regular rhythm and normal heart sounds. Exam reveals no gallop and no friction rub.  No murmur heard.  Pulmonary/Chest: mod rhonchi bil, mild wheeze  Abdominal: Soft. Bowel sounds are normal. He exhibits no distension. There is no tenderness.  Lymphadenopathy: He has no cervical adenopathy.  Neurological: He is alert and oriented to person, place, and time.  Skin: Skin is warm and dry. No rash noted. No erythema.  Psychiatric: He has a normal mood and affect. His behavior is normal.   Lab Results Recent Labs    12/27/17 0425 12/29/17 0357  WBC  --  9.7  HGB  --  7.2*  HCT  --  21.8*  NA 139 135  K 3.8 4.0  CL 101 99*  CO2 28 29  BUN 35* 28*  CREATININE 1.27* 1.29*    Microbiology: Results for  orders placed or performed during the hospital encounter of 12/22/17  Blood Culture (routine x 2)     Status: Abnormal   Collection Time: 12/22/17  9:31 PM  Result Value Ref Range Status   Specimen Description   Final    BLOOD RIGHT ANTECUBITAL Performed at [facility], [address], [place], [place] [address]    Special Requests   Final    BOTTLES DRAWN AEROBIC AND ANAEROBIC Blood Culture results may not be optimal due to an excessive volume of blood received in culture bottles Performed at [facility], [address] [place], [place] [address]    Culture  Setup Time   Final    Organism ID to follow [employer] CRITICAL RESULT CALLED TO, READ BACK BY AND VERIFIED WITH: LISA KLUTTZ AT 6712 12/23/17 [place] Performed at [facility], [address] [place], [place] [address]    Culture (A)  Final    STREPTOCOCCUS PNEUMONIAE SUSCEPTIBILITIES PERFORMED ON PREVIOUS CULTURE WITHIN THE LAST 5 DAYS. Performed at [facility], [place] [address], [place], [place] [address]    Report Status 12/25/2017 FINAL  Final  Blood Culture (routine x 2)     Status: Abnormal   Collection Time: 12/22/17  9:31 PM  Result Value Ref Range Status   Specimen Description   Final    BLOOD BLOOD LEFT FOREARM Performed at [facility], [address] [place], [place] [address]    Special Requests   Final    BOTTLES DRAWN AEROBIC AND ANAEROBIC Blood Culture adequate volume Performed at [facility], [address] [place], [place] [address]    Culture  Setup Time   Final    GRAM POSITIVE COCCI IN BOTH AEROBIC AND ANAEROBIC BOTTLES CRITICAL VALUE NOTED.  VALUE IS CONSISTENT WITH PREVIOUSLY REPORTED AND CALLED VALUE. Performed at [facility], [address] [place], [place] [address]    Culture STREPTOCOCCUS PNEUMONIAE (A)  Final   Report Status 12/25/2017 FINAL  Final   Organism ID, Bacteria  STREPTOCOCCUS PNEUMONIAE  Final      Susceptibility   Streptococcus pneumoniae - MIC*    ERYTHROMYCIN >=8 RESISTANT Resistant     LEVOFLOXACIN 1 SENSITIVE Sensitive     PENICILLIN (meningitis) 1 RESISTANT Resistant     PENICILLIN (non-meningitis) 1 SENSITIVE Sensitive     CEFTRIAXONE (non-meningitis) 1 SENSITIVE Sensitive     CEFTRIAXONE (meningitis) 1 INTERMEDIATE Intermediate     * STREPTOCOCCUS PNEUMONIAE  Blood Culture ID Panel (Reflexed)     Status: Abnormal  Collection Time: 12/22/17  9:31 PM  Result Value Ref Range Status   Enterococcus species NOT DETECTED NOT DETECTED Final   Listeria monocytogenes NOT DETECTED NOT DETECTED Final   Staphylococcus species NOT DETECTED NOT DETECTED Final   Staphylococcus aureus NOT DETECTED NOT DETECTED Final   Streptococcus species DETECTED (A) NOT DETECTED Final    Comment: CRITICAL RESULT CALLED TO, READ BACK BY AND VERIFIED WITH:  LISA KLUTTZ AT 6283 12/23/17 S[place]    Streptococcus agalactiae NOT DETECTED NOT DETECTED Final   Streptococcus pneumoniae DETECTED (A) NOT DETECTED Final    Comment: CRITICAL RESULT CALLED TO, READ BACK BY AND VERIFIED WITH:  LISA KLUTTZ AT 6629 12/23/17 SDR    Streptococcus pyogenes NOT DETECTED NOT DETECTED Final   Acinetobacter baumannii NOT DETECTED NOT DETECTED Final   Enterobacteriaceae species NOT DETECTED NOT DETECTED Final   Enterobacter cloacae complex NOT DETECTED NOT DETECTED Final   Escherichia coli NOT DETECTED NOT DETECTED Final   Klebsiella oxytoca NOT DETECTED NOT DETECTED Final   Klebsiella pneumoniae NOT DETECTED NOT DETECTED Final   Proteus species NOT DETECTED NOT DETECTED Final   Serratia marcescens NOT DETECTED NOT DETECTED Final   Haemophilus influenzae NOT DETECTED NOT DETECTED Final   Neisseria meningitidis NOT DETECTED NOT DETECTED Final   Pseudomonas aeruginosa NOT DETECTED NOT DETECTED Final   Candida albicans NOT DETECTED NOT DETECTED Final   Candida glabrata NOT DETECTED NOT  DETECTED Final   Candida krusei NOT DETECTED NOT DETECTED Final   Candida parapsilosis NOT DETECTED NOT DETECTED Final   Candida tropicalis NOT DETECTED NOT DETECTED Final    Comment: Performed at [facility], [address] [place], [place] [address]  MRSA PCR Screening     Status: Abnormal   Collection Time: 12/23/17  8:47 AM  Result Value Ref Range Status   MRSA by PCR POSITIVE (A) NEGATIVE Final    Comment:        The GeneXpert MRSA Assay (FDA approved for NASAL specimens only), is one component of a comprehensive MRSA colonization surveillance program. It is not intended to diagnose MRSA infection nor to guide or monitor treatment for MRSA infections. RESULT CALLED TO, READ BACK BY AND VERIFIED WITH: C/ MYRA FLOWERS _0  12/23/17 [facility] Performed at [facility], [address] [place], [place] [address]   Culture, respiratory (NON-Expectorated)     Status: None   Collection Time: 12/23/17 12:24 PM  Result Value Ref Range Status   Specimen Description   Final    SPUTUM Performed at [facility], [address], [place], [place] [address]    Special Requests   Final    NONE Performed at [facility], [address] [place], [place] [address]    Gram Stain   Final    ABUNDANT WBC PRESENT,BOTH PMN AND MONONUCLEAR FEW GRAM POSITIVE COCCI    Culture   Final    Consistent with normal respiratory flora. Performed at [facility], [place] [address] [place],  [address]    Report Status 12/26/2017 FINAL  Final  CULTURE, BLOOD (ROUTINE X 2) w Reflex to ID Panel     Status: None (Preliminary result)   Collection Time: 12/26/17 11:27 AM  Result Value Ref Range Status   Specimen Description BLOOD LT [facility]  Final   Special Requests   Final    BOTTLES DRAWN AEROBIC AND ANAEROBIC Blood Culture adequate volume   Culture   Final    NO GROWTH 3 DAYS  Performed at [facility], [address] [place], [place] [address]     Report Status PENDING  Incomplete  CULTURE, BLOOD (ROUTINE X 2) w Reflex to ID Panel     Status: None (Preliminary result)   Collection Time: 12/26/17 11:27 AM  Result Value Ref Range Status   Specimen Description BLOOD LT [facility]  Final   Special Requests   Final    BOTTLES DRAWN AEROBIC AND ANAEROBIC Blood Culture adequate volume   Culture   Final    NO GROWTH 3 DAYS Performed at [facility], [address], [place],  [address]    Report Status PENDING  Incomplete  C difficile quick scan w PCR reflex     Status: None   Collection Time: 12/27/17  8:46 AM  Result Value Ref Range Status   C Diff antigen NEGATIVE NEGATIVE Final   C Diff toxin NEGATIVE NEGATIVE Final   C Diff interpretation No C. difficile detected.  Final    Comment: Performed at [facility], [address] [place],  [address]    Studies/Results: No results found.  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 Initially intubated but now on Hi flo O2. Was very lethargic after extubation and there was concern for meningitis, however much more alert today. CT head neg except for extensive sinus disease. PC improving, HIV neg.  He was started on vanco in addition to ctx which was increased from qd to q 12 hours on 4/11 and dexamethasome was added.  His Strep PNA is relatively resistant especially if needed to treat meningitis.   4/15- no fevers, out of unit. Echo neg for veg. Much much more alert but sugars low to 19. Day 8 of abx coverage for Strep PNA.  Recommendations At this point much less likely to have had meningitis. AMS was likely multifactorial. Can stop vancomycin Cont ceftriaxone Q 12 but at dc can send home on either oral levofloxacin 750 qd for total course of 14 days or amoxicillin 1000 mg bid for same duration. Can stop dexamethasone if ok with hospital service Thank you very much for  the consult. Will follow with you.  Leonel Ramsay   12/29/2017, 11:33 AM

## 2017-12-29 NOTE — Progress Notes (Signed)
Inpatient Diabetes Program Recommendations  AACE/ADA: New Consensus Statement on Inpatient Glycemic Control (2015)  Target Ranges:  Prepandial:   less than 140 mg/dL      [place] postprandial:   less than 180 mg/dL (1-2 hours)      Critically ill patients:  140 - 180 mg/dL   Lab Results  Component Value Date   GLUCAP 19 (LL) 12/29/2017   HGBA1C 9.1 10/23/2017    Review of Glycemic ControlResults for Maurice Davidson, Maurice Davidson (MRN [id number]) as of 12/29/2017 10:03  Ref. Range 12/28/2017 21:06 12/29/2017 00:05 12/29/2017 03:47 12/29/2017 07:54 12/29/2017 09:46  Glucose-Capillary Latest Ref Range: 65 - 99 mg/dL 139 (H) 134 (H) 183 (H) 127 (H) 19 (LL)   Diabetes history:DM1 (makes no insulin; requires basal, correction, and meal coverage) Outpatient Diabetes medications:Lantus 10 units QHS, Novolog 5 units TID with meals plus additional 1 unit for every 12 grams of carbs Current orders for Inpatient glycemic control:  Novolog sensitive q 4 hours, Lantus 5 units q HS, Novolog 3 units tid with meals  Inpatient Diabetes Program Recommendations:  Note hypoglycemia today.  Please consider reducing Novolog correction to custom scale that starts at 151 mg/dL-200 mg/dL- 1 unit, 201-250 mg/dL-2 units, 251-300 mg/dL-3 units, 301-350 mg/dL- 4 units, 351-400 mg/dL- 5 units. Also consider changing Novolog correction to tid with meals and HS.  He likely will need slight increase of Lantus to 8 units q HS.    Thanks,  Adah Perl, RN, BC-ADM Inpatient Diabetes Coordinator Pager [phone] (8a-5p)

## 2017-12-29 NOTE — Progress Notes (Signed)
Inpatient Diabetes Program Recommendations  AACE/ADA: New Consensus Statement on Inpatient Glycemic Control (2015)  Target Ranges:  Prepandial:   less than 140 mg/dL      [place] postprandial:   less than 180 mg/dL (1-2 hours)      Critically ill patients:  140 - 180 mg/dL   Lab Results  Component Value Date   GLUCAP 145 (H) 12/29/2017   HGBA1C 9.1 10/23/2017    Spoke with patient regarding home DM control.  He states that he was diagnosed with DM at age 32.  He has not seen and endocrinologist in years and is very interested in follow-up with endocrinology after d/c.  He also states that he would like to use pens instead of vial and syringe due to convenience.  He thought that this was not possible due to medicaid, however Medicaid does cover certain types of insulin pens.   We discussed low blood sugars and patient states that he was really scared when blood sugar dropped this morning.  He states that he rarely has lows at home.  He counts Carbohydrates and covers 1 unit for every 15 grams of CHO.  He is interested in possibly getting an insulin pump in the future.  Encouraged him to let RN know if he feels like his blood sugars are dropping at any time.  Patient appreciative of visit and states that he wants to do better with his diabetes.    MD, please consider referring patient to outpatient endocrinologist after d/c.  Also at discharge, consider insulin pens for insulin delivery and pen needles.  Also consider restarting Novolog meal coverage 2 units tid with meals to cover CHO.   Thanks,   Adah Perl, RN, BC-ADM Inpatient Diabetes Coordinator Pager [phone] (8a-5p)

## 2017-12-29 NOTE — Progress Notes (Signed)
SATURATION QUALIFICATIONS: (This note is used to comply with regulatory documentation for home oxygen)  Patient Saturations on Room Air at Rest = *97%  Patient Saturations on Room Air while Ambulating = 79%  Patient Saturations on 3 Liters of oxygen while Ambulating = 92%  Please briefly explain why patient needs home oxygen: ambulating in room patient sats 92% on RA, walking in hall (200 feet) on RA, patient sats drop to 79%.

## 2017-12-29 NOTE — Progress Notes (Signed)
Patient BP 80/39,  Asymptomatic, O2 sats decreased, placed back on O2. MD notified.

## 2017-12-29 NOTE — Progress Notes (Signed)
PT Cancellation Note  Patient Details Name: Maurice Davidson MRN: [id number] DOB: 07/09/1985   Cancelled Treatment:    Reason Eval/Treat Not Completed: PT screened, no needs identified, will sign off.  Per discussion with nurse this morning, no acute PT needs identified (per chart review pt is ambulatory in room and also walking in hall).  D/t this, will discontinue current PT order.  Leitha Bleak, PT 12/29/17, 4:43 PM [phone]

## 2017-12-29 NOTE — Progress Notes (Signed)
Patient ID: Maurice Davidson, male   DOB: 07/09/1985, 32 y.o.   MRN: [id number]  [facility] PROGRESS NOTE  Maurice Davidson JFH:[id number] DOB: 07/09/1985 DOA: 12/22/2017 PCP: Trinna Post, PA-C  HPI/Subjective: Patient feeling better today.  Still with some cough and shortness of breath.  I took him off the oxygen this morning.  Objective: Vitals:   12/29/17 1100 12/29/17 1331  BP:    Pulse:    Resp:    Temp:    SpO2: 97% 96%    Filed Weights   12/27/17 0400 12/28/17 0500 12/29/17 0500  Weight: 43.3 kg (95 lb 7.4 oz) 43.5 kg (96 lb) 44.5 kg (98 lb 1.7 oz)    ROS: Review of Systems  Constitutional: Negative for chills and fever.  Eyes: Negative for blurred vision.  Respiratory: Positive for cough and shortness of breath.   Cardiovascular: Negative for chest pain.  Gastrointestinal: Negative for abdominal pain, constipation, diarrhea, nausea and vomiting.  Genitourinary: Negative for dysuria.  Musculoskeletal: Negative for joint pain.  Neurological: Negative for dizziness and headaches.   Exam: Physical Exam  HENT:  Nose: No mucosal edema.  Mouth/Throat: No oropharyngeal exudate or posterior oropharyngeal edema.  Thrush in the mouth.  Eyes: Pupils are equal, round, and reactive to light. Conjunctivae, EOM and lids are normal.  Neck: No JVD present. Carotid bruit is not present. No edema present. No thyroid mass and no thyromegaly present.  Cardiovascular: S1 normal and S2 normal. Exam reveals no gallop.  No murmur heard. Pulses:      Dorsalis pedis pulses are 2+ on the right side, and 2+ on the left side.  Respiratory: No respiratory distress. He has no wheezes. He has rhonchi in the right lower field and the left lower field. He has no rales.  GI: Soft. Bowel sounds are normal. There is no tenderness.  Musculoskeletal:       Right shoulder: He exhibits no swelling.       Right ankle: He exhibits no swelling.       Left ankle: He exhibits no swelling.   Lymphadenopathy:    He has no cervical adenopathy.  Neurological: He is alert. No cranial nerve deficit.  Skin: Skin is warm. No rash noted. Nails show no clubbing.  Psychiatric: He has a normal mood and affect.      Data Reviewed: Basic Metabolic Panel: Recent Labs  Lab 12/23/17 1355 12/23/17 1653 12/24/17 0511  12/25/17 1830 12/26/17 0501 12/26/17 1959 12/27/17 0425 12/29/17 0357  NA 143 142 140   < > 147* 143 143 139 135  K 3.2* 2.7* 3.6   < > 3.2* 4.1 4.0 3.8 4.0  CL 107 106 104   < > 108 105 106 101 99*  CO2 _0 < > 33* _1 GLUCOSE 239* 204* 309*   < > 31* 229* 42* 154* 198*  BUN 85* 84* 80*   < > 52* 43* 35* 35* 28*  CREATININE 2.68* 2.68* 2.70*   < > 1.75* 1.59* 1.33* 1.27* 1.29*  CALCIUM 7.0* 6.9* 7.1*   < > 7.8* 7.8* 8.6* 8.5* 7.7*  MG 2.6* 2.5* 2.6*  --   --  1.3*  --  1.7  --   PHOS <1.0* <1.0* 1.4*  --  1.9* 3.2  --  2.2*  --    < > = values in this interval not displayed.   Liver Function Tests: Recent Labs  Lab 12/22/17 1956 12/23/17  2108 12/25/17 0645 12/26/17 0501  AST 42*  --  104* 62*  ALT 17  --  49 49  ALKPHOS 136*  --  323* 342*  BILITOT 1.8*  --  0.6 0.9  PROT 6.5  --  6.1* 6.7  ALBUMIN 2.8* 1.9* 2.1* 2.3*   No results for input(s): LIPASE, AMYLASE in the last 168 hours. Recent Labs  Lab 12/27/17 0425  AMMONIA 12   CBC: Recent Labs  Lab 12/22/17 1956 12/23/17 0240 12/23/17 0535 12/25/17 0645 12/26/17 0501 12/29/17 0357  WBC 10.3 7.5 7.2 14.2* 12.3* 9.7  NEUTROABS 9.4*  --   --   --   --   --   HGB 10.8* 11.5* 11.3* 9.3* 9.0* 7.2*  HCT 36.5* 34.8* 35.0* 27.6* 26.6* 21.8*  MCV 101.2* 92.8 91.9 89.1 89.4 90.9  PLT 276 249 277 147* 130* 157   CBG: Recent Labs  Lab 12/29/17 0754 12/29/17 0946 12/29/17 1008 12/29/17 1025 12/29/17 1147  GLUCAP 127* 19* <10* 163* 145*    Recent Results (from the past 240 hour(s))  Blood Culture (routine x 2)     Status: Abnormal   Collection Time: 12/22/17  9:31 PM   Result Value Ref Range Status   Specimen Description   Final    BLOOD RIGHT ANTECUBITAL Performed at [facility], [address] [place], [place] [address]    Special Requests   Final    BOTTLES DRAWN AEROBIC AND ANAEROBIC Blood Culture results may not be optimal due to an excessive volume of blood received in culture bottles Performed at [facility], [address] [place], [place] [address]    Culture  Setup Time   Final    Organism ID to follow [place] TO, READ BACK BY AND VERIFIED WITH: LISA KLUTTZ AT 5449 12/23/17 [place] Performed at [facility], [address] [place], [place] [address]    Culture (A)  Final    STREPTOCOCCUS PNEUMONIAE SUSCEPTIBILITIES PERFORMED ON PREVIOUS CULTURE WITHIN THE LAST 5 DAYS. Performed at [facility], [place] [address] [place], [place] [address]    Report Status 12/25/2017 FINAL  Final  Blood Culture (routine x 2)     Status: Abnormal   Collection Time: 12/22/17  9:31 PM  Result Value Ref Range Status   Specimen Description   Final    BLOOD BLOOD LEFT FOREARM Performed at [facility], [address], [place], [place] [address]    Special Requests   Final    BOTTLES DRAWN AEROBIC AND ANAEROBIC Blood Culture adequate volume Performed at [facility], [address] [place], [place] [address]    Culture  Setup Time   Final    GRAM POSITIVE COCCI IN BOTH AEROBIC AND ANAEROBIC BOTTLES CRITICAL VALUE NOTED.  VALUE IS CONSISTENT WITH PREVIOUSLY REPORTED AND CALLED VALUE. Performed at [facility], [address] [place], [place] [address]    Culture STREPTOCOCCUS PNEUMONIAE (A)  Final   Report Status 12/25/2017 FINAL  Final   Organism ID, Bacteria STREPTOCOCCUS PNEUMONIAE  Final      Susceptibility   Streptococcus pneumoniae - MIC*    ERYTHROMYCIN >=8 RESISTANT Resistant     LEVOFLOXACIN 1 SENSITIVE  Sensitive     PENICILLIN (meningitis) 1 RESISTANT Resistant     PENICILLIN (non-meningitis) 1 SENSITIVE Sensitive     CEFTRIAXONE (non-meningitis) 1 SENSITIVE Sensitive     CEFTRIAXONE (meningitis) 1 INTERMEDIATE  Intermediate     * STREPTOCOCCUS PNEUMONIAE  Blood Culture ID Panel (Reflexed)     Status: Abnormal   Collection Time: 12/22/17  9:31 PM  Result Value Ref Range Status   Enterococcus species NOT DETECTED NOT DETECTED Final   Listeria monocytogenes NOT DETECTED NOT DETECTED Final   Staphylococcus species NOT DETECTED NOT DETECTED Final   Staphylococcus aureus NOT DETECTED NOT DETECTED Final   Streptococcus species DETECTED (A) NOT DETECTED Final    Comment: CRITICAL RESULT CALLED TO, READ BACK BY AND VERIFIED WITH:  LISA KLUTTZ AT 9702 12/23/17 SDR    Streptococcus agalactiae NOT DETECTED NOT DETECTED Final   Streptococcus pneumoniae DETECTED (A) NOT DETECTED Final    Comment: CRITICAL RESULT CALLED TO, READ BACK BY AND VERIFIED WITH:  LISA KLUTTZ AT 6378 12/23/17 SDR    Streptococcus pyogenes NOT DETECTED NOT DETECTED Final   Acinetobacter baumannii NOT DETECTED NOT DETECTED Final   Enterobacteriaceae species NOT DETECTED NOT DETECTED Final   Enterobacter cloacae complex NOT DETECTED NOT DETECTED Final   Escherichia coli NOT DETECTED NOT DETECTED Final   Klebsiella oxytoca NOT DETECTED NOT DETECTED Final   Klebsiella pneumoniae NOT DETECTED NOT DETECTED Final   Proteus species NOT DETECTED NOT DETECTED Final   Serratia marcescens NOT DETECTED NOT DETECTED Final   Haemophilus influenzae NOT DETECTED NOT DETECTED Final   Neisseria meningitidis NOT DETECTED NOT DETECTED Final   Pseudomonas aeruginosa NOT DETECTED NOT DETECTED Final   Candida albicans NOT DETECTED NOT DETECTED Final   Candida glabrata NOT DETECTED NOT DETECTED Final   Candida krusei NOT DETECTED NOT DETECTED Final   Candida parapsilosis NOT DETECTED NOT DETECTED Final   Candida tropicalis NOT DETECTED NOT  DETECTED Final    Comment: Performed at [facility], [address] [place], [place] [address]  MRSA PCR Screening     Status: Abnormal   Collection Time: 12/23/17  8:47 AM  Result Value Ref Range Status   MRSA by PCR POSITIVE (A) NEGATIVE Final    Comment:        The GeneXpert MRSA Assay (FDA approved for NASAL specimens only), is one component of a comprehensive MRSA colonization surveillance program. It is not intended to diagnose MRSA infection nor to guide or monitor treatment for MRSA infections. RESULT CALLED TO, READ BACK BY AND VERIFIED WITH: C/ MYRA FLOWERS _0  12/23/17 [facility] Performed at [facility], [address] [place], [place] [address]   Culture, respiratory (NON-Expectorated)     Status: None   Collection Time: 12/23/17 12:24 PM  Result Value Ref Range Status   Specimen Description   Final    SPUTUM Performed at [facility], [address] [place], [place] [address]    Special Requests   Final    NONE Performed at [facility], [address] [place], [place] [address]    Gram Stain   Final    ABUNDANT WBC PRESENT,BOTH PMN AND MONONUCLEAR FEW GRAM POSITIVE COCCI    Culture   Final    Consistent with normal respiratory flora. Performed at [facility], [place] [address] [place], [place] [address]    Report Status 12/26/2017 FINAL  Final  CULTURE, BLOOD (ROUTINE X 2) w Reflex to ID Panel     Status: None (Preliminary result)   Collection Time: 12/26/17 11:27 AM  Result Value Ref Range Status   Specimen Description BLOOD LT [facility]  Final   Special Requests   Final  BOTTLES DRAWN AEROBIC AND ANAEROBIC Blood Culture adequate volume   Culture   Final    NO GROWTH 3 DAYS Performed at [facility], [address] [place], [place] [address]    Report Status PENDING  Incomplete  CULTURE, BLOOD (ROUTINE X 2) w Reflex to ID Panel     Status: None (Preliminary result)   Collection Time: 12/26/17 11:27  AM  Result Value Ref Range Status   Specimen Description BLOOD LT [facility]  Final   Special Requests   Final    BOTTLES DRAWN AEROBIC AND ANAEROBIC Blood Culture adequate volume   Culture   Final    NO GROWTH 3 DAYS Performed at [facility], [address] [place], [place] [address]    Report Status PENDING  Incomplete  C difficile quick scan w PCR reflex     Status: None   Collection Time: 12/27/17  8:46 AM  Result Value Ref Range Status   C Diff antigen NEGATIVE NEGATIVE Final   C Diff toxin NEGATIVE NEGATIVE Final   C Diff interpretation No C. difficile detected.  Final    Comment: Performed at [facility], [address] [place], [place] [address]     Studies: No results found.  Scheduled Meds: . aspirin EC  81 mg Oral Daily  . budesonide (PULMICORT) nebulizer solution  0.25 mg Nebulization BID  . buprenorphine-naloxone  1 tablet Sublingual Daily  . chlorhexidine gluconate (MEDLINE KIT)  15 mL Mouth Rinse BID  . Chlorhexidine Gluconate Cloth  6 each Topical Q0600  . dexamethasone  2 mg Intravenous Q12H  . gabapentin  800 mg Oral TID  . heparin  5,000 Units Subcutaneous Q8H  . insulin aspart  1-5 Units Subcutaneous TID AC & HS  . insulin glargine  4 Units Subcutaneous QHS  . ipratropium-albuterol  3 mL Nebulization Q6H  . multivitamin with minerals  1 tablet Oral Daily  . mupirocin ointment  1 application Nasal BID  . nystatin  5 mL Oral QID  . protein supplement shake  2 oz Oral TID WC  . senna-docusate  1 tablet Oral BID  . sertraline  100 mg Oral Daily  . sodium chloride flush  10-40 mL Intracatheter Q12H   Continuous Infusions: . sodium chloride Stopped (12/26/17 1400)  . cefTRIAXone (ROCEPHIN)  IV Stopped (12/29/17 8242)    Assessment/Plan:  1. Severe sepsis with Streptococcus pneumoniae in blood cultures.  Patient also has multifocal pneumonia patient on Rocephin.  Vancomycin discontinued because this is likely not meningitis. 2. Acute  hypoxic respiratory failure.  Patient  initially on high flow nasal cannula.  Now on regular nasal cannula.  Pulse ox at rest is okay but when he starts walking around his pulse ox does drop into the 70s.  Still will need more time here in the hospital.  He does not have a qualifying diagnosis for oxygen at home.  Tapering Decadron.  Continue nebulizer treatments. 3. Hypoglycemia and diabetes.  Patient was admitted with diabetic ketoacidosis.   custom sliding scale for this patient.  Decrease Lantus dose. 4. Influenza A positive.  completed Tamiflu 5. Acute kidney injury on chronic kidney disease.  This has improved a bit with IV fluid hydration.  Creatinine down to 1.29 6. History of seizure on Keppra 7. Oral thrush start nystatin swish and swallow.  Code Status:     Code Status Orders  (From admission, onward)        Start     Ordered  12/23/17 0034  Full code  Continuous     12/23/17 0033    Code Status History    Date Active Date Inactive Code Status Order ID Comments User Context   11/28/2015 0059 11/30/2015 1346 Full Code [id number]  Lance Coon, MD Inpatient   06/02/2015 1002 06/04/2015 1455 Full Code [id number]  Gladstone Lighter, MD Inpatient     Disposition Plan:  once I can get him off oxygen he can potentially go home.  Antibiotics:  Vancomycin  Rocephin  Tamiflu finished  Time spent: 28 minutes  [facility]           Patient ID: Maurice Davidson, male   DOB: 07/09/1985, 33 y.o.   MRN: [id number]

## 2017-12-30 DIAGNOSIS — F4323 Adjustment disorder with mixed anxiety and depressed mood: Secondary | ICD-10-CM

## 2017-12-30 LAB — BASIC METABOLIC PANEL
Anion gap: 6 (ref 5–15)
BUN: 27 mg/dL — AB (ref 6–20)
CO2: 29 mmol/L (ref 22–32)
Calcium: 8 mg/dL — ABNORMAL LOW (ref 8.9–10.3)
Chloride: 98 mmol/L — ABNORMAL LOW (ref 101–111)
Creatinine, Ser: 1.53 mg/dL — ABNORMAL HIGH (ref 0.61–1.24)
GFR calc Af Amer: >60 mL/min (ref >60)
GFR calc non Af Amer: 58 mL/min — ABNORMAL LOW (ref >60)
GLUCOSE: 260 mg/dL — AB (ref 65–99)
POTASSIUM: 4.8 mmol/L (ref 3.5–5.1)
Sodium: 133 mmol/L — ABNORMAL LOW (ref 135–145)

## 2017-12-30 LAB — GLUCOSE, CAPILLARY
GLUCOSE-CAPILLARY: 144 mg/dL — AB (ref 65–99)
GLUCOSE-CAPILLARY: 265 mg/dL — AB (ref 65–99)
Glucose-Capillary: 275 mg/dL — ABNORMAL HIGH (ref 65–99)
Glucose-Capillary: 220 mg/dL — ABNORMAL HIGH (ref 65–99)
Glucose-Capillary: 294 mg/dL — ABNORMAL HIGH (ref 65–99)
Glucose-Capillary: 326 mg/dL — ABNORMAL HIGH (ref 65–99)

## 2017-12-30 LAB — CBC
HEMATOCRIT: 22 % — AB (ref 40.0–52.0)
Hemoglobin: 7.1 g/dL — ABNORMAL LOW (ref 13.0–18.0)
MCH: 29.4 pg (ref 26.0–34.0)
MCHC: 32.3 g/dL (ref 32.0–36.0)
MCV: 91 fL (ref 80.0–100.0)
Platelets: 227 10*3/uL (ref 150–440)
RBC: 2.42 MIL/uL — ABNORMAL LOW (ref 4.40–5.90)
RDW: 13.8 % (ref 11.5–14.5)
WBC: 11.8 10*3/uL — ABNORMAL HIGH (ref 3.8–10.6)

## 2017-12-30 LAB — PREPARE RBC (CROSSMATCH)

## 2017-12-30 LAB — FERRITIN: Ferritin: 144 ng/mL (ref 24–336)

## 2017-12-30 MED ORDER — LORAZEPAM 0.5 MG PO TABS
0.5000 mg | ORAL_TABLET | Freq: Three times a day (TID) | ORAL | Status: DC | PRN
  Administered 2017-12-30 – 2017-12-31 (×2): 0.5 mg via ORAL
  Filled 2017-12-30 (×2): qty 1

## 2017-12-30 MED ORDER — INSULIN GLARGINE 100 UNIT/ML ~~LOC~~ SOLN
8.0000 [IU] | Freq: Every day | SUBCUTANEOUS | Status: DC
  Administered 2017-12-30: 8 [IU] via SUBCUTANEOUS
  Filled 2017-12-30 (×3): qty 0.08

## 2017-12-30 MED ORDER — PAROXETINE HCL 20 MG PO TABS
20.0000 mg | ORAL_TABLET | Freq: Every day | ORAL | Status: DC
  Administered 2017-12-30 – 2017-12-31 (×2): 20 mg via ORAL
  Filled 2017-12-30: qty 1

## 2017-12-30 MED ORDER — TRAZODONE HCL 50 MG PO TABS: 150.0000 mg | ORAL_TABLET | Freq: Every day | ORAL | Status: DC

## 2017-12-30 MED ORDER — SODIUM CHLORIDE 0.9 % IV SOLN
Freq: Once | INTRAVENOUS | Status: AC
  Administered 2017-12-30: 11:00:00 via INTRAVENOUS

## 2017-12-30 NOTE — Progress Notes (Signed)
SATURATION QUALIFICATIONS: (This note is used to comply with regulatory documentation for home oxygen)  Patient Saturations on Room Air at Rest = 92%  Patient Saturations on Room Air while Ambulating = 86%  Patient Saturations on 2 Liters of oxygen while Ambulating = 92%  Please briefly explain why patient needs home oxygen: Patient was able to get up to side of the bed and just getting to the edge of the bed O2 sats dropped to 86% on room air.

## 2017-12-30 NOTE — Consult Note (Signed)
[facility] Face-to-Face Psychiatry Consult   Reason for Consult: This is a consult for this 32-year-old man with a history of substance abuse and mood symptoms.  Concern about anxiety. Referring Physician: Wieting Patient Identification: Maurice Davidson MRN:  [id number] Principal Diagnosis: Adjustment disorder with mixed anxiety and depressed mood Diagnosis:   Patient Active Problem List   Diagnosis Date Noted  . Adjustment disorder with mixed anxiety and depressed mood [F43.23] 12/30/2017  . Aspiration pneumonia (HCC) [J69.0]   . Severe sepsis (HCC) [A41.9, R65.20] 12/22/2017  . DKA (diabetic ketoacidoses) (HCC) [E13.10] 12/22/2017  . Multifocal pneumonia [J18.9] 12/22/2017  . Acute renal failure superimposed on stage 3 chronic kidney disease (HCC) [N17.9, N18.3] 12/22/2017  . Depression, recurrent (HCC) [F33.9] 06/25/2017  . HTN (hypertension) [I10] 11/27/2015  . GI bleed [K92.2] 11/27/2015  . Hypokalemia [E87.6] 11/27/2015  . Protein-calorie malnutrition, severe (HCC) [E43] 06/03/2015  . History of sepsis [Z86.19] 06/02/2015  . Stomach pain [R10.9] 10/20/2013  . Thrombocytosis (HCC) [D47.3] 10/10/2013  . Anxiety [F41.9] 10/10/2013  . Encephalopathy [G93.40] 10/07/2013  . Diabetes mellitus type 1 (HCC) [E10.9] 10/07/2013  . MRSA bacteremia [R78.81] 10/07/2013  . Seizures (HCC) [R56.9] 10/07/2013  . History of aspiration pneumonia [Z87.01] 10/07/2013  . Anemia of chronic disease [D63.8] 10/07/2013    Total Time spent with patient: 1 hour  Subjective:   Maurice Davidson is a 32 y.o. male patient admitted with "I cannot get my oxygen level up".  HPI: Patient seen chart reviewed.  This is a 32-year-old man with a long-standing history of multiple medical problems and also a history of mood and anxiety symptoms and substance abuse problems.  Has been in the hospital now recovering from a pneumonia.  Patient is complaining of anxiety and depression.  During my conversation with him his  specific complaints wavered from one moment to another but all of them seem to come down to wanting to have more Ativan or Xanax prescribed.  Patient says his mood feels depressed.  He claims that he is not able to sleep at night.  He also reports that he is anxious.  Does not report any suicidal thought does not report any psychotic symptoms.  Patient is explicitly asking for benzodiazepine treatment.  He tells me that the Seroquel which he was taking before he came into the hospital made him feel bad and that he thought it made him have "seizures".  He does not want to take that.  He also tells me that Paxil worked better than Zoloft and wants to change that medicine.  Patient has been taking his Suboxone but tells me that he would rather be off of it.  I think that would be very poor judgment on his part and would not recommend any change to the Suboxone.  Social history: Lives with his mother and other extended family.  Not able to work outside the home.  Medical history: Patient has diabetes and has had repeated episodes of pneumonia.  Has had several presentations for diabetic ketoacidosis.  Patient is shockingly underweight but apparently that has been a chronic situation.  Substance abuse history: Patient has a documented history of opiate abuse.  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  He has been seen by psychiatric consult services in the past.  Diagnosis in every note that I can   identify has been anxiety and substance abuse.  Patient is insistent that he was recently diagnosed with bipolar depression.  Based on the history I think that diagnosis is certainly questionable.  In any case he was started on Seroquel which is an appropriate treatment for bipolar depression but now he is asking  to be off of it.  Pretty well documented in old notes that the patient has a tendency to fixate on benzodiazepines as treatment  Risk to Self: Is patient at risk for suicide?: No Risk to Others:   Prior Inpatient Therapy:   Prior Outpatient Therapy:    Past Medical History:  Past Medical History:  Diagnosis Date  . Anemia   . Anxiety   . Aspiration pneumonia (HCC)   . Chronic pain    take Suboxone  . Diabetes mellitus without complication (HCC)   . Drug-seeking behavior   . Encephalopathy   . Gastroparesis   . H/O: GI bleed   . HTN (hypertension)   . MRSA (methicillin resistant staph aureus) culture positive   . Opioid dependence (HCC)   . Renal disorder   . Seizures (HCC)   . Sepsis (HCC)     Past Surgical History:  Procedure Laterality Date  . ORTHOPEDIC SURGERY     Family History:  Family History  Problem Relation Age of Onset  . Clotting disorder Sister        Von Willebrand's disease plus polycythemia vera  . Hypertension Mother    Family Psychiatric  History: Positive history of substance use Social History:  Social History   Substance and Sexual Activity  Alcohol Use No  . Alcohol/week: 0.0 oz     Social History   Substance and Sexual Activity  Drug Use Yes   Comment: marijuana, cocaine    Social History   Socioeconomic History  . Marital status: Single    Spouse name: Not on file  . Number of children: Not on file  . Years of education: Not on file  . Highest education level: Not on file  Occupational History  . Not on file  Social Needs  . Financial resource strain: Not on file  . Food insecurity:    Worry: Not on file    Inability: Not on file  . Transportation needs:    Medical: Not on file    Non-medical: Not on file  Tobacco Use  . Smoking status: Current Some Day Smoker    Packs/day: 1.00    Types: Cigarettes  . Smokeless tobacco: Never Used  Substance and Sexual Activity  . Alcohol use: No    Alcohol/week: 0.0 oz  . Drug  use: Yes    Comment: marijuana, cocaine  . Sexual activity: Not on file  Lifestyle  . Physical activity:    Days per week: Not on file    Minutes per session: Not on file  . Stress: Not on file  Relationships  . Social connections:    Talks on phone: Not on file    Gets together: Not on file    Attends religious service: Not on file    Active member of club or organization: Not on file    Attends meetings of clubs or organizations: Not on file    Relationship status: Not on file  Other Topics Concern  . Not on file  Social History Narrative   Lives at home with parents.   Additional Social History:    Allergies:   Allergies  Allergen Reactions  . Benzodiazepines       Avoid prescribing. Not a true allergy  . Abilify [Aripiprazole] Rash    Labs:  Results for orders placed or performed during the hospital encounter of 12/22/17 (from the past 48 hour(s))  Glucose, capillary     Status: Abnormal   Collection Time: 12/28/17  9:06 PM  Result Value Ref Range   Glucose-Capillary 139 (H) 65 - 99 mg/dL  Glucose, capillary     Status: Abnormal   Collection Time: 12/29/17 12:05 AM  Result Value Ref Range   Glucose-Capillary 134 (H) 65 - 99 mg/dL  Glucose, capillary     Status: Abnormal   Collection Time: 12/29/17  3:47 AM  Result Value Ref Range   Glucose-Capillary 183 (H) 65 - 99 mg/dL  Basic metabolic panel     Status: Abnormal   Collection Time: 12/29/17  3:57 AM  Result Value Ref Range   Sodium 135 135 - 145 mmol/L   Potassium 4.0 3.5 - 5.1 mmol/L   Chloride 99 (L) 101 - 111 mmol/L   CO2 29 22 - 32 mmol/L   Glucose, Bld 198 (H) 65 - 99 mg/dL   BUN 28 (H) 6 - 20 mg/dL   Creatinine, Ser 1.29 (H) 0.61 - 1.24 mg/dL   Calcium 7.7 (L) 8.9 - 10.3 mg/dL   GFR calc non Af Amer >60 >60 mL/min   GFR calc Af Amer >60 >60 mL/min    Comment: (NOTE) The eGFR has been calculated using the CKD EPI equation. This calculation has not been validated in all clinical situations. eGFR's  persistently <60 mL/min signify possible Chronic Kidney Disease.    Anion gap 7 5 - 15    Comment: Performed at [facility], [address] [place], [place] [address]  CBC     Status: Abnormal   Collection Time: 12/29/17  3:57 AM  Result Value Ref Range   WBC 9.7 3.8 - 10.6 K/uL   RBC 2.40 (L) 4.40 - 5.90 MIL/uL   Hemoglobin 7.2 (L) 13.0 - 18.0 g/dL   HCT 21.8 (L) 40.0 - 52.0 %   MCV 90.9 80.0 - 100.0 fL   MCH 30.0 26.0 - 34.0 pg   MCHC 33.0 32.0 - 36.0 g/dL   RDW 13.7 11.5 - 14.5 %   Platelets 157 150 - 440 K/uL    Comment: Performed at [facility], [address] [place], [place] [address]  Glucose, capillary     Status: Abnormal   Collection Time: 12/29/17  7:54 AM  Result Value Ref Range   Glucose-Capillary 127 (H) 65 - 99 mg/dL   Comment 1 Notify RN   Glucose, capillary     Status: Abnormal   Collection Time: 12/29/17  9:46 AM  Result Value Ref Range   Glucose-Capillary 19 (LL) 65 - 99 mg/dL   Comment 1 Notify RN   Glucose, capillary     Status: Abnormal   Collection Time: 12/29/17 10:08 AM  Result Value Ref Range   Glucose-Capillary <10 (LL) 65 - 99 mg/dL   Comment 1 Notify RN   Glucose, capillary     Status: Abnormal   Collection Time: 12/29/17 10:25 AM  Result Value Ref Range   Glucose-Capillary 163 (H) 65 - 99 mg/dL   Comment 1 Notify RN   Glucose, capillary     Status: Abnormal   Collection Time: 12/29/17 11:47 AM  Result Value Ref Range   Glucose-Capillary 145 (H) 65 - 99 mg/dL   Comment 1 Notify RN   Glucose, capillary       Status: Abnormal   Collection Time: 12/29/17  4:25 PM  Result Value Ref Range   Glucose-Capillary 372 (H) 65 - 99 mg/dL  Glucose, capillary     Status: Abnormal   Collection Time: 12/29/17  7:55 PM  Result Value Ref Range   Glucose-Capillary 256 (H) 65 - 99 mg/dL   Comment 1 Notify RN   Glucose, capillary     Status: Abnormal   Collection Time: 12/29/17  9:07 PM  Result Value Ref Range   Glucose-Capillary  256 (H) 65 - 99 mg/dL  Glucose, capillary     Status: Abnormal   Collection Time: 12/30/17  5:58 AM  Result Value Ref Range   Glucose-Capillary 275 (H) 65 - 99 mg/dL   Comment 1 Notify RN   CBC     Status: Abnormal   Collection Time: 12/30/17  6:18 AM  Result Value Ref Range   WBC 11.8 (H) 3.8 - 10.6 K/uL   RBC 2.42 (L) 4.40 - 5.90 MIL/uL   Hemoglobin 7.1 (L) 13.0 - 18.0 g/dL   HCT 22.0 (L) 40.0 - 52.0 %   MCV 91.0 80.0 - 100.0 fL   MCH 29.4 26.0 - 34.0 pg   MCHC 32.3 32.0 - 36.0 g/dL   RDW 13.8 11.5 - 14.5 %   Platelets 227 150 - 440 K/uL    Comment: Performed at [facility], [address] [place], [place] [address]  Basic metabolic panel     Status: Abnormal   Collection Time: 12/30/17  6:18 AM  Result Value Ref Range   Sodium 133 (L) 135 - 145 mmol/L   Potassium 4.8 3.5 - 5.1 mmol/L   Chloride 98 (L) 101 - 111 mmol/L   CO2 29 22 - 32 mmol/L   Glucose, Bld 260 (H) 65 - 99 mg/dL   BUN 27 (H) 6 - 20 mg/dL   Creatinine, Ser 1.53 (H) 0.61 - 1.24 mg/dL   Calcium 8.0 (L) 8.9 - 10.3 mg/dL   GFR calc non Af Amer 58 (L) >60 mL/min   GFR calc Af Amer >60 >60 mL/min    Comment: (NOTE) The eGFR has been calculated using the CKD EPI equation. This calculation has not been validated in all clinical situations. eGFR's persistently <60 mL/min signify possible Chronic Kidney Disease.    Anion gap 6 5 - 15    Comment: Performed at [facility], [address] [place], [place] [address]  Ferritin     Status: None   Collection Time: 12/30/17  6:18 AM  Result Value Ref Range   Ferritin 144 24 - 336 ng/mL    Comment: Performed at [facility], [address] [place], [place] [address]  Glucose, capillary     Status: Abnormal   Collection Time: 12/30/17  8:19 AM  Result Value Ref Range   Glucose-Capillary 220 (H) 65 - 99 mg/dL   Comment 1 Notify RN   Type and screen [place]     Status: None (Preliminary result)   Collection  Time: 12/30/17  8:35 AM  Result Value Ref Range   ABO/RH(D) O POS    Antibody Screen NEG    Sample Expiration 01/02/2018    Unit Number [id number]    Blood Component Type RBC, LR IRR    Unit division 00    Status of Unit ISSUED    Transfusion Status OK TO TRANSFUSE    Crossmatch Result      Compatible Performed at [facility], [place], [place] [address]   Prepare RBC     Status: None   Collection Time: 12/30/17 10:16 AM  Result Value Ref Range   Order Confirmation      ORDER PROCESSED BY BLOOD BANK Performed at [facility], [address] [place],  [address]   Glucose, capillary     Status: Abnormal   Collection Time: 12/30/17 12:20 PM  Result Value Ref Range   Glucose-Capillary 144 (H) 65 - 99 mg/dL   Comment 1 Notify RN   Glucose, capillary     Status: Abnormal   Collection Time: 12/30/17  2:00 PM  Result Value Ref Range   Glucose-Capillary 294 (H) 65 - 99 mg/dL    Current Facility-Administered Medications  Medication Dose Route Frequency Provider Last Rate Last Dose  . acetaminophen (TYLENOL) tablet 650 mg  650 mg Oral Q4H PRN Lance Coon, MD   650 mg at 12/28/17 1058  . aspirin EC tablet 81 mg  81 mg Oral Daily Lannie Flock, MD   81 mg at 12/30/17 0844  . budesonide (PULMICORT) nebulizer solution 0.25 mg  0.25 mg Nebulization BID Wilhelmina Mcardle, MD   0.25 mg at 12/30/17 0820  . buprenorphine-naloxone (SUBOXONE) 8-2 mg per SL tablet 1 tablet  1 tablet Sublingual Daily Loletha Grayer, MD   1 tablet at 12/30/17 0843  . cefTRIAXone (ROCEPHIN) 2 g in sodium chloride 0.9 % 100 mL IVPB  2 g Intravenous Q12H Wilhelmina Mcardle, MD   Stopped at 12/30/17 0915  . chlorhexidine gluconate (MEDLINE KIT) (PERIDEX) 0.12 % solution 15 mL  15 mL Mouth Rinse BID Flora Lipps, MD   15 mL at 12/30/17 1120  . dextrose 50 % solution 25 g  25 g Intravenous PRN Awilda Bill, NP   25 g at 12/29/17 1010  . diclofenac sodium (VOLTAREN) 1 %  transdermal gel 2 g  2 g Topical Q6H PRN Wieting, Richard, MD      . diphenoxylate-atropine (LOMOTIL) 2.5-0.025 MG per tablet 2 tablet  2 tablet Oral QID PRN Lafayette Dragon, MD   2 tablet at 12/28/17 1248  . famotidine (PEPCID) tablet 20 mg  20 mg Oral Daily Loletha Grayer, MD   20 mg at 12/30/17 0844  . gabapentin (NEURONTIN) capsule 800 mg  800 mg Oral TID Candelaria Stagers, RPH   800 mg at 12/30/17 0843  . heparin injection 5,000 Units  5,000 Units Subcutaneous Driscilla Moats, MD   5,000 Units at 12/30/17 0845  . insulin aspart (novoLOG) injection 1-5 Units  1-5 Units Subcutaneous TID AC & HS Loletha Grayer, MD   2 Units at 12/30/17 0845  . insulin glargine (LANTUS) injection 8 Units  8 Units Subcutaneous QHS Wieting, Richard, MD      . ipratropium-albuterol (DUONEB) 0.5-2.5 (3) MG/3ML nebulizer solution 3 mL  3 mL Nebulization Q6H Wilhelmina Mcardle, MD   3 mL at 12/30/17 1404  . LORazepam (ATIVAN) tablet 0.5 mg  0.5 mg Oral Q8H PRN Loletha Grayer, MD      . multivitamin with minerals tablet 1 tablet  1 tablet Oral Daily Loletha Grayer, MD   1 tablet at 12/30/17 0844  . nystatin (MYCOSTATIN) 100000 UNIT/ML suspension 500,000 Units  5 mL Oral QID Loletha Grayer, MD   500,000 Units at 12/30/17 [id number]  . ondansetron (ZOFRAN) injection 4 mg  4 mg Intravenous Q6H PRN Lance Coon, MD      . protein supplement (PREMIER PROTEIN)  liquid  2 oz Oral TID WC Richards, Karol A, MD   2 oz at 12/30/17 1258  . senna-docusate (Senokot-S) tablet 1 tablet  1 tablet Oral BID Wieting, Richard, MD   1 tablet at 12/30/17 0843  . sertraline (ZOLOFT) tablet 100 mg  100 mg Oral Daily Simonds, David B, MD   100 mg at 12/30/17 0844  . sodium chloride flush (NS) 0.9 % injection 10-40 mL  10-40 mL Intracatheter Q12H Patel, Shreyang, MD   10 mL at 12/30/17 0846  . sodium chloride flush (NS) 0.9 % injection 10-40 mL  10-40 mL Intracatheter PRN Patel, Shreyang, MD      . traZODone (DESYREL) tablet 150 mg  150 mg  Oral QHS Wieting, Richard, MD        Musculoskeletal: Strength & Muscle Tone: decreased Gait & Station: normal Patient leans: N/A  Psychiatric Specialty Exam: Physical Exam  Nursing note and vitals reviewed. Constitutional: He appears well-developed.    HENT:  Head: Normocephalic and atraumatic.  Eyes: Pupils are equal, round, and reactive to light. Conjunctivae are normal.  Neck: Normal range of motion.  Cardiovascular: Regular rhythm and normal heart sounds.  Respiratory: Effort normal.  GI: Soft.  Musculoskeletal: Normal range of motion.  Neurological: He is alert.  Skin: Skin is warm and dry.  Psychiatric: His speech is normal and behavior is normal. Thought content normal. His mood appears anxious. Cognition and memory are normal. He expresses inappropriate judgment.    Review of Systems  Constitutional: Positive for malaise/fatigue.  HENT: Negative.   Eyes: Negative.   Respiratory: Positive for shortness of breath.   Cardiovascular: Negative.   Gastrointestinal: Negative.   Musculoskeletal: Negative.   Skin: Negative.   Neurological: Negative.   Psychiatric/Behavioral: Positive for depression. Negative for hallucinations, memory loss, substance abuse and suicidal ideas. The patient is nervous/anxious and has insomnia.     Blood pressure 117/67, pulse 99, temperature 98.5 F (36.9 C), temperature source Oral, resp. rate 18, height 5' 9" (1.753 m), weight 44.7 kg (98 lb 9.6 oz), SpO2 95 %.Body mass index is 14.56 kg/m.  General Appearance: Disheveled  Eye Contact:  Good  Speech:  Clear and Coherent  Volume:  Normal  Mood:  Anxious  Affect:  Congruent  Thought Process:  Goal Directed  Orientation:  Full (Time, Place, and Person)  Thought Content:  Logical  Suicidal Thoughts:  No  Homicidal Thoughts:  No  Memory:  Immediate;   Fair Recent;   Fair Remote;   Fair  Judgement:  Impaired  Insight:  Shallow  Psychomotor Activity:  Decreased  Concentration:   Concentration: Fair  Recall:  Fair  Fund of Knowledge:  Fair  Language:  Fair  Akathisia:  No  Handed:  Right  AIMS (if indicated):     Assets:  Communication Skills Desire for Improvement Housing Social Support  ADL's:  Intact  Cognition:  WNL  Sleep:        Treatment Plan Summary: Daily contact with patient to assess and evaluate symptoms and progress in treatment, Medication management and Plan 32-year-old man who has a history of chronic anxiety and mild depression.  He is specifically asking to have more benzodiazepines.  Patient has a documented history of substance abuse and does not have any documented indication for treatment with benzodiazepines.  He told me that he had "always" been treated with Ativan.  Checking the controlled substance database this is clearly not true there is no evidence that he has been prescribed   any benzodiazepines the last couple years.  Patient is stating that he wants to not take the Seroquel.  I educated him that this is actually probably 1 of the best and safest medications for treatment of bipolar depression but the patient still does not want to take it.  He does want to change his Zoloft to Paxil which seems like a reasonable thing to do and fairly risk-free.  I note that he has an order for as needed Ativan.  If this helps to keep the patient calm enough that he will comply with appropriate medical treatment so that he can get well there is probably little harm in it but I would not increase the dose of benzodiazepines and I would not recommend giving him a prescription at discharge.  I would recommend continuing him on the Suboxone at the current dose.  Disposition: No evidence of imminent risk to self or others at present.   Patient does not meet criteria for psychiatric inpatient admission. Supportive therapy provided about ongoing stressors. Discussed crisis plan, support from social network, calling 911, coming to the Emergency Department, and  calling Suicide Hotline.  Alethia Berthold, MD 12/30/2017 4:39 PM

## 2017-12-30 NOTE — Progress Notes (Signed)
Went into room to complete the blood transfusion, Patient stated he felt funny. BS 294, BP 117/67, 100,  O2 sats were difficult to read patients fingers, went from 27 to 88 to 67% on RA.  Placed probe on toes and reading was 99% on 2L, but also getting a breathing treatment.  Will check after breathing treatment is completed.

## 2017-12-30 NOTE — Progress Notes (Signed)
[place] INFECTIOUS DISEASE PROGRESS NOTE Date of Admission:  12/22/2017     ID: Maurice Davidson is a 32 y.o. male with Strep PNA bacteremia and Multifocal PNA Principal Problem:   Severe sepsis ([facility]) Active Problems:   Seizures ([place])   DKA (diabetic ketoacidoses) (HCC)   Multifocal pneumonia   Acute renal failure superimposed on stage 3 chronic kidney disease (HCC)   Aspiration pneumonia (HCC)   Subjective: No fevers, still with cough but much better. Sugars a little more stable  ROS  Eleven systems are reviewed and negative except per hpi  Medications:  Antibiotics Given (last 72 hours)    Date/Time Action Medication Dose Rate   12/27/17 2143 New Bag/Given   cefTRIAXone (ROCEPHIN) 2 g in sodium chloride 0.9 % 100 mL IVPB 2 g 200 mL/hr   12/28/17 0103 New Bag/Given   vancomycin (VANCOCIN) IVPB 750 mg/150 ml premix 750 mg 150 mL/hr   12/28/17 1012 New Bag/Given   cefTRIAXone (ROCEPHIN) 2 g in sodium chloride 0.9 % 100 mL IVPB 2 g 200 mL/hr   12/28/17 2119 New Bag/Given   cefTRIAXone (ROCEPHIN) 2 g in sodium chloride 0.9 % 100 mL IVPB 2 g 200 mL/hr   12/29/17 0006 New Bag/Given   vancomycin (VANCOCIN) IVPB 750 mg/150 ml premix 750 mg 150 mL/hr   12/29/17 0809 New Bag/Given   cefTRIAXone (ROCEPHIN) 2 g in sodium chloride 0.9 % 100 mL IVPB 2 g 200 mL/hr   12/29/17 2203 New Bag/Given   cefTRIAXone (ROCEPHIN) 2 g in sodium chloride 0.9 % 100 mL IVPB 2 g 200 mL/hr   12/30/17 0845 New Bag/Given   cefTRIAXone (ROCEPHIN) 2 g in sodium chloride 0.9 % 100 mL IVPB 2 g 200 mL/hr     . aspirin EC  81 mg Oral Daily  . budesonide (PULMICORT) nebulizer solution  0.25 mg Nebulization BID  . buprenorphine-naloxone  1 tablet Sublingual Daily  . chlorhexidine gluconate (MEDLINE KIT)  15 mL Mouth Rinse BID  . famotidine  20 mg Oral Daily  . gabapentin  800 mg Oral TID  . heparin  5,000 Units Subcutaneous Q8H  . insulin aspart  1-5 Units Subcutaneous TID AC & HS  . insulin glargine  8  Units Subcutaneous QHS  . ipratropium-albuterol  3 mL Nebulization Q6H  . multivitamin with minerals  1 tablet Oral Daily  . nystatin  5 mL Oral QID  . protein supplement shake  2 oz Oral TID WC  . senna-docusate  1 tablet Oral BID  . sertraline  100 mg Oral Daily  . sodium chloride flush  10-40 mL Intracatheter Q12H  . traZODone  150 mg Oral QHS    Objective: Vital signs in last 24 hours: Temp:  [97.6 F (36.4 C)-98.8 F (37.1 C)] 98.8 F (37.1 C) (04/16 1122) Pulse Rate:  [94-105] 102 (04/16 1122) Resp:  [18] 18 (04/16 1104) BP: (102-117)/(63-73) 102/70 (04/16 1122) SpO2:  [92 %-99 %] 92 % (04/16 1122) Weight:  [44.7 kg (98 lb 9.6 oz)] 44.7 kg (98 lb 9.6 oz) (04/16 0500) Physical Exam  Constitutional: He is oriented to person, place, and time.thin, up and walking around, using bathroom HENT: anicteric Mouth/Throat: Oropharynx is clear and moist. Poor dentition  No oropharyngeal exudate.  Cardiovascular: Normal rate, regular rhythm and normal heart sounds. Exam reveals no gallop and no friction rub.  No murmur heard.  Pulmonary/Chest: mod rhonchi bil, mild wheeze  Abdominal: Soft. Bowel sounds are normal. He exhibits no distension. There is  no tenderness.  Lymphadenopathy: He has no cervical adenopathy.  Neurological: He is alert and oriented to person, place, and time.  Skin: Skin is warm and dry. No rash noted. No erythema.  Psychiatric: He has a normal mood and affect. His behavior is normal.   Lab Results Recent Labs    12/29/17 0357 12/30/17 0618  WBC 9.7 11.8*  HGB 7.2* 7.1*  HCT 21.8* 22.0*  NA 135 133*  K 4.0 4.8  CL 99* 98*  CO2 29 29  BUN 28* 27*  CREATININE 1.29* 1.53*    Microbiology: Results for orders placed or performed during the hospital encounter of 12/22/17  Blood Culture (routine x 2)     Status: Abnormal   Collection Time: 12/22/17  9:31 PM  Result Value Ref Range Status   Specimen Description   Final    BLOOD RIGHT ANTECUBITAL Performed  at [facility], [address] [place], [place] [address]    Special Requests   Final    BOTTLES DRAWN AEROBIC AND ANAEROBIC Blood Culture results may not be optimal due to an excessive volume of blood received in culture bottles Performed at [facility], [address] [place], [place] [address]    Culture  Setup Time   Final    Organism ID to follow [place] CRITICAL RESULT CALLED TO, READ BACK BY AND VERIFIED WITH: LISA KLUTTZ AT 4315 12/23/17 [place] Performed at [facility], [address] [place], [place] [address]    Culture (A)  Final    STREPTOCOCCUS PNEUMONIAE SUSCEPTIBILITIES PERFORMED ON PREVIOUS CULTURE WITHIN THE LAST 5 DAYS. Performed at [facility], [place] [address] [place], [place] [address]    Report Status 12/25/2017 FINAL  Final  Blood Culture (routine x 2)     Status: Abnormal   Collection Time: 12/22/17  9:31 PM  Result Value Ref Range Status   Specimen Description   Final    BLOOD BLOOD LEFT FOREARM Performed at [facility], [address], [place], [place] [address]    Special Requests   Final    BOTTLES DRAWN AEROBIC AND ANAEROBIC Blood Culture adequate volume Performed at [facility], [address] [place], [place] [address]    Culture  Setup Time   Final    GRAM POSITIVE COCCI IN BOTH AEROBIC AND ANAEROBIC BOTTLES CRITICAL VALUE NOTED.  VALUE IS CONSISTENT WITH PREVIOUSLY REPORTED AND CALLED VALUE. Performed at [facility], [address] [place], [place] [address]    Culture STREPTOCOCCUS PNEUMONIAE (A)  Final   Report Status 12/25/2017 FINAL  Final   Organism ID, Bacteria STREPTOCOCCUS PNEUMONIAE  Final      Susceptibility   Streptococcus pneumoniae - MIC*    ERYTHROMYCIN >=8 RESISTANT Resistant     LEVOFLOXACIN 1 SENSITIVE Sensitive     PENICILLIN (meningitis) 1 RESISTANT Resistant     PENICILLIN (non-meningitis) 1  SENSITIVE Sensitive     CEFTRIAXONE (non-meningitis) 1 SENSITIVE Sensitive     CEFTRIAXONE (meningitis) 1 INTERMEDIATE Intermediate     * STREPTOCOCCUS PNEUMONIAE  Blood Culture ID Panel (Reflexed)     Status: Abnormal   Collection Time: 12/22/17  9:31 PM  Result Value Ref Range Status   Enterococcus species NOT DETECTED NOT DETECTED Final   Listeria monocytogenes NOT DETECTED NOT DETECTED Final   Staphylococcus species NOT DETECTED NOT DETECTED Final   Staphylococcus aureus NOT DETECTED NOT DETECTED Final   Streptococcus  species DETECTED (A) NOT DETECTED Final    Comment: CRITICAL RESULT CALLED TO, READ BACK BY AND VERIFIED WITH:  LISA KLUTTZ AT 4098 12/23/17 SDR    Streptococcus agalactiae NOT DETECTED NOT DETECTED Final   Streptococcus pneumoniae DETECTED (A) NOT DETECTED Final    Comment: CRITICAL RESULT CALLED TO, READ BACK BY AND VERIFIED WITH:  LISA KLUTTZ AT 1191 12/23/17 SDR    Streptococcus pyogenes NOT DETECTED NOT DETECTED Final   Acinetobacter baumannii NOT DETECTED NOT DETECTED Final   Enterobacteriaceae species NOT DETECTED NOT DETECTED Final   Enterobacter cloacae complex NOT DETECTED NOT DETECTED Final   Escherichia coli NOT DETECTED NOT DETECTED Final   Klebsiella oxytoca NOT DETECTED NOT DETECTED Final   Klebsiella pneumoniae NOT DETECTED NOT DETECTED Final   Proteus species NOT DETECTED NOT DETECTED Final   Serratia marcescens NOT DETECTED NOT DETECTED Final   Haemophilus influenzae NOT DETECTED NOT DETECTED Final   Neisseria meningitidis NOT DETECTED NOT DETECTED Final   Pseudomonas aeruginosa NOT DETECTED NOT DETECTED Final   Candida albicans NOT DETECTED NOT DETECTED Final   Candida glabrata NOT DETECTED NOT DETECTED Final   Candida krusei NOT DETECTED NOT DETECTED Final   Candida parapsilosis NOT DETECTED NOT DETECTED Final   Candida tropicalis NOT DETECTED NOT DETECTED Final    Comment: Performed at [facility], [address] [place], [place]  [address]  MRSA PCR Screening     Status: Abnormal   Collection Time: 12/23/17  8:47 AM  Result Value Ref Range Status   MRSA by PCR POSITIVE (A) NEGATIVE Final    Comment:        The GeneXpert MRSA Assay (FDA approved for NASAL specimens only), is one component of a comprehensive MRSA colonization surveillance program. It is not intended to diagnose MRSA infection nor to guide or monitor treatment for MRSA infections. RESULT CALLED TO, READ BACK BY AND VERIFIED WITH: C/ MYRA FLOWERS '@1020'  12/23/17 [facility] Performed at [facility], [address] [place], [place] [address]   Culture, respiratory (NON-Expectorated)     Status: None   Collection Time: 12/23/17 12:24 PM  Result Value Ref Range Status   Specimen Description   Final    SPUTUM Performed at [facility], [address] [place], [place] [address]    Special Requests   Final    NONE Performed at [facility], [address] [place], [place] [address]    Gram Stain   Final    ABUNDANT WBC PRESENT,BOTH PMN AND MONONUCLEAR FEW GRAM POSITIVE COCCI    Culture   Final    Consistent with normal respiratory flora. Performed at [facility], [place] [address] [place], [place] [address]    Report Status 12/26/2017 FINAL  Final  CULTURE, BLOOD (ROUTINE X 2) w Reflex to ID Panel     Status: None (Preliminary result)   Collection Time: 12/26/17 11:27 AM  Result Value Ref Range Status   Specimen Description BLOOD LT [facility]  Final   Special Requests   Final    BOTTLES DRAWN AEROBIC AND ANAEROBIC Blood Culture adequate volume   Culture   Final    NO GROWTH 4 DAYS Performed at [facility], [address], [place], [place] [address]    Report Status PENDING  Incomplete  CULTURE, BLOOD (ROUTINE X 2) w Reflex to ID Panel     Status: None (Preliminary result)   Collection Time: 12/26/17 11:27 AM  Result Value Ref Range  Status   Specimen Description BLOOD LT [facility]  Final   Special Requests    Final    BOTTLES DRAWN AEROBIC AND ANAEROBIC Blood Culture adequate volume   Culture   Final    NO GROWTH 4 DAYS Performed at [facility], [address] [place], [place] [address]    Report Status PENDING  Incomplete  C difficile quick scan w PCR reflex     Status: None   Collection Time: 12/27/17  8:46 AM  Result Value Ref Range Status   C Diff antigen NEGATIVE NEGATIVE Final   C Diff toxin NEGATIVE NEGATIVE Final   C Diff interpretation No C. difficile detected.  Final    Comment: Performed at [facility], [address] [place], [place] [address]    Studies/Results: No results found.  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 Initially intubated but now on Hi flo O2. Was very lethargic after extubation and there was concern for meningitis, however much more alert today. CT head neg except for extensive sinus disease. PC improving, HIV neg.  He was started on vanco in addition to ctx which was increased from qd to q 12 hours on 4/11 and dexamethasome was added.  His Strep PNA is relatively resistant especially if needed to treat meningitis.   4/15- no fevers, out of unit. Echo neg for veg. Much much more alert but sugars low to 19. 4/16- remains stable - stopped vanco yest. Stopping dexa. Day 9 of abx coverage for Strep PNA.  Recommendations At this point much less likely to have had meningitis. AMS was likely multifactorial. Cont ceftriaxone Q 12 but at dc can send home on either oral levofloxacin 750 qd for total course of 14 days or amoxicillin 1000 mg bid for same duration.  Thank you very much for the consult. Will follow with you.  Leonel Ramsay   12/30/2017, 1:16 PM

## 2017-12-30 NOTE — Progress Notes (Addendum)
Patient ID: Maurice Davidson, male   DOB: 07/09/1985, 32 y.o.   MRN: [id number]  [facility] PROGRESS NOTE  REMIEL CORTI FWY:[id number] DOB: 07/09/1985 DOA: 12/22/2017 PCP: Trinna Post, PA-C  HPI/Subjective: Patient feeling okay.  Does get a little short of breath with moving around.  Pulse ox still dropping down with ambulation.  As per nursing staff was 86% when he just got out of the bed.  Patient requesting to speak with a psychiatrist because of his bipolar disorder and anxiety.  States he was on Seroquel but he does not want to be on this medication.  States he slept well when they gave him Ativan last night.  Objective: Vitals:   12/30/17 1104 12/30/17 1122  BP: 113/73 102/70  Pulse: 100 (!) 102  Resp: 18   Temp: 98 F (36.7 C) 98.8 F (37.1 C)  SpO2: 92% 92%    Filed Weights   12/28/17 0500 12/29/17 0500 12/30/17 0500  Weight: 43.5 kg (96 lb) 44.5 kg (98 lb 1.7 oz) 44.7 kg (98 lb 9.6 oz)    ROS: Review of Systems  Constitutional: Negative for chills and fever.  Eyes: Negative for blurred vision.  Respiratory: Positive for cough and shortness of breath.   Cardiovascular: Negative for chest pain.  Gastrointestinal: Negative for abdominal pain, constipation, diarrhea, nausea and vomiting.  Genitourinary: Negative for dysuria.  Musculoskeletal: Negative for joint pain.  Neurological: Negative for dizziness and headaches.   Exam: Physical Exam  HENT:  Nose: No mucosal edema.  Mouth/Throat: No oropharyngeal exudate or posterior oropharyngeal edema.  Thrush in the mouth.  Eyes: Pupils are equal, round, and reactive to light. Conjunctivae, EOM and lids are normal.  Neck: No JVD present. Carotid bruit is not present. No edema present. No thyroid mass and no thyromegaly present.  Cardiovascular: S1 normal and S2 normal. Exam reveals no gallop.  No murmur heard. Pulses:      Dorsalis pedis pulses are 2+ on the right side, and 2+ on the left side.  Respiratory:  No respiratory distress. He has no wheezes. He has rhonchi in the right lower field and the left lower field. He has no rales.  GI: Soft. Bowel sounds are normal. There is no tenderness.  Musculoskeletal:       Right shoulder: He exhibits no swelling.       Right ankle: He exhibits no swelling.       Left ankle: He exhibits no swelling.  Lymphadenopathy:    He has no cervical adenopathy.  Neurological: He is alert. No cranial nerve deficit.  Skin: Skin is warm. No rash noted. Nails show no clubbing.  Psychiatric: He has a normal mood and affect.      Data Reviewed: Basic Metabolic Panel: Recent Labs  Lab 12/23/17 1355 12/23/17 1653 12/24/17 0511  12/25/17 1830 12/26/17 0501 12/26/17 1959 12/27/17 0425 12/29/17 0357 12/30/17 0618  NA 143 142 140   < > 147* 143 143 139 135 133*  K 3.2* 2.7* 3.6   < > 3.2* 4.1 4.0 3.8 4.0 4.8  CL 107 106 104   < > 108 105 106 101 99* 98*  CO2 _0 < > 33* _1 GLUCOSE 239* 204* 309*   < > 31* 229* 42* 154* 198* 260*  BUN 85* 84* 80*   < > 52* 43* 35* 35* 28* 27*  CREATININE 2.68* 2.68* 2.70*   < > 1.75* 1.59* 1.33* 1.27* 1.29*  1.53*  CALCIUM 7.0* 6.9* 7.1*   < > 7.8* 7.8* 8.6* 8.5* 7.7* 8.0*  MG 2.6* 2.5* 2.6*  --   --  1.3*  --  1.7  --   --   PHOS <1.0* <1.0* 1.4*  --  1.9* 3.2  --  2.2*  --   --    < > = values in this interval not displayed.   Liver Function Tests: Recent Labs  Lab 12/23/17 2108 12/25/17 0645 12/26/17 0501  AST  --  104* 62*  ALT  --  49 49  ALKPHOS  --  323* 342*  BILITOT  --  0.6 0.9  PROT  --  6.1* 6.7  ALBUMIN 1.9* 2.1* 2.3*    Recent Labs  Lab 12/27/17 0425  AMMONIA 12   CBC: Recent Labs  Lab 12/25/17 0645 12/26/17 0501 12/29/17 0357 12/30/17 0618  WBC 14.2* 12.3* 9.7 11.8*  HGB 9.3* 9.0* 7.2* 7.1*  HCT 27.6* 26.6* 21.8* 22.0*  MCV 89.1 89.4 90.9 91.0  PLT 147* 130* 157 227   CBG: Recent Labs  Lab 12/29/17 1955 12/29/17 2107 12/30/17 0558 12/30/17 0819 12/30/17 1220   GLUCAP 256* 256* 275* 220* 144*    Recent Results (from the past 240 hour(s))  Blood Culture (routine x 2)     Status: Abnormal   Collection Time: 12/22/17  9:31 PM  Result Value Ref Range Status   Specimen Description   Final    BLOOD RIGHT ANTECUBITAL Performed at [facility], [address] [place], [place] [address]    Special Requests   Final    BOTTLES DRAWN AEROBIC AND ANAEROBIC Blood Culture results may not be optimal due to an excessive volume of blood received in culture bottles Performed at  [facility], [address] [place], [place] [address]    Culture  Setup Time   Final    Organism ID to follow [place] TO, READ BACK BY AND VERIFIED WITH: LISA KLUTTZ AT 0623 12/23/17 [place] Performed at [facility], [address] [place], [place] [address]    Culture (A)  Final    STREPTOCOCCUS PNEUMONIAE SUSCEPTIBILITIES PERFORMED ON PREVIOUS CULTURE WITHIN THE LAST 5 DAYS. Performed at [facility], [place] [address], [place], [place] [address]    Report Status 12/25/2017 FINAL  Final  Blood Culture (routine x 2)     Status: Abnormal   Collection Time: 12/22/17  9:31 PM  Result Value Ref Range Status   Specimen Description   Final    BLOOD BLOOD LEFT FOREARM Performed at [facility], [address], [place], [place] [address]    Special Requests   Final    BOTTLES DRAWN AEROBIC AND ANAEROBIC Blood Culture adequate volume Performed at [facility], [address], [place], [place] [address]    Culture  Setup Time   Final    GRAM POSITIVE COCCI IN BOTH AEROBIC AND ANAEROBIC BOTTLES CRITICAL VALUE NOTED.  VALUE IS CONSISTENT WITH PREVIOUSLY REPORTED AND CALLED VALUE. Performed at [facility], [address] [place],  [address]    Culture STREPTOCOCCUS PNEUMONIAE (A)  Final   Report Status 12/25/2017 FINAL  Final   Organism  ID, Bacteria STREPTOCOCCUS PNEUMONIAE  Final      Susceptibility   Streptococcus pneumoniae - MIC*    ERYTHROMYCIN >=8 RESISTANT Resistant     LEVOFLOXACIN 1 SENSITIVE Sensitive  PENICILLIN (meningitis) 1 RESISTANT Resistant     PENICILLIN (non-meningitis) 1 SENSITIVE Sensitive     CEFTRIAXONE (non-meningitis) 1 SENSITIVE Sensitive     CEFTRIAXONE (meningitis) 1 INTERMEDIATE Intermediate     * STREPTOCOCCUS PNEUMONIAE  Blood Culture ID Panel (Reflexed)     Status: Abnormal   Collection Time: 12/22/17  9:31 PM  Result Value Ref Range Status   Enterococcus species NOT DETECTED NOT DETECTED Final   Listeria monocytogenes NOT DETECTED NOT DETECTED Final   Staphylococcus species NOT DETECTED NOT DETECTED Final   Staphylococcus aureus NOT DETECTED NOT DETECTED Final   Streptococcus species DETECTED (A) NOT DETECTED Final    Comment: CRITICAL RESULT CALLED TO, READ BACK BY AND VERIFIED WITH:  LISA KLUTTZ AT 0037 12/23/17 SDR    Streptococcus agalactiae NOT DETECTED NOT DETECTED Final   Streptococcus pneumoniae DETECTED (A) NOT DETECTED Final    Comment: CRITICAL RESULT CALLED TO, READ BACK BY AND VERIFIED WITH:  LISA KLUTTZ AT 0488 12/23/17 SDR    Streptococcus pyogenes NOT DETECTED NOT DETECTED Final   Acinetobacter baumannii NOT DETECTED NOT DETECTED Final   Enterobacteriaceae species NOT DETECTED NOT DETECTED Final   Enterobacter cloacae complex NOT DETECTED NOT DETECTED Final   Escherichia coli NOT DETECTED NOT DETECTED Final   Klebsiella oxytoca NOT DETECTED NOT DETECTED Final   Klebsiella pneumoniae NOT DETECTED NOT DETECTED Final   Proteus species NOT DETECTED NOT DETECTED Final   Serratia marcescens NOT DETECTED NOT DETECTED Final   Haemophilus influenzae NOT DETECTED NOT DETECTED Final   Neisseria meningitidis NOT DETECTED NOT DETECTED Final   Pseudomonas aeruginosa NOT DETECTED NOT DETECTED Final   Candida albicans NOT DETECTED NOT DETECTED Final   Candida glabrata NOT  DETECTED NOT DETECTED Final   Candida krusei NOT DETECTED NOT DETECTED Final   Candida parapsilosis NOT DETECTED NOT DETECTED Final   Candida tropicalis NOT DETECTED NOT DETECTED Final    Comment: Performed at [facility], [address] [place], [place] [address]  MRSA PCR Screening     Status: Abnormal   Collection Time: 12/23/17  8:47 AM  Result Value Ref Range Status   MRSA by PCR POSITIVE (A) NEGATIVE Final    Comment:        The GeneXpert MRSA Assay (FDA approved for NASAL specimens only), is one component of a comprehensive MRSA colonization surveillance program. It is not intended to diagnose MRSA infection nor to guide or monitor treatment for MRSA infections. RESULT CALLED TO, READ BACK BY AND VERIFIED WITH: C/ MYRA FLOWERS _0  12/23/17 [facility] Performed at [facility], [address] [place], [place] [address]   Culture, respiratory (NON-Expectorated)     Status: None   Collection Time: 12/23/17 12:24 PM  Result Value Ref Range Status   Specimen Description   Final    SPUTUM Performed at [facility], [address] [place], [place] [address]    Special Requests   Final    NONE Performed at [facility], [address] [place], [place] [address]    Gram Stain   Final    ABUNDANT WBC PRESENT,BOTH PMN AND MONONUCLEAR FEW GRAM POSITIVE COCCI    Culture   Final    Consistent with normal respiratory flora. Performed at [facility], [place] [address], [place], [place] [address]    Report Status 12/26/2017 FINAL  Final  CULTURE, BLOOD (ROUTINE X 2) w Reflex to ID Panel     Status: None (Preliminary result)  Collection Time: 12/26/17 11:27 AM  Result Value Ref Range Status   Specimen Description BLOOD LT [facility]  Final   Special Requests   Final    BOTTLES DRAWN AEROBIC AND ANAEROBIC Blood Culture adequate volume   Culture   Final    NO GROWTH 4 DAYS Performed at [facility], [address], [place],  [place] [address]    Report Status PENDING  Incomplete  CULTURE, BLOOD (ROUTINE X 2) w Reflex to ID Panel     Status: None (Preliminary result)   Collection Time: 12/26/17 11:27 AM  Result Value Ref Range Status   Specimen Description BLOOD LT [facility]  Final   Special Requests   Final    BOTTLES DRAWN AEROBIC AND ANAEROBIC Blood Culture adequate volume   Culture   Final    NO GROWTH 4 DAYS Performed at [facility], [address], [place], [place] [address]    Report Status PENDING  Incomplete  C difficile quick scan w PCR reflex     Status: None   Collection Time: 12/27/17  8:46 AM  Result Value Ref Range Status   C Diff antigen NEGATIVE NEGATIVE Final   C Diff toxin NEGATIVE NEGATIVE Final   C Diff interpretation No C. difficile detected.  Final    Comment: Performed at [facility], [address] [place], [place] [address]     Scheduled Meds: . aspirin EC  81 mg Oral Daily  . budesonide (PULMICORT) nebulizer solution  0.25 mg Nebulization BID  . buprenorphine-naloxone  1 tablet Sublingual Daily  . chlorhexidine gluconate (MEDLINE KIT)  15 mL Mouth Rinse BID  . dexamethasone  2 mg Intravenous Q12H  . famotidine  20 mg Oral Daily  . gabapentin  800 mg Oral TID  . heparin  5,000 Units Subcutaneous Q8H  . insulin aspart  1-5 Units Subcutaneous TID AC & HS  . insulin glargine  7 Units Subcutaneous QHS  . ipratropium-albuterol  3 mL Nebulization Q6H  . multivitamin with minerals  1 tablet Oral Daily  . nystatin  5 mL Oral QID  . protein supplement shake  2 oz Oral TID WC  . senna-docusate  1 tablet Oral BID  . sertraline  100 mg Oral Daily  . sodium chloride flush  10-40 mL Intracatheter Q12H   Continuous Infusions: . cefTRIAXone (ROCEPHIN)  IV Stopped (12/30/17 0915)    Assessment/Plan:  1. Severe sepsis with Streptococcus pneumoniae in blood cultures.  Patient also has multifocal pneumonia patient on Rocephin.   2. Acute hypoxic respiratory failure.  Patient   initially on high flow nasal cannula.  Now on regular nasal cannula.  Pulse ox at rest is okay but when he starts walking around his pulse ox does drop into the 80's.  Still will need more time here in the hospital.  He does not have a qualifying diagnosis for oxygen at home.  Discontinue Decadron.  Continue nebulizer treatments. 3. Symptomatic anemia likely from dilutional and being in the hospital for so long.  Transfuse 1 unit of blood today.  Benefits and risks explained. 4. Type 1 diabetes mellitus with labile sugars.  Patient was admitted with diabetic ketoacidosis.   Patient had hypoglycemia yesterday.  On Lantus and custom sliding scale. 5. Influenza A positive.  completed Tamiflu 6. Acute kidney injury on chronic kidney disease.  This has improved a bit with IV fluid hydration.  Creatinine down  up slightly this morning at 1.53. 7. History of seizure in  past.  Currently not on any medications.  He states he has been seeing some fluttering in his eyes.  Case discussed with neurology Dr. Doy Mince and she recommended holding off on seizure medications at this point since he was not on anything prior to coming in. 8. Oral thrush on nystatin swish and swallow.  Code Status:     Code Status Orders  (From admission, onward)        Start     Ordered   12/23/17 0034  Full code  Continuous     12/23/17 0033    Code Status History    Date Active Date Inactive Code Status Order ID Comments User Context   11/28/2015 0059 11/30/2015 1346 Full Code [id number]  Lance Coon, MD Inpatient   06/02/2015 1002 06/04/2015 1455 Full Code [id number]  Gladstone Lighter, MD Inpatient     Disposition Plan: still unable to get off oxygen currently.    Antibiotics:  Rocephin  Tamiflu finished  Time spent: 27 minutes  [place]

## 2017-12-31 LAB — GLUCOSE, CAPILLARY
Glucose-Capillary: 146 mg/dL — ABNORMAL HIGH (ref 65–99)
Glucose-Capillary: 191 mg/dL — ABNORMAL HIGH (ref 65–99)
Glucose-Capillary: 204 mg/dL — ABNORMAL HIGH (ref 65–99)
Glucose-Capillary: 344 mg/dL — ABNORMAL HIGH (ref 65–99)
Glucose-Capillary: 384 mg/dL — ABNORMAL HIGH (ref 65–99)

## 2017-12-31 LAB — BASIC METABOLIC PANEL
Anion gap: 6 (ref 5–15)
BUN: 26 mg/dL — ABNORMAL HIGH (ref 6–20)
CO2: 28 mmol/L (ref 22–32)
Calcium: 8 mg/dL — ABNORMAL LOW (ref 8.9–10.3)
Chloride: 98 mmol/L — ABNORMAL LOW (ref 101–111)
Creatinine, Ser: 1.61 mg/dL — ABNORMAL HIGH (ref 0.61–1.24)
GFR calc Af Amer: >60 mL/min (ref >60)
GFR calc non Af Amer: 55 mL/min — ABNORMAL LOW (ref >60)
Glucose, Bld: 413 mg/dL — ABNORMAL HIGH (ref 65–99)
Potassium: 4.4 mmol/L (ref 3.5–5.1)
Sodium: 132 mmol/L — ABNORMAL LOW (ref 135–145)

## 2017-12-31 LAB — TYPE AND SCREEN
ABO/RH(D): O POS
ANTIBODY SCREEN: NEGATIVE
UNIT DIVISION: 0

## 2017-12-31 LAB — CULTURE, BLOOD (ROUTINE X 2)
CULTURE: NO GROWTH
Culture: NO GROWTH
SPECIAL REQUESTS: ADEQUATE
Special Requests: ADEQUATE

## 2017-12-31 LAB — BPAM RBC
Blood Product Expiration Date: 201904232359
ISSUE DATE / TIME: 201904161100
Unit Type and Rh: 5100

## 2017-12-31 LAB — CBC
HCT: 24.1 % — ABNORMAL LOW (ref 40.0–52.0)
HEMOGLOBIN: 7.9 g/dL — AB (ref 13.0–18.0)
MCH: 28.8 pg (ref 26.0–34.0)
MCHC: 32.7 g/dL (ref 32.0–36.0)
MCV: 88 fL (ref 80.0–100.0)
Platelets: 353 10*3/uL (ref 150–440)
RBC: 2.74 MIL/uL — ABNORMAL LOW (ref 4.40–5.90)
RDW: 18.9 % — ABNORMAL HIGH (ref 11.5–14.5)
WBC: 13.7 10*3/uL — ABNORMAL HIGH (ref 3.8–10.6)

## 2017-12-31 MED ORDER — FERROUS SULFATE 325 (65 FE) MG PO TABS: 325.0000 mg | ORAL_TABLET | Freq: Every day | ORAL | 0 refills | Status: DC

## 2017-12-31 MED ORDER — NYSTATIN 100000 UNIT/ML MT SUSP: 5.0000 mL | Freq: Four times a day (QID) | OROMUCOSAL | 0 refills | Status: DC

## 2017-12-31 MED ORDER — AMOXICILLIN 500 MG PO CAPS: 1000.0000 mg | ORAL_CAPSULE | Freq: Two times a day (BID) | ORAL | Status: DC

## 2017-12-31 MED ORDER — PAROXETINE HCL 20 MG PO TABS: 20.0000 mg | ORAL_TABLET | Freq: Every day | ORAL | 0 refills | Status: DC

## 2017-12-31 MED ORDER — AMOXICILLIN 500 MG PO CAPS
1000.0000 mg | ORAL_CAPSULE | Freq: Two times a day (BID) | ORAL | 0 refills | Status: DC
Start: 2018-01-01 — End: 2018-01-07

## 2017-12-31 MED ORDER — INSULIN GLARGINE 100 UNIT/ML ~~LOC~~ SOLN
10.0000 [IU] | Freq: Every day | SUBCUTANEOUS | Status: DC
  Filled 2017-12-31: qty 0.1

## 2017-12-31 MED ORDER — PREMIER PROTEIN SHAKE: 2.0000 [oz_av] | Freq: Three times a day (TID) | ORAL | 0 refills | Status: DC

## 2017-12-31 MED ORDER — FERROUS SULFATE 325 (65 FE) MG PO TABS: 325.0000 mg | ORAL_TABLET | Freq: Every day | ORAL | Status: DC

## 2017-12-31 MED ORDER — INSULIN ASPART 100 UNIT/ML ~~LOC~~ SOLN: SUBCUTANEOUS | 11 refills | Status: DC

## 2017-12-31 MED ORDER — INSULIN ASPART 100 UNIT/ML ~~LOC~~ SOLN
5.0000 [IU] | Freq: Once | SUBCUTANEOUS | Status: AC
  Administered 2017-12-31: 5 [IU] via SUBCUTANEOUS

## 2017-12-31 MED ORDER — LORAZEPAM 0.5 MG PO TABS: 0.5000 mg | ORAL_TABLET | Freq: Three times a day (TID) | ORAL | 0 refills | Status: DC | PRN

## 2017-12-31 MED ORDER — INSULIN ASPART 100 UNIT/ML ~~LOC~~ SOLN: 2.0000 [IU] | Freq: Three times a day (TID) | SUBCUTANEOUS | Status: DC

## 2017-12-31 MED ORDER — GABAPENTIN 400 MG PO CAPS: 800.0000 mg | ORAL_CAPSULE | Freq: Three times a day (TID) | ORAL | 0 refills | Status: DC

## 2017-12-31 NOTE — Discharge Summary (Signed)
[facility] NAME: Maurice Davidson    MR#:  [id number]  DATE OF BIRTH:  07/09/1985  DATE OF ADMISSION:  12/22/2017 ADMITTING PHYSICIAN: Lance Coon, MD  DATE OF DISCHARGE: 12/31/2017 12:47 PM  PRIMARY CARE PHYSICIAN: Trinna Post, PA-C    ADMISSION DIAGNOSIS:  Dehydration [E86.0] Acute kidney injury ([place]) [N17.9] Severe sepsis ([place]) [A41.9, R65.20] Diabetic ketoacidosis without coma associated with type 1 diabetes mellitus ([place]) [E10.10] Multifocal pneumonia [J18.9]  DISCHARGE DIAGNOSIS:  Principal Problem:   Adjustment disorder with mixed anxiety and depressed mood Active Problems:   Seizures (HCC)   Severe sepsis (HCC)   DKA (diabetic ketoacidoses) (HCC)   Multifocal pneumonia   Acute renal failure superimposed on stage 3 chronic kidney disease (HCC)   Aspiration pneumonia ([place])   SECONDARY DIAGNOSIS:   Past Medical History:  Diagnosis Date  . Anemia   . Anxiety   . Aspiration pneumonia ([place])   . Chronic pain    take Suboxone  . Diabetes mellitus without complication ([place])   . Drug-seeking behavior   . Encephalopathy   . Gastroparesis   . H/O: GI bleed   . HTN (hypertension)   . MRSA (methicillin resistant staph aureus) culture positive   . Opioid dependence ([place])   . Renal disorder   . Seizures ([place])   . Sepsis ([place])     HOSPITAL COURSE:   1.  Severe sepsis with Streptococcus pneumonia and blood cultures.  Patient had multifocal pneumonia.  The patient was on Rocephin and vancomycin throughout most of the hospital course.  Switched over to amoxicillin to complete a 14-day course.  Only needs a few more days of antibiotics since the patient was admitted on 12/22/2017. 2.  Acute hypoxic respiratory failure.  The patient initially was on high flow nasal cannula.  The patient was titrated to regular nasal cannula.  The patient for a few days had problems with desaturations with ambulating.  Today was the first  day that he held his saturation in the 90s with ambulating.  I gave nebulizer treatments here while in the hospital. 3.  Symptomatic anemia I did give the patient a blood transfusion.  Hemoglobin after transfusion 7.9.  Iron prescribed upon discharge home. 4.  Type 1 diabetes mellitus with labile sugars.  Patient was admitted with diabetic ketoacidosis.  He did have an episode of hypoglycemia.  He is on Lantus 10 units at night and short acting insulin plus custom sliding scale.  Prescriptions for pens written.  Refer to endocrinology as outpatient. 5.  Influenza A positive.  Completed Tamiflu while here in the hospital. 6.  Acute kidney injury on chronic kidney disease.  Creatinine peaked at 3.14.  Creatinine down to 1.61.  Refer to nephrology as outpatient. 7.  History of seizure in the past. 8.  Oral thrush on nystatin swish and swallow.  This has improved 9.  Chronic pain on Suboxone 10.  History of bipolar disorder on trazodone and Paxil  DISCHARGE CONDITIONS:   Satisfactory  CONSULTS OBTAINED:  Treatment Team:  Leonel Ramsay, MD Alexis Goodell, MD Clapacs, Madie Reno, MD  DRUG ALLERGIES:   Allergies  Allergen Reactions  . Benzodiazepines     Avoid prescribing. Not a true allergy  . Abilify [Aripiprazole] Rash    DISCHARGE MEDICATIONS:   Allergies as of 12/31/2017      Reactions   Benzodiazepines    Avoid prescribing. Not a true allergy   Abilify [aripiprazole] Rash  Medication List    STOP taking these medications   gabapentin 600 MG tablet Commonly known as:  NEURONTIN Replaced by:  gabapentin 400 MG capsule   ibuprofen 200 MG tablet Commonly known as:  ADVIL,MOTRIN   sertraline 100 MG tablet Commonly known as:  ZOLOFT     TAKE these medications   acetaminophen 500 MG tablet Commonly known as:  TYLENOL Take 500 mg by mouth every 6 (six) hours as needed for mild pain.   amoxicillin 500 MG capsule Commonly known as:  AMOXIL Take 2 capsules (1,000  mg total) by mouth 2 (two) times daily. Start taking on:  01/01/2018   budesonide-formoterol 160-4.5 MCG/ACT inhaler Commonly known as:  SYMBICORT Inhale 2 puffs into the lungs 2 (two) times daily.   ferrous sulfate 325 (65 FE) MG tablet Take 1 tablet (325 mg total) by mouth daily with breakfast. Start taking on:  01/01/2018   gabapentin 400 MG capsule Commonly known as:  NEURONTIN Take 2 capsules (800 mg total) by mouth 3 (three) times daily. Replaces:  gabapentin 600 MG tablet   glucagon 1 MG injection Commonly known as:  GLUCAGON EMERGENCY Inject 1 mg into the vein once as needed.   insulin aspart 100 UNIT/ML injection Commonly known as:  novoLOG 2 units prior to meals plus sliding scale depending on sugar What changed:    how much to take  how to take this  when to take this  additional instructions   insulin glargine 100 UNIT/ML injection Commonly known as:  LANTUS Inject 10 Units into the skin at bedtime.   LORazepam 0.5 MG tablet Commonly known as:  ATIVAN Take 1 tablet (0.5 mg total) by mouth every 8 (eight) hours as needed for anxiety.   multivitamin with minerals tablet Take 1 tablet by mouth daily.   nystatin 100000 UNIT/ML suspension Commonly known as:  MYCOSTATIN Take 5 mLs (500,000 Units total) by mouth 4 (four) times daily.   PARoxetine 20 MG tablet Commonly known as:  PAXIL Take 1 tablet (20 mg total) by mouth daily. Start taking on:  01/01/2018   protein supplement shake Liqd Commonly known as:  PREMIER PROTEIN Take 59.1 mLs (2 oz total) by mouth 3 (three) times daily with meals.   SUBOXONE 8-2 MG Film Generic drug:  Buprenorphine HCl-Naloxone HCl Place 1 Film under the tongue 2 (two) times daily.   traZODone 50 MG tablet Commonly known as:  DESYREL Take 3 tablets (150 mg total) by mouth at bedtime.        DISCHARGE INSTRUCTIONS:   Follow-up PMD 6 days Follow-up endocrinology in a few weeks Follow-up nephrology in a few  weeks  If you experience worsening of your admission symptoms, develop shortness of breath, life threatening emergency, suicidal or homicidal thoughts you must seek medical attention immediately by calling 911 or calling your MD immediately  if symptoms less severe.  You Must read complete instructions/literature along with all the possible adverse reactions/side effects for all the Medicines you take and that have been prescribed to you. Take any new Medicines after you have completely understood and accept all the possible adverse reactions/side effects.   Please note  You were cared for by a hospitalist during your hospital stay. If you have any questions about your discharge medications or the care you received while you were in the hospital after you are discharged, you can call the unit and asked to speak with the hospitalist on call if the hospitalist that took care of you  is not available. Once you are discharged, your primary care physician will handle any further medical issues. Please note that NO REFILLS for any discharge medications will be authorized once you are discharged, as it is imperative that you return to your primary care physician (or establish a relationship with a primary care physician if you do not have one) for your aftercare needs so that they can reassess your need for medications and monitor your lab values.    Today   CHIEF COMPLAINT:   Chief Complaint  Patient presents with  . Hyperglycemia    HISTORY OF PRESENT ILLNESS:  Maurice Davidson  is a 32 y.o. male presented with hyperglycemia and found to be septic   VITAL SIGNS:  Blood pressure (!) 150/99, pulse 97, temperature 97.7 F (36.5 C), temperature source Oral, resp. rate 18, height 5\' 9"  (1.753 m), weight 44.7 kg (98 lb 9.6 oz), SpO2 95 %.    PHYSICAL EXAMINATION:  GENERAL:  32 y.o.-year-old patient lying in the bed with no acute distress.  EYES: Pupils equal, round, reactive to light and  accommodation. No scleral icterus. Extraocular muscles intact.  HEENT: Head atraumatic, normocephalic. Oropharynx and nasopharynx clear.  NECK:  Supple, no jugular venous distention. No thyroid enlargement, no tenderness.  LUNGS: Decreased breath sounds bilateral bases, no wheezing, rales,rhonchi or crepitation. No use of accessory muscles of respiration.  CARDIOVASCULAR: S1, S2 normal. No murmurs, rubs, or gallops.  ABDOMEN: Soft, non-tender, non-distended. Bowel sounds present. No organomegaly or mass.  EXTREMITIES: No pedal edema, cyanosis, or clubbing.  NEUROLOGIC: Cranial nerves II through XII are intact. Muscle strength 5/5 in all extremities. Sensation intact. Gait not checked.  PSYCHIATRIC: The patient is alert and oriented x 3.  SKIN: No obvious rash, lesion, or ulcer.   DATA REVIEW:   CBC Recent Labs  Lab 12/31/17 0556  WBC 13.7*  HGB 7.9*  HCT 24.1*  PLT 353    Chemistries  Recent Labs  Lab 12/26/17 0501  12/27/17 0425  12/31/17 0556  NA 143   < > 139   < > 132*  K 4.1   < > 3.8   < > 4.4  CL 105   < > 101   < > 98*  CO2 31   < > 28   < > 28  GLUCOSE 229*   < > 154*   < > 413*  BUN 43*   < > 35*   < > 26*  CREATININE 1.59*   < > 1.27*   < > 1.61*  CALCIUM 7.8*   < > 8.5*   < > 8.0*  MG 1.3*  --  1.7  --   --   AST 62*  --   --   --   --   ALT 49  --   --   --   --   ALKPHOS 342*  --   --   --   --   BILITOT 0.9  --   --   --   --    < > = values in this interval not displayed.    Cardiac Enzymes No results for input(s): TROPONINI in the last 168 hours.  Microbiology Results  Results for orders placed or performed during the hospital encounter of 12/22/17  Blood Culture (routine x 2)     Status: Abnormal   Collection Time: 12/22/17  9:31 PM  Result Value Ref Range Status   Specimen Description   Final  BLOOD RIGHT ANTECUBITAL Performed at [facility], [address] [place], [place] [address]    Special Requests   Final    BOTTLES  DRAWN AEROBIC AND ANAEROBIC Blood Culture results may not be optimal due to an excessive volume of blood received in culture bottles Performed at [facility], [address] [place], [place] [address]    Culture  Setup Time   Final    Organism ID to follow [place] CRITICAL RESULT CALLED TO, READ BACK BY AND VERIFIED WITH: LISA KLUTTZ AT 9326 12/23/17 SDR Performed at [facility], [address] [place], [place] [address]    Culture (A)  Final    STREPTOCOCCUS PNEUMONIAE SUSCEPTIBILITIES PERFORMED ON PREVIOUS CULTURE WITHIN THE LAST 5 DAYS. Performed at [facility], [place] [address] [place], [place] [address]    Report Status 12/25/2017 FINAL  Final  Blood Culture (routine x 2)     Status: Abnormal   Collection Time: 12/22/17  9:31 PM  Result Value Ref Range Status   Specimen Description   Final    BLOOD BLOOD LEFT FOREARM Performed at [facility], [address], [place], [place] [address]    Special Requests   Final    BOTTLES DRAWN AEROBIC AND ANAEROBIC Blood Culture adequate volume Performed at [facility], [address] [place], [place] [address]    Culture  Setup Time   Final    GRAM POSITIVE COCCI IN BOTH AEROBIC AND ANAEROBIC BOTTLES CRITICAL VALUE NOTED.  VALUE IS CONSISTENT WITH PREVIOUSLY REPORTED AND CALLED VALUE. Performed at [facility], [address] [place], [place] [address]    Culture STREPTOCOCCUS PNEUMONIAE (A)  Final   Report Status 12/25/2017 FINAL  Final   Organism ID, Bacteria STREPTOCOCCUS PNEUMONIAE  Final      Susceptibility   Streptococcus pneumoniae - MIC*    ERYTHROMYCIN >=8 RESISTANT Resistant     LEVOFLOXACIN 1 SENSITIVE Sensitive     PENICILLIN (meningitis) 1 RESISTANT Resistant     PENICILLIN (non-meningitis) 1 SENSITIVE Sensitive     CEFTRIAXONE (non-meningitis) 1 SENSITIVE Sensitive     CEFTRIAXONE (meningitis) 1 INTERMEDIATE  Intermediate     * STREPTOCOCCUS PNEUMONIAE  Blood Culture ID Panel (Reflexed)     Status: Abnormal   Collection Time: 12/22/17  9:31 PM  Result Value Ref Range Status   Enterococcus species NOT DETECTED NOT DETECTED Final   Listeria monocytogenes NOT DETECTED NOT DETECTED Final   Staphylococcus species NOT DETECTED NOT DETECTED Final   Staphylococcus aureus NOT DETECTED NOT DETECTED Final   Streptococcus species DETECTED (A) NOT DETECTED Final    Comment: CRITICAL RESULT CALLED TO, READ BACK BY AND VERIFIED WITH:  LISA KLUTTZ AT 9379 12/23/17 SDR    Streptococcus agalactiae NOT DETECTED NOT DETECTED Final   Streptococcus pneumoniae DETECTED (A) NOT DETECTED Final    Comment: CRITICAL RESULT CALLED TO, READ BACK BY AND VERIFIED WITH:  LISA KLUTTZ AT 0240 12/23/17 SDR    Streptococcus pyogenes NOT DETECTED NOT DETECTED Final   Acinetobacter baumannii NOT DETECTED NOT DETECTED Final   Enterobacteriaceae species NOT DETECTED NOT DETECTED Final   Enterobacter cloacae complex NOT DETECTED NOT DETECTED Final   Escherichia coli NOT DETECTED NOT DETECTED Final   Klebsiella oxytoca NOT DETECTED NOT DETECTED Final   Klebsiella pneumoniae NOT DETECTED NOT DETECTED Final   Proteus species NOT DETECTED NOT DETECTED Final   Serratia marcescens NOT DETECTED  NOT DETECTED Final   Haemophilus influenzae NOT DETECTED NOT DETECTED Final   Neisseria meningitidis NOT DETECTED NOT DETECTED Final   Pseudomonas aeruginosa NOT DETECTED NOT DETECTED Final   Candida albicans NOT DETECTED NOT DETECTED Final   Candida glabrata NOT DETECTED NOT DETECTED Final   Candida krusei NOT DETECTED NOT DETECTED Final   Candida parapsilosis NOT DETECTED NOT DETECTED Final   Candida tropicalis NOT DETECTED NOT DETECTED Final    Comment: Performed at [facility], [address] [place], [place] [address]  MRSA PCR Screening     Status: Abnormal   Collection Time: 12/23/17  8:47 AM  Result Value Ref Range Status    MRSA by PCR POSITIVE (A) NEGATIVE Final    Comment:        The GeneXpert MRSA Assay (FDA approved for NASAL specimens only), is one component of a comprehensive MRSA colonization surveillance program. It is not intended to diagnose MRSA infection nor to guide or monitor treatment for MRSA infections. RESULT CALLED TO, READ BACK BY AND VERIFIED WITH: C/ MYRA FLOWERS @1020  12/23/17 [facility] Performed at [facility], [address] [place], [place] [address]   Culture, respiratory (NON-Expectorated)     Status: None   Collection Time: 12/23/17 12:24 PM  Result Value Ref Range Status   Specimen Description   Final    SPUTUM Performed at [facility], [address] [place], [place] [address]    Special Requests   Final    NONE Performed at [facility], [address] [place], [place] [address]    Gram Stain   Final    ABUNDANT WBC PRESENT,BOTH PMN AND MONONUCLEAR FEW GRAM POSITIVE COCCI    Culture   Final    Consistent with normal respiratory flora. Performed at [facility], [place] [address] [place], [place] [address]    Report Status 12/26/2017 FINAL  Final  CULTURE, BLOOD (ROUTINE X 2) w Reflex to ID Panel     Status: None   Collection Time: 12/26/17 11:27 AM  Result Value Ref Range Status   Specimen Description BLOOD LT [facility]  Final   Special Requests   Final    BOTTLES DRAWN AEROBIC AND ANAEROBIC Blood Culture adequate volume   Culture   Final    NO GROWTH 5 DAYS Performed at [facility], [address] [place], [place] [address]    Report Status 12/31/2017 FINAL  Final  CULTURE, BLOOD (ROUTINE X 2) w Reflex to ID Panel     Status: None   Collection Time: 12/26/17 11:27 AM  Result Value Ref Range Status   Specimen Description BLOOD LT [facility]  Final   Special Requests   Final    BOTTLES DRAWN AEROBIC AND ANAEROBIC Blood Culture adequate volume   Culture   Final    NO GROWTH 5 DAYS Performed at [facility], [address] [place], [place] [address]    Report Status 12/31/2017 FINAL  Final  C difficile quick scan w PCR reflex     Status: None   Collection Time: 12/27/17  8:46 AM  Result Value Ref Range Status   C Diff antigen NEGATIVE NEGATIVE Final   C Diff toxin NEGATIVE NEGATIVE Final   C Diff interpretation No C. difficile detected.  Final    Comment: Performed at [facility], [address], [place], [place] [address]       Management plans discussed with the patient, family and they  are in agreement.  CODE STATUS:     Code Status Orders  (From admission, onward)        Start     Ordered   12/23/17 0034  Full code  Continuous     12/23/17 0033    Code Status History    Date Active Date Inactive Code Status Order ID Comments User Context   11/28/2015 0059 11/30/2015 1346 Full Code [id number]  Lance Coon, MD Inpatient   06/02/2015 1002 06/04/2015 1455 Full Code [id number]  Gladstone Lighter, MD Inpatient      TOTAL TIME TAKING CARE OF THIS PATIENT: 35 minutes.    Loletha Grayer M.D on 12/31/2017 at 2:15 PM  Between 7am to 6pm - Pager - [phone]  After 6pm go to [email or web address] - Proofreader  [employer] Office  [phone]  CC: Primary care physician; Trinna Post, PA-C

## 2017-12-31 NOTE — Discharge Instructions (Signed)
apidra sliding scale:  Sugar 151-200: 1 unit; sugar 201-250 2 units; sugar 251- 300 3 units;  Sugar 301-350 4 units; greater than 351 5units

## 2017-12-31 NOTE — Progress Notes (Signed)
Patient ambulated around nurse's station on RA, sats in the middle 90's.

## 2017-12-31 NOTE — Progress Notes (Signed)
Discharge instructions reviewed with patient. Prescriptions given to patient. PICC removed without complications. Appointments given to patient. Patient awaiting w/c by volunteer.

## 2017-12-31 NOTE — Progress Notes (Signed)
Inpatient Diabetes Program Recommendations  AACE/ADA: New Consensus Statement on Inpatient Glycemic Control (2015)  Target Ranges:  Prepandial:   less than 140 mg/dL      Peak postprandial:   less than 180 mg/dL (1-2 hours)      Critically ill patients:  140 - 180 mg/dL   Lab Results  Component Value Date   GLUCAP 204 (H) 12/31/2017   HGBA1C 9.1 10/23/2017    Review of Glycemic ControlResults for LAWERANCE, MATSUO (MRN [id number]) as of 12/31/2017 10:22  Ref. Range 12/30/2017 19:49 12/31/2017 00:05 12/31/2017 04:08 12/31/2017 06:43 12/31/2017 07:42  Glucose-Capillary Latest Ref Range: 65 - 99 mg/dL 265 (H) 191 (H) 384 (H) 344 (H) 204 (H)   Diabetes history:DM1 (makes no insulin; requires basal, correction, and meal coverage) Outpatient Diabetes medications:Lantus 10 units QHS, Novolog 5 units TID with meals plus additional 1 unit for every 12 grams of carbs Current orders for Inpatient glycemic control:  Novolog 0-5 units tid with meals and HS, Lantus 8 units q HS  Inpatient Diabetes Program Recommendations:   Please consider adding Novolog meal coverage 2 units tid with meals (hold if patient eats less than 50%).  Also consider increasing Lantus to 10 units q HS.   Thanks,  Adah Perl, RN, BC-ADM Inpatient Diabetes Coordinator Pager [phone] (8a-5p)

## 2018-01-01 ENCOUNTER — Telehealth: Payer: Self-pay

## 2018-01-01 LAB — BLOOD GAS, VENOUS
ACID-BASE DEFICIT: 22.7 mmol/L — AB (ref 0.0–2.0)
Bicarbonate: 8.1 mmol/L — ABNORMAL LOW (ref 20.0–28.0)
PATIENT TEMPERATURE: 37
pCO2, Ven: 35 mmHg — ABNORMAL LOW (ref 44.0–60.0)
pH, Ven: 6.97 — CL (ref 7.250–7.430)

## 2018-01-01 NOTE — Telephone Encounter (Signed)
Transition Care Management Follow-Up Telephone Call   Date discharged and where: [facility] on 12/31/17  How have you been since you were released from the hospital? Pt states he's doing ok. He is still coughing up yellow-white mucous. Declines fever, SOB n/v/d. Pt states he is having some bilateral lower back pain but thinks this may be due to coughing and being intubated.   Any patient concerns? None.  Items Reviewed:   Meds: verified  Allergies: verified  Dietary Changes Reviewed: N/A  Functional Questionnaire:  Independent-I Dependent-D  ADLs:   Dressing- I    Eating- I   Maintaining continence- I   Transferring- I   Transportation- I   Meal Prep- I   Managing Meds- I   Confirmed importance and Date/Time of follow-up visits scheduled: 01/07/18 @ 11:00 AM.   Confirmed with patient if condition worsens to call PCP or go to the Emergency Dept. Patient was given office number and encouraged to call back with questions or concerns: YES

## 2018-01-05 NOTE — Progress Notes (Deleted)
BH MD/PA/NP OP Progress Note  01/05/2018 12:32 PM Maurice Davidson  MRN:  [id number]  Chief Complaint:  HPI:  Maurice Davidson is a 32 y.o. year old male with a history of polysubstance use disorder (cocaine, THC, percocet, benzodiazepine) on suboxone, depression, anxiety, type I diabetes, hypertension.   Per chart review, he was seen by psychiatry consult during admission at [facility] in April 8-17th for sepsis/DKA with pneumonia. It is noted that " I note that he has an order for as needed Ativan.  If this helps to keep the patient calm enough that he will comply with appropriate medical treatment so that he can get well there is probably little harm in it but I would not increase the dose of benzodiazepines and I would not recommend giving him a prescription at discharge. " However, on chart review, the patient is discharged with ativan prescription.  Per review, patient has a tendency to fixate on benzodiazepine as treatment.   Per PMP,  Ativan filled on 12/31/2017, 0.5 mg, 12 tabs for 4 days. No other benzo prescription since 04/2016     Visit Diagnosis: No diagnosis found.  Past Psychiatric History:  Outpatient:  Psychiatry admission:  Previous suicide attempt:  Past trials of medication:  History of violence:   Past Medical History:  Past Medical History:  Diagnosis Date  . Anemia   . Anxiety   . Aspiration pneumonia ([place])   . Chronic pain    take Suboxone  . Diabetes mellitus without complication ([place])   . Drug-seeking behavior   . Encephalopathy   . Gastroparesis   . H/O: GI bleed   . HTN (hypertension)   . MRSA (methicillin resistant staph aureus) culture positive   . Opioid dependence ([place])   . Renal disorder   . Seizures ([place])   . Sepsis [facility])     Past Surgical History:  Procedure Laterality Date  . ORTHOPEDIC SURGERY      Family Psychiatric History: ***  Family History:  Family History  Problem Relation Age of Onset  . Clotting disorder Sister        Von  Willebrand's disease plus polycythemia vera  . Hypertension Mother     Social History:  Social History   Socioeconomic History  . Marital status: Single    Spouse name: Not on file  . Number of children: Not on file  . Years of education: Not on file  . Highest education level: Not on file  Occupational History  . Not on file  Social Needs  . Financial resource strain: Not on file  . Food insecurity:    Worry: Not on file    Inability: Not on file  . Transportation needs:    Medical: Not on file    Non-medical: Not on file  Tobacco Use  . Smoking status: Current Some Day Smoker    Packs/day: 1.00    Types: Cigarettes  . Smokeless tobacco: Never Used  Substance and Sexual Activity  . Alcohol use: No    Alcohol/week: 0.0 oz  . Drug use: Yes    Comment: marijuana, cocaine  . Sexual activity: Not on file  Lifestyle  . Physical activity:    Days per week: Not on file    Minutes per session: Not on file  . Stress: Not on file  Relationships  . Social connections:    Talks on phone: Not on file    Gets together: Not on file    Attends religious service: Not  on file    Active member of club or organization: Not on file    Attends meetings of clubs or organizations: Not on file    Relationship status: Not on file  Other Topics Concern  . Not on file  Social History Narrative   Lives at home with parents.    Allergies:  Allergies  Allergen Reactions  . Aripiprazole Rash, Hives and Itching  . Benzodiazepines     Avoid prescribing. Not a true allergy    Metabolic Disorder Labs: Lab Results  Component Value Date   HGBA1C 9.1 10/23/2017   No results found for: PROLACTIN Lab Results  Component Value Date   CHOL 210 (H) 06/18/2017   TRIG 368 (H) 12/23/2017   HDL 42 06/18/2017   CHOLHDL 5.0 (H) 06/18/2017   VLDL 72 (H) 10/19/2012   LDLCALC 126 (H) 06/18/2017   LDLCALC 46 10/19/2012   Lab Results  Component Value Date   TSH 0.927 10/21/2017   TSH 0.33 (L)  06/18/2017    Therapeutic Level Labs: No results found for: LITHIUM No results found for: VALPROATE No components found for:  CBMZ  Current Medications: Current Outpatient Medications  Medication Sig Dispense Refill  . acetaminophen (TYLENOL) 500 MG tablet Take 500 mg by mouth every 6 (six) hours as needed for mild pain.     Marland Kitchen amoxicillin (AMOXIL) 500 MG capsule Take 2 capsules (1,000 mg total) by mouth 2 (two) times daily. 10 capsule 0  . budesonide-formoterol (SYMBICORT) 160-4.5 MCG/ACT inhaler Inhale 2 puffs into the lungs 2 (two) times daily. 1 Inhaler 1  . Buprenorphine HCl-Naloxone HCl (SUBOXONE) 8-2 MG FILM Place 1 Film under the tongue 2 (two) times daily.    . ferrous sulfate 325 (65 FE) MG tablet Take 1 tablet (325 mg total) by mouth daily with breakfast. 30 tablet 0  . gabapentin (NEURONTIN) 400 MG capsule Take 2 capsules (800 mg total) by mouth 3 (three) times daily. 90 capsule 0  . glucagon (GLUCAGON EMERGENCY) 1 MG injection Inject 1 mg into the vein once as needed. 1 each 1  . insulin aspart (NOVOLOG) 100 UNIT/ML injection 2 units prior to meals plus sliding scale depending on sugar 10 mL 11  . insulin glargine (LANTUS) 100 UNIT/ML injection Inject 10 Units into the skin at bedtime.     Marland Kitchen LORazepam (ATIVAN) 0.5 MG tablet Take 1 tablet (0.5 mg total) by mouth every 8 (eight) hours as needed for anxiety. 12 tablet 0  . Multiple Vitamins-Minerals (MULTIVITAMIN WITH MINERALS) tablet Take 1 tablet by mouth daily.    Marland Kitchen nystatin (MYCOSTATIN) 100000 UNIT/ML suspension Take 5 mLs (500,000 Units total) by mouth 4 (four) times daily. 60 mL 0  . PARoxetine (PAXIL) 20 MG tablet Take 1 tablet (20 mg total) by mouth daily. 30 tablet 0  . protein supplement shake (PREMIER PROTEIN) LIQD Take 59.1 mLs (2 oz total) by mouth 3 (three) times daily with meals. 90 Can 0  . traZODone (DESYREL) 50 MG tablet Take 3 tablets (150 mg total) by mouth at bedtime. 90 tablet 2   No current  facility-administered medications for this visit.      Musculoskeletal: Strength & Muscle Tone: within normal limits Gait & Station: normal Patient leans: N/A  Psychiatric Specialty Exam: ROS  There were no vitals taken for this visit.There is no height or weight on file to calculate BMI.  General Appearance: Fairly Groomed  Eye Contact:  Good  Speech:  Clear and Coherent  Volume:  Normal  Mood:  {BHH MOOD:22306}  Affect:  {Affect (PAA):22687}  Thought Process:  Coherent and Goal Directed  Orientation:  Full (Time, Place, and Person)  Thought Content: Logical   Suicidal Thoughts:  {ST/HT (PAA):22692}  Homicidal Thoughts:  {ST/HT (PAA):22692}  Memory:  Immediate;   Good  Judgement:  {Judgement (PAA):22694}  Insight:  {Insight (PAA):22695}  Psychomotor Activity:  Normal  Concentration:  Concentration: Good and Attention Span: Good  Recall:  Good  Fund of Knowledge: Good  Language: Good  Akathisia:  No  Handed:  Right  AIMS (if indicated): not done  Assets:  Communication Skills Desire for Improvement  ADL's:  Intact  Cognition: WNL  Sleep:  {BHH GOOD/FAIR/POOR:22877}   Screenings: PHQ2-9     Office Visit from 10/21/2017 in [place] Visit from 06/18/2017 in [place]  PHQ-2 Total Score  6  6  PHQ-9 Total Score  20  21      Assessment  Plan  The patient demonstrates the following risk factors for suicide: Chronic risk factors for suicide include: {Chronic Risk Factors for XYBFXOV:29191660}. Acute risk factors for suicide include: {Acute Risk Factors for AYOKHTX:77414239}. Protective factors for this patient include: {Protective Factors for Suicide RVUY:23343568}. Considering these factors, the overall suicide risk at this point appears to be {Desc; low/moderate/high:110033}. Patient {ACTION; IS/IS SHU:83729021} appropriate for outpatient follow up.   Norman Clay, MD 01/05/2018, 12:32 PM

## 2018-01-06 ENCOUNTER — Ambulatory Visit (HOSPITAL_COMMUNITY): Payer: Medicaid Other | Admitting: Psychiatry

## 2018-01-07 ENCOUNTER — Ambulatory Visit (INDEPENDENT_AMBULATORY_CARE_PROVIDER_SITE_OTHER): Payer: Medicaid Other | Admitting: Physician Assistant

## 2018-01-07 ENCOUNTER — Telehealth: Payer: Self-pay | Admitting: Licensed Clinical Social Worker

## 2018-01-07 ENCOUNTER — Encounter: Payer: Self-pay | Admitting: Physician Assistant

## 2018-01-07 VITALS — BP 92/58 | HR 92 | Temp 98.3°F | Wt 89.6 lb

## 2018-01-07 DIAGNOSIS — J189 Pneumonia, unspecified organism: Secondary | ICD-10-CM | POA: Diagnosis not present

## 2018-01-07 DIAGNOSIS — R636 Underweight: Secondary | ICD-10-CM | POA: Diagnosis not present

## 2018-01-07 DIAGNOSIS — F419 Anxiety disorder, unspecified: Secondary | ICD-10-CM | POA: Diagnosis not present

## 2018-01-07 DIAGNOSIS — E109 Type 1 diabetes mellitus without complications: Secondary | ICD-10-CM

## 2018-01-07 DIAGNOSIS — F4323 Adjustment disorder with mixed anxiety and depressed mood: Secondary | ICD-10-CM | POA: Diagnosis not present

## 2018-01-07 DIAGNOSIS — D649 Anemia, unspecified: Secondary | ICD-10-CM

## 2018-01-07 DIAGNOSIS — E1029 Type 1 diabetes mellitus with other diabetic kidney complication: Secondary | ICD-10-CM

## 2018-01-07 DIAGNOSIS — R809 Proteinuria, unspecified: Secondary | ICD-10-CM | POA: Diagnosis not present

## 2018-01-07 DIAGNOSIS — Z09 Encounter for follow-up examination after completed treatment for conditions other than malignant neoplasm: Secondary | ICD-10-CM | POA: Diagnosis not present

## 2018-01-07 DIAGNOSIS — N183 Chronic kidney disease, stage 3 unspecified: Secondary | ICD-10-CM

## 2018-01-07 MED ORDER — BOOST GLUCOSE CONTROL PO LIQD: ORAL | 5 refills | Status: DC

## 2018-01-07 MED ORDER — GLUCAGON (RDNA) 1 MG IJ KIT: 1.0000 mg | PACK | Freq: Once | INTRAMUSCULAR | 5 refills | Status: DC | PRN

## 2018-01-07 NOTE — Progress Notes (Signed)
Patient: Maurice Davidson Male    DOB: 07/09/1985   32 y.o.   MRN: [id number] Visit Date: 01/07/2018  Today's Provider: Trinna Post, PA-C   Chief Complaint  Patient presents with  . Hospitalization Follow-up   Subjective:    HPI  Follow up Hospitalization      Patient was admitted to [facility] on 12/22/17 and discharged on 12/31/17.  He was treated for seizures,sepsis,DKA,renal failure,pneumonia,influenza and anxiety with depressed mood. The patient was admitted to [facility] on 12/22/2017 via EMS due to altered mental status. His glucose was above 900 and he was found to be in DKA. He was intubated for respiratory failure. He was also positive for influenza A and had bilateral pneumonia. He was treated with IV cefepime, Vanc and Tamiflu. His antibiotics were switched to amoxicillin for a total of 14 days. His blood cultures were positive for Strep Pneumonia. He was transfused for a hemoglobin of 7 and was discharged on iron pills. His zoloft was changed to Paxil by psychiatry. His blood sugars proved very difficult to manage during his inpatient stay. He also suffered an AKI superimposed onto CKD. He was switched to insulin pens to manage his diabetes mellitus.   Presents with his grandmother today. Today, he reports feeling week. He weighs 86 lbs today when his previous weight was 98 lbs. He is having diarrhea but he reports this is not new for him. He denies chest pain. He is currently seeing a suboxone provider in [place]. He does not have a nephrologist, endocrinologist, or psychiatrist. He previously had referrals and appointments for all of these specialities but did not make the visits. He says now he is motivated to make these appointments and has the transportation to do so.  ------------------------------------------------------------------------------------   Allergies  Allergen Reactions  . Aripiprazole Rash, Hives and Itching  . Benzodiazepines     Avoid prescribing. Not a  true allergy     Current Outpatient Medications:  .  acetaminophen (TYLENOL) 500 MG tablet, Take 500 mg by mouth every 6 (six) hours as needed for mild pain. , Disp: , Rfl:  .  budesonide-formoterol (SYMBICORT) 160-4.5 MCG/ACT inhaler, Inhale 2 puffs into the lungs 2 (two) times daily., Disp: 1 Inhaler, Rfl: 1 .  Buprenorphine HCl-Naloxone HCl (SUBOXONE) 8-2 MG FILM, Place 1 Film under the tongue 2 (two) times daily., Disp: , Rfl:  .  ferrous sulfate 325 (65 FE) MG tablet, Take 1 tablet (325 mg total) by mouth daily with breakfast., Disp: 30 tablet, Rfl: 0 .  gabapentin (NEURONTIN) 400 MG capsule, Take 2 capsules (800 mg total) by mouth 3 (three) times daily., Disp: 90 capsule, Rfl: 0 .  glucagon (GLUCAGON EMERGENCY) 1 MG injection, Inject 1 mg into the vein once as needed., Disp: 1 each, Rfl: 1 .  insulin glargine (LANTUS) 100 UNIT/ML injection, Inject 10 Units into the skin at bedtime. , Disp: , Rfl:  .  LORazepam (ATIVAN) 0.5 MG tablet, Take 1 tablet (0.5 mg total) by mouth every 8 (eight) hours as needed for anxiety., Disp: 12 tablet, Rfl: 0 .  Multiple Vitamins-Minerals (MULTIVITAMIN WITH MINERALS) tablet, Take 1 tablet by mouth daily., Disp: , Rfl:  .  nystatin (MYCOSTATIN) 100000 UNIT/ML suspension, Take 5 mLs (500,000 Units total) by mouth 4 (four) times daily., Disp: 60 mL, Rfl: 0 .  PARoxetine (PAXIL) 20 MG tablet, Take 1 tablet (20 mg total) by mouth daily., Disp: 30 tablet, Rfl: 0 .  protein supplement shake (  PREMIER PROTEIN) LIQD, Take 59.1 mLs (2 oz total) by mouth 3 (three) times daily with meals., Disp: 90 Can, Rfl: 0 .  traZODone (DESYREL) 50 MG tablet, Take 3 tablets (150 mg total) by mouth at bedtime., Disp: 90 tablet, Rfl: 2 .  insulin aspart (NOVOLOG) 100 UNIT/ML injection, 2 units prior to meals plus sliding scale depending on sugar (Patient not taking: Reported on 01/07/2018), Disp: 10 mL, Rfl: 11  Review of Systems  Constitutional: Negative.   Respiratory: Negative.     Cardiovascular: Negative.   Neurological: Positive for weakness.    Social History   Tobacco Use  . Smoking status: Current Some Day Smoker    Packs/day: 1.00    Types: Cigarettes  . Smokeless tobacco: Never Used  Substance Use Topics  . Alcohol use: No    Alcohol/week: 0.0 oz   Objective:   BP (!) 92/58 (BP Location: Right Arm, Patient Position: Sitting, Cuff Size: Normal)   Pulse 92   Temp 98.3 F (36.8 C) (Oral)   Wt 89 lb 9.6 oz (40.6 kg)   SpO2 96%   BMI 13.23 kg/m    Physical Exam  Constitutional: He is oriented to person, place, and time. He appears cachectic. He appears ill.  Patient appears emaciated with significant fat loss and muscle atrophy. Appears pallid.   Cardiovascular: Normal rate and regular rhythm.  Pulmonary/Chest: Effort normal. No respiratory distress. He has no wheezes. He has no rales.  Decreased breath sounds in bilateral lung fields.   Neurological: He is alert and oriented to person, place, and time.  Skin: Skin is warm and dry. There is pallor.  Psychiatric: He has a normal mood and affect. His behavior is normal.        Assessment & Plan:     1. Hospital discharge follow-up  I have reviewed the discharge summary. I have reviewed all imaging and labwork associated with this hospital stay. I have reconciled the discharge medication list with current medications.  2. Multifocal pneumonia Blood cultures positive for strep pneumonia, treated for sepsis and intubated for respiratory failure. He has completed antibiotics for this. He is afebrile today with reduced breath sounds but normal rate of respiration and oxygen saturation. He will need a CXR in 4 weeks to ensure resolution of pneumonia.  - DG Chest 2 View; Future  3. Anxiety  Zoloft changed to Paxil. 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  - Ambulatory referral to Psychiatry  4. Adjustment disorder with mixed anxiety and depressed mood  - Ambulatory referral to  Psychiatry  5. Type 1 diabetes mellitus without complication ([place])  - Ambulatory referral to Endocrinology  6. Stage 3 chronic kidney disease ([place])  - Ambulatory referral to Nephrology  7. Type 1 diabetes mellitus with microalbuminuria ([facility])  Needs to see endocrinology, extremely labile blood sugars.   - glucagon (GLUCAGON EMERGENCY) 1 MG injection; Inject 1 mg into the vein once as needed.  Dispense: 1 each; Refill: 5 - Comprehensive Metabolic Panel (CMET)  8. Underweight  Extremely underweight and malnourished, due to both chronic disease and wasting from recent hospital stay and illness. Sending in boost glucose control to be taken with meals in hopes that insurance will pay for this.    9. Anemia, unspecified type  Referring to GI. Required transfusion in hospital. Could be anemia of chronic disease but he has significant iron deficiency and also diarrhea, should be worked up for Celiacs and IBD.   - CBC with Differential -  Fe+TIBC+Fer  Return in about 3 months (around 04/08/2018) for chronic illness.  The entirety of the information documented in the History of Present Illness, Review of Systems and Physical Exam were personally obtained by me. Portions of this information were initially documented by Tiburcio Pea, CMA and reviewed by me for thoroughness and accuracy.           Trinna Post, PA-C  [facility]

## 2018-01-07 NOTE — Telephone Encounter (Signed)
EMMI flagged patient for answering yes to loss of interest in things. Clinical Education officer, museum (CSW) attempted to contact patient via telephone however he did not answer and CSW left him a voicemail encouraging patient to call CSW back.   [employer], LCSW [phone]

## 2018-01-08 ENCOUNTER — Telehealth: Payer: Self-pay

## 2018-01-08 ENCOUNTER — Telehealth: Payer: Self-pay | Admitting: Licensed Clinical Social Worker

## 2018-01-08 ENCOUNTER — Other Ambulatory Visit: Payer: Self-pay | Admitting: Physician Assistant

## 2018-01-08 DIAGNOSIS — E875 Hyperkalemia: Secondary | ICD-10-CM

## 2018-01-08 DIAGNOSIS — E611 Iron deficiency: Secondary | ICD-10-CM

## 2018-01-08 LAB — CBC WITH DIFFERENTIAL/PLATELET
Basophils Absolute: 0.1 10*3/uL (ref 0.0–0.2)
Basos: 1 %
EOS (ABSOLUTE): 0 10*3/uL (ref 0.0–0.4)
Eos: 0 %
Hematocrit: 27.9 % — ABNORMAL LOW (ref 37.5–51.0)
Hemoglobin: 8.7 g/dL — ABNORMAL LOW (ref 13.0–17.7)
Immature Grans (Abs): 0.1 10*3/uL (ref 0.0–0.1)
Immature Granulocytes: 1 %
Lymphocytes Absolute: 2.4 10*3/uL (ref 0.7–3.1)
Lymphs: 24 %
MCH: 29.4 pg (ref 26.6–33.0)
MCHC: 31.2 g/dL — ABNORMAL LOW (ref 31.5–35.7)
MCV: 94 fL (ref 79–97)
Monocytes Absolute: 0.7 10*3/uL (ref 0.1–0.9)
Monocytes: 7 %
Neutrophils Absolute: 7 10*3/uL (ref 1.4–7.0)
Neutrophils: 67 %
Platelets: 816 10*3/uL (ref 150–379)
RBC: 2.96 x10E6/uL — ABNORMAL LOW (ref 4.14–5.80)
RDW: 17.4 % — ABNORMAL HIGH (ref 12.3–15.4)
WBC: 10.2 10*3/uL (ref 3.4–10.8)

## 2018-01-08 LAB — COMPREHENSIVE METABOLIC PANEL
ALT: 14 IU/L (ref 0–44)
AST: 14 IU/L (ref 0–40)
Albumin/Globulin Ratio: 1 — ABNORMAL LOW (ref 1.2–2.2)
Albumin: 3.4 g/dL — ABNORMAL LOW (ref 3.5–5.5)
Alkaline Phosphatase: 132 IU/L — ABNORMAL HIGH (ref 39–117)
BUN/Creatinine Ratio: 20 (ref 9–20)
BUN: 30 mg/dL — ABNORMAL HIGH (ref 6–20)
Bilirubin Total: <0.2 mg/dL (ref 0.0–1.2)
CO2: 19 mmol/L — ABNORMAL LOW (ref 20–29)
Calcium: 8.7 mg/dL (ref 8.7–10.2)
Chloride: 106 mmol/L (ref 96–106)
Creatinine, Ser: 1.48 mg/dL — ABNORMAL HIGH (ref 0.76–1.27)
GFR calc Af Amer: 71 mL/min/{1.73_m2} (ref >59)
GFR calc non Af Amer: 61 mL/min/{1.73_m2} (ref >59)
Globulin, Total: 3.4 g/dL (ref 1.5–4.5)
Glucose: 201 mg/dL — ABNORMAL HIGH (ref 65–99)
Potassium: 6.1 mmol/L — ABNORMAL HIGH (ref 3.5–5.2)
Sodium: 136 mmol/L (ref 134–144)
Total Protein: 6.8 g/dL (ref 6.0–8.5)

## 2018-01-08 LAB — IRON,TIBC AND FERRITIN PANEL
Ferritin: 73 ng/mL (ref 30–400)
Iron Saturation: 8 % — CL (ref 15–55)
Iron: 27 ug/dL — ABNORMAL LOW (ref 38–169)
Total Iron Binding Capacity: 322 ug/dL (ref 250–450)
UIBC: 295 ug/dL (ref 111–343)

## 2018-01-08 NOTE — Telephone Encounter (Signed)
Referral to GI placed

## 2018-01-08 NOTE — Telephone Encounter (Signed)
Patient advised of results and verbally voiced understanding. Patient agrees to have labs repeated tomorrow. Do I need put in a referral to GI or is this already in the works?

## 2018-01-08 NOTE — Telephone Encounter (Signed)
EMMI flagged patient for answering yes to loss of interest in things. Clinical Education officer, museum (CSW) attempted to contact patient via telephone for the second time and left a voicemail encouraging patient to call CSW back.   [employer], LCSW [phone]

## 2018-01-08 NOTE — Telephone Encounter (Signed)
-----   Message from Trinna Post, [place] sent at 01/08/2018  8:53 AM EDT ----- Sugar is high, expected. His potassium is high, would encourage patient to be consistent with his insulin. This is likely due in part due to his hyperglycemia and his decreased kidney function. His platelets are high, but I think this is reactive due to his illness as he has had this issue in the past. I would like to redraw his lab on Friday. His hemoglobin is increasing but his iron is very low and he will need referral GI to determine cause of this. I will print CMET and leave it up front for him to get on Friday.

## 2018-01-09 MED ORDER — GLUCAGON (RDNA) 1 MG IJ KIT: 1.0000 mg | PACK | Freq: Once | INTRAMUSCULAR | 5 refills | Status: DC | PRN

## 2018-01-09 MED ORDER — BOOST GLUCOSE CONTROL PO LIQD: ORAL | 1 refills | Status: DC

## 2018-01-09 NOTE — Addendum Note (Signed)
Addended by: Trinna Post on: 01/09/2018 03:35 PM   Modules accepted: Orders

## 2018-01-10 LAB — COMPREHENSIVE METABOLIC PANEL
ALT: 24 IU/L (ref 0–44)
AST: 23 IU/L (ref 0–40)
Albumin/Globulin Ratio: 1 — ABNORMAL LOW (ref 1.2–2.2)
Albumin: 3.5 g/dL (ref 3.5–5.5)
Alkaline Phosphatase: 118 IU/L — ABNORMAL HIGH (ref 39–117)
BUN/Creatinine Ratio: 18 (ref 9–20)
BUN: 27 mg/dL — ABNORMAL HIGH (ref 6–20)
Bilirubin Total: <0.2 mg/dL (ref 0.0–1.2)
CO2: 17 mmol/L — ABNORMAL LOW (ref 20–29)
Calcium: 8.9 mg/dL (ref 8.7–10.2)
Chloride: 107 mmol/L — ABNORMAL HIGH (ref 96–106)
Creatinine, Ser: 1.49 mg/dL — ABNORMAL HIGH (ref 0.76–1.27)
GFR calc Af Amer: 70 mL/min/{1.73_m2} (ref >59)
GFR calc non Af Amer: 61 mL/min/{1.73_m2} (ref >59)
Globulin, Total: 3.5 g/dL (ref 1.5–4.5)
Glucose: 180 mg/dL — ABNORMAL HIGH (ref 65–99)
Potassium: 5.6 mmol/L — ABNORMAL HIGH (ref 3.5–5.2)
Sodium: 140 mmol/L (ref 134–144)
Total Protein: 7 g/dL (ref 6.0–8.5)

## 2018-01-12 ENCOUNTER — Telehealth: Payer: Self-pay

## 2018-01-12 ENCOUNTER — Other Ambulatory Visit: Payer: Self-pay | Admitting: Physician Assistant

## 2018-01-12 DIAGNOSIS — E876 Hypokalemia: Secondary | ICD-10-CM

## 2018-01-12 DIAGNOSIS — F4323 Adjustment disorder with mixed anxiety and depressed mood: Secondary | ICD-10-CM

## 2018-01-12 DIAGNOSIS — F339 Major depressive disorder, recurrent, unspecified: Secondary | ICD-10-CM

## 2018-01-12 DIAGNOSIS — F1911 Other psychoactive substance abuse, in remission: Secondary | ICD-10-CM

## 2018-01-12 NOTE — Progress Notes (Signed)
Ref to psych placed, denied by [facility].

## 2018-01-12 NOTE — Telephone Encounter (Signed)
LMTCB 01/12/2018   Thanks,   -Mickel Baas

## 2018-01-12 NOTE — Telephone Encounter (Signed)
-----   Message from Trinna Post, [place] sent at 01/12/2018  2:17 PM EDT ----- Potassium has come back into a more normal range. Thanks for getting these labs. Please follow up with referrals we discussed in the clinic.

## 2018-01-14 ENCOUNTER — Other Ambulatory Visit: Payer: Self-pay | Admitting: Physician Assistant

## 2018-01-14 ENCOUNTER — Telehealth: Payer: Self-pay | Admitting: Physician Assistant

## 2018-01-14 DIAGNOSIS — D75838 Other thrombocytosis: Secondary | ICD-10-CM

## 2018-01-14 DIAGNOSIS — R7989 Other specified abnormal findings of blood chemistry: Secondary | ICD-10-CM

## 2018-01-14 NOTE — Telephone Encounter (Signed)
I did not order a CBC when he came in for his prior blood draw. I wanted to check that his platelets were improving. I do apologize. At his convenience if he could come to the lab to get blood drawn, no OV, lab slip up front.

## 2018-01-14 NOTE — Telephone Encounter (Signed)
LMTCB

## 2018-01-16 NOTE — Telephone Encounter (Signed)
Advised patient as below. Patient reports that he will be able to come by this afternoon to get his blood drawn. Would you like me to order the CBC? Please advise. Thanks!

## 2018-01-16 NOTE — Telephone Encounter (Signed)
It is ordered and printed up front.

## 2018-01-27 ENCOUNTER — Telehealth: Payer: Self-pay | Admitting: Physician Assistant

## 2018-01-27 NOTE — Telephone Encounter (Signed)
Received fax from [place] that patient No Showed appointment on 01/26/2018. This fax will be scanned into media tab.

## 2018-02-17 NOTE — Progress Notes (Signed)
Patient ID: Maurice Davidson, male   DOB: 07/09/1985, 32 y.o.   MRN: [id number]          Reason for Appointment : Consultation for Type 1 Diabetes  History of Present Illness   Referring PCP: Carles Collet   Diagnosis: Type 1 diabetes mellitus, date of diagnosis: Age 64         Previous history:   He has been mostly on Lantus insulin and mealtime insulin although when he was in middle school he was using an insulin pump for some time Not clear why he stopped using the pump Subsequently has not been followed by an endocrinologist His A1c usually is over 9%  Recent history:   INSULIN regimen is: Lantus 10 Units at bedtime, NovoLog usually 2 units before meals and correction of 1: 100 for high readings   Current management, blood sugar patterns and problems identified:       He is using a blood sugar meter from a relative and does not have his own glucose monitor   He thinks his blood sugars are high all the time and may be relatively lower at lunchtime  Especially in the morning his blood sugars are fairly consistently high and frequently over 300  However today he says his blood sugar was about 170 when he woke up and 130 before he came here later in the morning  Previously when taking 15 units of Lantus at night he would get low sugars in the mornings  Currently he is having a decreased appetite and is eating mostly a full meal in the morning and a small meal in the evening but otherwise only snacks like granola bars  Did not bring his meter for download and does not keep a written record  He is aware of carbohydrate counting but not clear if he is using this to guide his insulin regimen  His morning meal has a lot of carbohydrate with grits, milk and juice  Has only rare hypoglycemia recently   Glucose monitoring:  is being done 3-6 times a day         Glucometer: One Touch.       Blood Glucose readings from meter download:  Mean values apply above for all meters  except median for One Touch  PRE-MEAL Fasting Lunch Dinner Bedtime Overall  Glucose range: 170-300 200 275 275   Mean/median:        POST-MEAL PC Breakfast PC Lunch PC Dinner  Glucose range:     Mean/median:       Hypoglycemia:  occurs rarely  Factors causing hypoglycemia: Increased activity Symptoms of hypoglycemia: Shakiness, sweating, lightheadedness, weakness Treatment of hypoglycemia: Juice, does have glucagon available         Self-care: The diet that the patient has been following is: Avoiding high fat foods   Breakfast: Usually grits and bacon, sometimes eggs, only snacks like granola bars for lunch, small dinner         Exercise: none           Dietician consultation: Most recent: Years ago .          CDE consultation: Years ago  Diabetes labs:  Lab Results  Component Value Date   HGBA1C 9.8 (A) 02/18/2018   HGBA1C 9.1 10/23/2017   HGBA1C 9.3 06/19/2017   Lab Results  Component Value Date   MICROALBUR 100 06/19/2017   LDLCALC 126 (H) 06/18/2017   CREATININE 1.49 (H) 01/09/2018    No results found for:  MICRALBCREAT   Allergies as of 02/18/2018      Reactions   Aripiprazole Rash, Hives, Itching   Benzodiazepines    Avoid prescribing. Not a true allergy      Medication List        Accurate as of 02/18/18  1:29 PM. Always use your most recent med list.          acetaminophen 500 MG tablet Commonly known as:  TYLENOL Take 500 mg by mouth every 6 (six) hours as needed for mild pain.   budesonide-formoterol 160-4.5 MCG/ACT inhaler Commonly known as:  SYMBICORT Inhale 2 puffs into the lungs 2 (two) times daily.   ferrous sulfate 325 (65 FE) MG tablet Take 1 tablet (325 mg total) by mouth daily with breakfast.   gabapentin 400 MG capsule Commonly known as:  NEURONTIN Take 2 capsules (800 mg total) by mouth 3 (three) times daily.   glucagon 1 MG injection Commonly known as:  GLUCAGON EMERGENCY Inject 1 mg into the muscle once as needed.     insulin aspart 100 UNIT/ML injection Commonly known as:  novoLOG 2 units prior to meals plus sliding scale depending on sugar   insulin glargine 100 UNIT/ML injection Commonly known as:  LANTUS Inject 10 Units into the skin at bedtime.   LORazepam 0.5 MG tablet Commonly known as:  ATIVAN Take 1 tablet (0.5 mg total) by mouth every 8 (eight) hours as needed for anxiety.   multivitamin with minerals tablet Take 1 tablet by mouth daily.   PARoxetine 20 MG tablet Commonly known as:  PAXIL Take 1 tablet (20 mg total) by mouth daily.   SUBOXONE 8-2 MG Film Generic drug:  Buprenorphine HCl-Naloxone HCl Place 1 Film under the tongue 2 (two) times daily.   traZODone 50 MG tablet Commonly known as:  DESYREL Take 3 tablets (150 mg total) by mouth at bedtime.       Allergies:  Allergies  Allergen Reactions  . Aripiprazole Rash, Hives and Itching  . Benzodiazepines     Avoid prescribing. Not a true allergy    Past Medical History:  Diagnosis Date  . Anemia   . Anxiety   . Aspiration pneumonia ([place])   . Chronic pain    take Suboxone  . Diabetes mellitus without complication ([place])   . Drug-seeking behavior   . Encephalopathy   . Gastroparesis   . H/O: GI bleed   . HTN (hypertension)   . MRSA (methicillin resistant staph aureus) culture positive   . Opioid dependence ([place])   . Renal disorder   . Seizures ([place])   . Sepsis [facility])     Past Surgical History:  Procedure Laterality Date  . ORTHOPEDIC SURGERY      Family History  Problem Relation Age of Onset  . Clotting disorder Sister        Von Willebrand's disease plus polycythemia vera  . Hypertension Mother   . Diabetes Maternal Grandfather   . Thyroid disease Neg Hx     Social History:  reports that he has been smoking cigarettes.  He has been smoking about 1.00 pack per day. He has never used smokeless tobacco. He reports that he has current or past drug history. He reports that he does not drink  alcohol.      Review of Systems  Constitutional: Positive for reduced appetite.       Has difficulty gaining weight  HENT: Negative for headaches.   Eyes: Negative for blurred vision.  Respiratory: Positive  for shortness of breath.        He has COPD  Cardiovascular: Negative for chest pain and leg swelling.  Gastrointestinal: Positive for diarrhea. Negative for nausea.       Has frequent loose stools, no recent nausea, reportedly had gastroparesis previously  Endocrine: Positive for fatigue.  Genitourinary: Positive for nocturia.  Musculoskeletal:       Has pain in legs especially right subsequent to previous trauma  Neurological: Negative for numbness.       Has history of sharp pains in his lower legs and feet.  Does not complain of any tingling currently or numbness, taking gabapentin for relief        Lipids:   Lab Results  Component Value Date   CHOL 210 (H) 06/18/2017   HDL 42 06/18/2017   LDLCALC 126 (H) 06/18/2017   TRIG 368 (H) 12/23/2017   CHOLHDL 5.0 (H) 06/18/2017     Last eye exam 5/19, unclear whether he has retinopathy   LABS:  Office Visit on 02/18/2018  Component Date Value Ref Range Status  . Hemoglobin A1C 02/18/2018 9.8* 4.0 - 5.6 % Final    Physical Examination:  BP (!) 108/50 (BP Location: Right Arm, Patient Position: Sitting, Cuff Size: Normal)   Pulse 60   Ht 5\' 9"  (1.753 m)   Wt 88 lb 3.2 oz (40 kg)   SpO2 90%   BMI 13.02 kg/m   GENERAL:  HEENT:         Eye exam shows normal external appearance. Fundus exam shows no retinopathy. Oral exam shows normal mucosa .  NECK:         there is no lymphadenopathy.   Thyroid is not enlarged and no nodules felt.   LUNGS:         Chest is symmetrical. Lungs are clear to auscultation.Marland Kitchen   HEART:         Heart sounds:  S1 and S2 are normal. No murmurs or clicks heard., no S3 or S4.   ABDOMEN:  no distention present. Liver and spleen are not palpable. No other mass or tenderness present.   EXTREMITIES:     There is no edema. No skin lesions present.Marland Kitchen  NEUROLOGICAL:        Vibration sense is mildly reduced in toes. Ankle jerks are absent bilaterally.        Diabetic Foot Exam - Simple   Simple Foot Form Diabetic Foot exam was performed with the following findings:  Yes   Visual Inspection See comments:  Yes Sensation Testing Intact to touch and monofilament testing bilaterally:  Yes Pulse Check Posterior Tibialis and Dorsalis pulse intact bilaterally:  Yes Comments Has old injuries of the right foot and toes         MUSCULOSKELETAL:       There is no enlargement or deformity of the joints.  SKIN:       No rash, lesions or abnormal pigmentation       ASSESSMENT:  Diabetes type 1, persistently poorly controlled  His A1c is now 9.8  See history of present illness for detailed discussion of current diabetes management, blood sugar patterns and problems identified   Problems identified:  Inadequate basal insulin with current regimen of Lantus 10 units at night  Likely has some variability of his sugars related to using Lantus and likely an adequate 24-hour action of the insulin with once a day dosage  Also has an adequate coverage of meals and daytime hyperglycemia, appears  to be using mostly 2 units of insulin regardless of what he is eating and only occasionally adjusting insulin for food  Inadequate correction doses for high readings with blood sugars staying mostly over 200 reportedly  Has not got a meter for himself and inadequate supplies for testing  Needs more diabetes education for general management and adjustment of insulin  No exercise regimen  Complications: Neuropathy, unknown status of retinopathy Normal microalbumin in 2/19 Not clear if he has diabetic diarrhea, currently no symptoms suggestive of gastroparesis but has persistent decreased appetite recently  HYPERLIPIDEMIA: This needs follow-up, currently not on medications  Other active  medical problems including chronic pain, COPD, smoking history, anxiety and depression  PLAN:    He was given a new Accu-Chek guide meter which he will use before each meal and at least 2 or 3 times a day after meals to help assess blood sugar pattern  Although he is a good candidate for CGM it would be desirable for him to get an insulin pump with CGM such as Medtronic 670  C-peptide to be drawn today for this  LANTUS will need to be split to twice a day and the dose increased slightly for now  He will use 6 units twice a day and given a titration sheet with detailed explanation on increasing the evening dose every 3 days x 1 unit until morning sugars are at least under 150  He will use a calorie [employer] app to calculate carbohydrate and 6 with all meals and snacks and start using 1: 15 coverage for food  New correction dose scale given and he will use 1: 50 correction factor for blood sugars over 200  Consultation with dietitian and nurse educator  Reduce carbohydrate intake and simple sugars at breakfast and increase protein consistently  Patient Instructions  LANTUS insulin: Take 6 units 2x daily about 12 hours apart  Increase the evening dose by 1 unit every 3 days as on the flow sheet provided  Start counting carbohydrates and use 1: 15 ratio to calculate the NovoLog for the food including snacks  For high blood sugars: Blood sugar 200 = extra 1 unit Glucose 250: Add 2 units Glucose 300: Add 3 units 350 and above at 5 units extra NovoLog  Take extra insulin at lunch or suppertime for high readings even if not eating  Bring blood sugar monitor on each visit    Counseling time on subjects discussed in assessment and plan sections is over 50% of today's 60 minute visit    Elayne Snare 02/18/2018, 1:29 PM   Note: This note was prepared with Dragon voice recognition system technology. Any transcriptional errors that result from this process are  unintentional.

## 2018-02-18 ENCOUNTER — Ambulatory Visit: Payer: Medicaid Other | Admitting: Endocrinology

## 2018-02-18 ENCOUNTER — Encounter: Payer: Self-pay | Admitting: Endocrinology

## 2018-02-18 VITALS — BP 108/50 | HR 60 | Ht 69.0 in | Wt 88.2 lb

## 2018-02-18 DIAGNOSIS — E1065 Type 1 diabetes mellitus with hyperglycemia: Secondary | ICD-10-CM

## 2018-02-18 DIAGNOSIS — E1040 Type 1 diabetes mellitus with diabetic neuropathy, unspecified: Secondary | ICD-10-CM | POA: Diagnosis not present

## 2018-02-18 LAB — BASIC METABOLIC PANEL
BUN: 33 mg/dL — ABNORMAL HIGH (ref 6–23)
CO2: 24 mEq/L (ref 19–32)
CREATININE: 1.41 mg/dL (ref 0.40–1.50)
Calcium: 9.3 mg/dL (ref 8.4–10.5)
Chloride: 104 mEq/L (ref 96–112)
GFR: 61.3 mL/min (ref >60.00)
Glucose, Bld: 107 mg/dL — ABNORMAL HIGH (ref 70–99)
Potassium: 5.2 mEq/L — ABNORMAL HIGH (ref 3.5–5.1)
SODIUM: 134 meq/L — AB (ref 135–145)

## 2018-02-18 LAB — POCT GLYCOSYLATED HEMOGLOBIN (HGB A1C): HEMOGLOBIN A1C: 9.8 % — AB (ref 4.0–5.6)

## 2018-02-18 NOTE — Patient Instructions (Addendum)
LANTUS insulin: Take 6 units 2x daily about 12 hours apart  Increase the evening dose by 1 unit every 3 days as on the flow sheet provided  Start counting carbohydrates and use 1: 15 ratio to calculate the NovoLog for the food including snacks  For high blood sugars: Blood sugar 200 = extra 1 unit Glucose 250: Add 2 units Glucose 300: Add 3 units 350 and above at 5 units extra NovoLog  Take extra insulin at lunch or suppertime for high readings even if not eating  Bring blood sugar monitor on each visit

## 2018-02-19 LAB — C-PEPTIDE: C-Peptide: <0.1 ng/mL — ABNORMAL LOW (ref 1.1–4.4)

## 2018-02-20 ENCOUNTER — Other Ambulatory Visit: Payer: Self-pay | Admitting: Physician Assistant

## 2018-02-20 ENCOUNTER — Telehealth: Payer: Self-pay | Admitting: Physician Assistant

## 2018-02-20 DIAGNOSIS — F419 Anxiety disorder, unspecified: Secondary | ICD-10-CM

## 2018-02-20 MED ORDER — PAROXETINE HCL 20 MG PO TABS: 20.0000 mg | ORAL_TABLET | Freq: Every day | ORAL | 0 refills | Status: DC

## 2018-02-20 NOTE — Telephone Encounter (Signed)
Needs refill on   Paroxetine 20 mg  Vraylar 3 mg bid  [facility] uses [facility]  [address]  Call back is [phone]  Thanks teri

## 2018-02-20 NOTE — Telephone Encounter (Signed)
I will write a 30 day script for Paxil 20 mg QD because that was on his discharge summary from [facility]. I will not, however, write for Vraylar as I do not see he has ever been prescribed this and certainly not by myself. He needs to establish with [facility] or [facility] in [place] for future management of psychiatric needs.

## 2018-02-25 ENCOUNTER — Telehealth: Payer: Self-pay | Admitting: Physician Assistant

## 2018-02-25 NOTE — Telephone Encounter (Signed)
Pt called saying he is still congested and has a lingering cough from when he was in the hospital He could not come in today.  He made an appt for next week but wants to know if you can call something in for him to help with his symptoms.  [facility] [address]  His call back is [phone]  Maurice Davidson

## 2018-02-26 NOTE — Telephone Encounter (Signed)
Needs OV.  

## 2018-02-27 ENCOUNTER — Ambulatory Visit (INDEPENDENT_AMBULATORY_CARE_PROVIDER_SITE_OTHER): Payer: Medicaid Other | Admitting: Family Medicine

## 2018-02-27 ENCOUNTER — Encounter: Payer: Self-pay | Admitting: Family Medicine

## 2018-02-27 VITALS — BP 92/62 | HR 76 | Temp 97.6°F | Resp 18 | Wt 85.6 lb

## 2018-02-27 DIAGNOSIS — F419 Anxiety disorder, unspecified: Secondary | ICD-10-CM | POA: Diagnosis not present

## 2018-02-27 DIAGNOSIS — R569 Unspecified convulsions: Secondary | ICD-10-CM

## 2018-02-27 DIAGNOSIS — J40 Bronchitis, not specified as acute or chronic: Secondary | ICD-10-CM | POA: Diagnosis not present

## 2018-02-27 MED ORDER — ALBUTEROL SULFATE HFA 108 (90 BASE) MCG/ACT IN AERS: 2.0000 | INHALATION_SPRAY | Freq: Four times a day (QID) | RESPIRATORY_TRACT | 2 refills | Status: DC | PRN

## 2018-02-27 MED ORDER — DOXYCYCLINE HYCLATE 100 MG PO TABS: 100.0000 mg | ORAL_TABLET | Freq: Two times a day (BID) | ORAL | 0 refills | Status: DC

## 2018-02-27 MED ORDER — LORAZEPAM 0.5 MG PO TABS: 0.5000 mg | ORAL_TABLET | Freq: Three times a day (TID) | ORAL | 0 refills | Status: DC | PRN

## 2018-02-27 NOTE — Patient Instructions (Signed)
We will call you about the chest-x-ray result when completed. Add Mucinex as an expectorant.

## 2018-02-27 NOTE — Telephone Encounter (Signed)
Pt advised.  Apt made with Mikki Santee at [place].  Thanks,   -Mickel Baas

## 2018-02-27 NOTE — Progress Notes (Signed)
  Subjective:     Patient ID: Maurice Davidson, male   DOB: 07/09/1985, 32 y.o.   MRN: [id number] Chief Complaint  Patient presents with  . Cough    pt was hospitalized back in April with sepsis and pneumonia. He reports that he has had a cough ever since. He still has not felt well or even felt like eating. He has lost 30 pounds per pt. he is coughing up yellowish sputum. No fevers, chills. He does have some shortness of breath. Pt also wants a referral to have his esophagus stretched and a rx for Chantix to quit smoking.    HPI Reports he is compliant with Symbicort. Also wishes refill on lorazepam for breakthrough panic attacks/seizures. He is aware of precaution with Suboxone. States he is trying to find another psychiatrist and is trying to find a another prescriber for Suboxone. Currently smoking 1 ppd. Accompanied by his mother.  Review of Systems     Objective:   Physical Exam  Constitutional: He appears cachectic. No distress.  Cardiovascular: Normal rate and regular rhythm.  Pulmonary/Chest: He has wheezes (inspiratory and expiratory in posteror lung fields.).       Assessment:    1. Bronchitis: start doxycycline twice daily for 7 days - DG Chest 2 View; Future - albuterol (PROVENTIL HFA;VENTOLIN HFA) 108 (90 Base) MCG/ACT inhaler; Inhale 2 puffs into the lungs every 6 (six) hours as needed for wheezing or shortness of breath.  Dispense: 1 Inhaler; Refill: 2  2. Seizures ([place])  3. Anxiety: refill lorazepam pending psychiatrist    Plan:    Samples of Mucinex. Further f/u pending x-ray report.

## 2018-03-03 ENCOUNTER — Ambulatory Visit: Payer: Self-pay | Admitting: Physician Assistant

## 2018-03-04 ENCOUNTER — Ambulatory Visit
Admission: RE | Admit: 2018-03-04 | Discharge: 2018-03-04 | Disposition: A | Discharge status: Home / Self Care | Payer: Medicaid Other | Source: Ambulatory Visit | Attending: Family Medicine | Admitting: Family Medicine

## 2018-03-04 DIAGNOSIS — J449 Chronic obstructive pulmonary disease, unspecified: Secondary | ICD-10-CM | POA: Insufficient documentation

## 2018-03-04 DIAGNOSIS — J40 Bronchitis, not specified as acute or chronic: Secondary | ICD-10-CM

## 2018-03-04 DIAGNOSIS — R05 Cough: Secondary | ICD-10-CM | POA: Diagnosis present

## 2018-03-05 ENCOUNTER — Telehealth: Payer: Self-pay

## 2018-03-05 NOTE — Telephone Encounter (Signed)
-----   Message from Carmon Ginsberg, [place] sent at 03/04/2018 11:58 AM EDT ----- No pneumonia

## 2018-03-05 NOTE — Telephone Encounter (Signed)
Pt. Advised, KW

## 2018-03-07 ENCOUNTER — Other Ambulatory Visit: Payer: Self-pay | Admitting: Family Medicine

## 2018-03-13 ENCOUNTER — Encounter: Payer: Self-pay | Admitting: Physician Assistant

## 2018-03-13 ENCOUNTER — Other Ambulatory Visit: Payer: Self-pay

## 2018-03-13 ENCOUNTER — Other Ambulatory Visit: Payer: Self-pay | Admitting: Physician Assistant

## 2018-03-13 DIAGNOSIS — Z765 Malingerer [conscious simulation]: Secondary | ICD-10-CM | POA: Insufficient documentation

## 2018-03-13 DIAGNOSIS — E1029 Type 1 diabetes mellitus with other diabetic kidney complication: Secondary | ICD-10-CM

## 2018-03-13 DIAGNOSIS — R12 Heartburn: Secondary | ICD-10-CM

## 2018-03-13 DIAGNOSIS — R809 Proteinuria, unspecified: Secondary | ICD-10-CM

## 2018-03-13 DIAGNOSIS — F1910 Other psychoactive substance abuse, uncomplicated: Secondary | ICD-10-CM | POA: Insufficient documentation

## 2018-03-13 MED ORDER — PANTOPRAZOLE SODIUM 40 MG PO TBEC: 40.0000 mg | DELAYED_RELEASE_TABLET | Freq: Every day | ORAL | 3 refills | Status: AC

## 2018-03-13 MED ORDER — GLUCAGON (RDNA) 1 MG IJ KIT: 1.0000 mg | PACK | Freq: Once | INTRAMUSCULAR | 5 refills | Status: DC | PRN

## 2018-03-13 NOTE — Telephone Encounter (Signed)
Patient requesting refills. He also needs refill on pantoprazole 40mg . Please review. Thanks!

## 2018-03-13 NOTE — Telephone Encounter (Signed)
Patient actually never told me he was taking suboxone, I had to look it up on the controlled substance website. But this is not relevant. The point is I am not refilling it. He needs a PSYCHIATRIST to treat his mental health issues, which I have been trying to get him established with since our first visit. [place] has denied his referral. He has been denied multiple times that this facility. His options have been stated to him as [facility] or [facility] in [place]. These are self-referral, meaning the patient can show up and establish himself. I am not refilling this medication. The answer is NO. And I am not giving a short term script until he gets established with somebody.

## 2018-03-13 NOTE — Telephone Encounter (Signed)
Patient advised as below. Patient states he does not have a psychiatrist and he no longer takes Suboxone. Patient says in the past, this was the main reason that you didn't want to prescribed the Lorazepam. He says since he is no longer taking the Suboxone or seeing that doctor that prescribed the it, would you refill the medication? If not, he wants to know what he is supposed to do to get this refilled? He says he does not have a psychiatrist.

## 2018-03-13 NOTE — Telephone Encounter (Signed)
I refilled the glucagon and pantoprazole. I am not refilling his Lorazepam. I have never filled any benzodiazepine for him. I have told him explicitly that I will never fill this for him, and in fact he received this medication from a different provider while I was out of office. He has been consulted on by psychiatry in the hospital who recommended NOT filling any outpatient benzodiazepines for him. And NO other provider in this clinic should be filling it for him. He needs to see a psychiatrist.

## 2018-03-15 NOTE — Progress Notes (Deleted)
Patient ID: Maurice Davidson, male   DOB: 07/09/1985, 32 y.o.   MRN: [id number]          Reason for Appointment : Consultation for Type 1 Diabetes  History of Present Illness   Referring PCP: Carles Collet   Diagnosis: Type 1 diabetes mellitus, date of diagnosis: Age 79         Previous history:   He has been mostly on Lantus insulin and mealtime insulin although when he was in middle school he was using an insulin pump for some time Not clear why he stopped using the pump Subsequently has not been followed by an endocrinologist His A1c usually is over 9%  Recent history:   INSULIN regimen is: Lantus 10 Units at bedtime, NovoLog usually 2 units before meals and correction of 1: 100 for high readings   Current management, blood sugar patterns and problems identified:       He is using a blood sugar meter from a relative and does not have his own glucose monitor   He thinks his blood sugars are high all the time and may be relatively lower at lunchtime  Especially in the morning his blood sugars are fairly consistently high and frequently over 300  However today he says his blood sugar was about 170 when he woke up and 130 before he came here later in the morning  Previously when taking 15 units of Lantus at night he would get low sugars in the mornings  Currently he is having a decreased appetite and is eating mostly a full meal in the morning and a small meal in the evening but otherwise only snacks like granola bars  Did not bring his meter for download and does not keep a written record  He is aware of carbohydrate counting but not clear if he is using this to guide his insulin regimen  His morning meal has a lot of carbohydrate with grits, milk and juice  Has only rare hypoglycemia recently   Glucose monitoring:  is being done 3-6 times a day         Glucometer: One Touch.       Blood Glucose readings from meter download:  Mean values apply above for all meters  except median for One Touch  PRE-MEAL Fasting Lunch Dinner Bedtime Overall  Glucose range: 170-300 200 275 275   Mean/median:        POST-MEAL PC Breakfast PC Lunch PC Dinner  Glucose range:     Mean/median:       Hypoglycemia:  occurs rarely  Factors causing hypoglycemia: Increased activity Symptoms of hypoglycemia: Shakiness, sweating, lightheadedness, weakness Treatment of hypoglycemia: Juice, does have glucagon available         Self-care: The diet that the patient has been following is: Avoiding high fat foods   Breakfast: Usually grits and bacon, sometimes eggs, only snacks like granola bars for lunch, small dinner         Exercise: none           Dietician consultation: Most recent: Years ago .          CDE consultation: Years ago  Diabetes labs:  Lab Results  Component Value Date   HGBA1C 9.8 (A) 02/18/2018   HGBA1C 9.1 10/23/2017   HGBA1C 9.3 06/19/2017   Lab Results  Component Value Date   MICROALBUR 100 06/19/2017   LDLCALC 126 (H) 06/18/2017   CREATININE 1.41 02/18/2018    No results found for: MICRALBCREAT  Allergies as of 03/16/2018      Reactions   Aripiprazole Rash, Hives, Itching   Benzodiazepines    DO NOT PRESCRIBE. Not true allergy.       Medication List        Accurate as of 03/15/18  8:42 PM. Always use your most recent med list.          acetaminophen 500 MG tablet Commonly known as:  TYLENOL Take 500 mg by mouth every 6 (six) hours as needed for mild pain.   albuterol 108 (90 Base) MCG/ACT inhaler Commonly known as:  PROVENTIL HFA;VENTOLIN HFA Inhale 2 puffs into the lungs every 6 (six) hours as needed for wheezing or shortness of breath.   budesonide-formoterol 160-4.5 MCG/ACT inhaler Commonly known as:  SYMBICORT Inhale 2 puffs into the lungs 2 (two) times daily.   doxycycline 100 MG tablet Commonly known as:  VIBRA-TABS Take 1 tablet (100 mg total) by mouth 2 (two) times daily.   ferrous sulfate 325 (65 FE) MG  tablet Take 1 tablet (325 mg total) by mouth daily with breakfast.   gabapentin 400 MG capsule Commonly known as:  NEURONTIN Take 2 capsules (800 mg total) by mouth 3 (three) times daily.   glucagon 1 MG injection Commonly known as:  GLUCAGON EMERGENCY Inject 1 mg into the muscle once as needed.   insulin aspart 100 UNIT/ML injection Commonly known as:  novoLOG 2 units prior to meals plus sliding scale depending on sugar   insulin glargine 100 UNIT/ML injection Commonly known as:  LANTUS Inject 10 Units into the skin at bedtime.   multivitamin with minerals tablet Take 1 tablet by mouth daily.   pantoprazole 40 MG tablet Commonly known as:  PROTONIX Take 1 tablet (40 mg total) by mouth daily.   PARoxetine 20 MG tablet Commonly known as:  PAXIL Take 1 tablet (20 mg total) by mouth daily.   SUBOXONE 8-2 MG Film Generic drug:  Buprenorphine HCl-Naloxone HCl Place 1 Film under the tongue 2 (two) times daily.   traZODone 50 MG tablet Commonly known as:  DESYREL Take 3 tablets (150 mg total) by mouth at bedtime.   zolpidem 10 MG tablet Commonly known as:  AMBIEN Take 10 mg by mouth at bedtime.       Allergies:  Allergies  Allergen Reactions  . Aripiprazole Rash, Hives and Itching  . Benzodiazepines     DO NOT PRESCRIBE. Not true allergy.     Past Medical History:  Diagnosis Date  . Anemia   . Anxiety   . Aspiration pneumonia ([place])   . Chronic pain    take Suboxone  . Diabetes mellitus without complication ([place])   . Drug-seeking behavior   . Encephalopathy   . Gastroparesis   . H/O: GI bleed   . HTN (hypertension)   . MRSA (methicillin resistant staph aureus) culture positive   . Opioid dependence ([place])   . Renal disorder   . Seizures ([place])   . Sepsis [facility])     Past Surgical History:  Procedure Laterality Date  . ORTHOPEDIC SURGERY      Family History  Problem Relation Age of Onset  . Clotting disorder Sister        Von Willebrand's disease  plus polycythemia vera  . Hypertension Mother   . Diabetes Maternal Grandfather   . Thyroid disease Neg Hx     Social History:  reports that he has been smoking cigarettes.  He has been smoking about 1.00 pack  per day. He has never used smokeless tobacco. He reports that he has current or past drug history. He reports that he does not drink alcohol.      Review of Systems  Constitutional: Positive for reduced appetite.       Has difficulty gaining weight  HENT: Negative for headaches.   Eyes: Negative for blurred vision.  Respiratory: Positive for shortness of breath.        He has COPD  Cardiovascular: Negative for chest pain and leg swelling.  Gastrointestinal: Positive for diarrhea. Negative for nausea.       Has frequent loose stools, no recent nausea, reportedly had gastroparesis previously  Endocrine: Positive for fatigue.  Genitourinary: Positive for nocturia.  Musculoskeletal:       Has pain in legs especially right subsequent to previous trauma  Neurological: Negative for numbness.       Has history of sharp pains in his lower legs and feet.  Does not complain of any tingling currently or numbness, taking gabapentin for relief        Lipids:   Lab Results  Component Value Date   CHOL 210 (H) 06/18/2017   HDL 42 06/18/2017   LDLCALC 126 (H) 06/18/2017   TRIG 368 (H) 12/23/2017   CHOLHDL 5.0 (H) 06/18/2017     Last eye exam 5/19, unclear whether he has retinopathy   LABS:  No visits with results within 1 Week(s) from this visit.  Latest known visit with results is:  Office Visit on 02/18/2018  Component Date Value Ref Range Status  . Hemoglobin A1C 02/18/2018 9.8* 4.0 - 5.6 % Final  . Sodium 02/18/2018 134* 135 - 145 mEq/L Final  . Potassium 02/18/2018 5.2* 3.5 - 5.1 mEq/L Final  . Chloride 02/18/2018 104  96 - 112 mEq/L Final  . CO2 02/18/2018 24  19 - 32 mEq/L Final  . Glucose, Bld 02/18/2018 107* 70 - 99 mg/dL Final  . BUN 02/18/2018 33* 6 - 23 mg/dL  Final  . Creatinine, Ser 02/18/2018 1.41  0.40 - 1.50 mg/dL Final  . Calcium 02/18/2018 9.3  8.4 - 10.5 mg/dL Final  . GFR 02/18/2018 61.30  >60.00 mL/min Final  . C-Peptide 02/18/2018 <0.1* 1.1 - 4.4 ng/mL Final   C-Peptide reference interval is for fasting patients.    Physical Examination:  There were no vitals taken for this visit.  GENERAL:  HEENT:         Eye exam shows normal external appearance. Fundus exam shows no retinopathy. Oral exam shows normal mucosa .  NECK:         there is no lymphadenopathy.   Thyroid is not enlarged and no nodules felt.   LUNGS:         Chest is symmetrical. Lungs are clear to auscultation.Marland Kitchen   HEART:         Heart sounds:  S1 and S2 are normal. No murmurs or clicks heard., no S3 or S4.   ABDOMEN:  no distention present. Liver and spleen are not palpable. No other mass or tenderness present.  EXTREMITIES:     There is no edema. No skin lesions present.Marland Kitchen  NEUROLOGICAL:        Vibration sense is mildly reduced in toes. Ankle jerks are absent bilaterally.        Diabetic Foot Exam - Simple   No data filed          MUSCULOSKELETAL:       There is no enlargement or  deformity of the joints.  SKIN:       No rash, lesions or abnormal pigmentation       ASSESSMENT:  Diabetes type 1, persistently poorly controlled  His A1c is  9.8  See history of present illness for detailed discussion of current diabetes management, blood sugar patterns and problems identified   Problems identified:  Inadequate basal insulin with current regimen of Lantus 10 units at night  Likely has some variability of his sugars related to using Lantus and likely an adequate 24-hour action of the insulin with once a day dosage  Also has an adequate coverage of meals and daytime hyperglycemia, appears to be using mostly 2 units of insulin regardless of what he is eating and only occasionally adjusting insulin for food  Inadequate correction doses for high readings with  blood sugars staying mostly over 200 reportedly  Has not got a meter for himself and inadequate supplies for testing  Needs more diabetes education for general management and adjustment of insulin  No exercise regimen  Complications: Neuropathy, unknown status of retinopathy Normal microalbumin in 2/19 Not clear if he has diabetic diarrhea, currently no symptoms suggestive of gastroparesis but has persistent decreased appetite recently  HYPERLIPIDEMIA: This needs follow-up, currently not on medications  Other active medical problems including chronic pain, COPD, smoking history, anxiety and depression  PLAN:    He was given a new Accu-Chek guide meter which he will use before each meal and at least 2 or 3 times a day after meals to help assess blood sugar pattern  Although he is a good candidate for CGM it would be desirable for him to get an insulin pump with CGM such as Medtronic 670  C-peptide to be drawn today for this  LANTUS will need to be split to twice a day and the dose increased slightly for now  He will use 6 units twice a day and given a titration sheet with detailed explanation on increasing the evening dose every 3 days x 1 unit until morning sugars are at least under 150  He will use a calorie [employer] app to calculate carbohydrate and 6 with all meals and snacks and start using 1: 15 coverage for food  New correction dose scale given and he will use 1: 50 correction factor for blood sugars over 200  Consultation with dietitian and nurse educator  Reduce carbohydrate intake and simple sugars at breakfast and increase protein consistently  There are no Patient Instructions on file for this visit. Counseling time on subjects discussed in assessment and plan sections is over 50% of today's 60 minute visit    Elayne Snare 03/15/2018, 8:42 PM   Note: This note was prepared with Dragon voice recognition system technology. Any transcriptional errors that  result from this process are unintentional.

## 2018-03-16 ENCOUNTER — Ambulatory Visit: Payer: Self-pay | Admitting: Endocrinology

## 2018-03-18 ENCOUNTER — Ambulatory Visit: Payer: Self-pay | Admitting: Endocrinology

## 2018-03-23 ENCOUNTER — Encounter: Payer: Medicaid Other | Admitting: Dietician

## 2018-03-23 ENCOUNTER — Telehealth: Payer: Self-pay | Admitting: Dietician

## 2018-03-23 ENCOUNTER — Encounter: Payer: Medicaid Other | Attending: Physician Assistant | Admitting: Dietician

## 2018-03-23 DIAGNOSIS — Z713 Dietary counseling and surveillance: Secondary | ICD-10-CM | POA: Diagnosis present

## 2018-03-23 DIAGNOSIS — E109 Type 1 diabetes mellitus without complications: Secondary | ICD-10-CM | POA: Insufficient documentation

## 2018-03-23 DIAGNOSIS — K3184 Gastroparesis: Secondary | ICD-10-CM | POA: Insufficient documentation

## 2018-03-23 DIAGNOSIS — E43 Unspecified severe protein-calorie malnutrition: Secondary | ICD-10-CM

## 2018-03-23 DIAGNOSIS — E108 Type 1 diabetes mellitus with unspecified complications: Secondary | ICD-10-CM

## 2018-03-23 NOTE — Telephone Encounter (Signed)
See 03/23/18 RD note.

## 2018-03-23 NOTE — Patient Instructions (Addendum)
Call Dr. Alice Reichert, gastroenterologist [phone] Avoid skipping meals Use protein shakes 2 per day if unable to tolerate solid food.  Download the Calorie [employer] on your I-phone. Practice counting carbohydrate counting. Novolog 1 unit per every 15 grams of carbohydrates PLUS  1 unit for every 50 points above 150        Glucose 200= 1 unit            250=2 units            300= 3 units            350=5 units (per Dr. Dwyane Dee note)

## 2018-03-23 NOTE — Progress Notes (Deleted)
The original result note has been redacted due to error.

## 2018-03-26 ENCOUNTER — Encounter: Payer: Self-pay | Admitting: Dietician

## 2018-03-26 ENCOUNTER — Telehealth: Payer: Self-pay | Admitting: Dietician

## 2018-03-26 NOTE — Telephone Encounter (Signed)
Called patient and was unable to leave a message on his phone or his mothers phone. Left a message on his father's phone for him to call to make an appointment with Dr. Alice Reichert (gastroenterologiest) at [phone].   I also called Dr. Ricky Stabs office who stated that he has had a no show last fall and another in May and that patient needs to make the appointment.  Antonieta Iba, RD, LDN, CDE

## 2018-03-26 NOTE — Patient Instructions (Signed)
Call Dr. Alice Reichert, gastroenterologist [phone] Avoid skipping meals Use protein shakes 2 per day if unable to tolerate solid food.  Download the Calorie [employer] on your I-phone. Practice counting carbohydrate counting. Novolog 1 unit per every 15 grams of carbohydrates PLUS             1 unit for every 50 points above 150                   Glucose 200= 1 unit                                  250=2 units                                  300= 3 units                                  350=5 units (per Dr. Dwyane Dee note)

## 2018-03-26 NOTE — Progress Notes (Signed)
Diabetes Self-Management Education  Visit Type: First/Initial  Appt. Start Time: 1400 Appt. End Time: 1500 Patient left early due to nausea and abdominal pain.  03/26/2018  [name], identified by name and date of birth, is a 32 y.o. male with a diagnosis of Diabetes: Type 1. Other history includes iron deficiency, gastroparesis, diarrhea, constipation, COPD, HTN, CKD, bipolar (per patient), drug seeking. Weight today 88 lbs. He is underweight (BMI 14.6) and has severe temporal wasting.  Patient meets criteria for malnutrition. Labs include:  02/18/18:  Potassium 5.2, BUN 33, Creatine 1.41, eGFR 61, cpeptide <0.1.  HgbA1C 8.7 It would be desirable to get him on an insulin pump per MD. Medications include Lantus 6 units bid, Novolog 2 units before meals (correction 1:50, CHO ratio (1:15)  Patient lives with his parents and is on disability.  Patient has been avoiding high fat foods (mostly), carbohydrate counting a small amounts.  At today's visit, patient complains of increasing nausea, constipation, concerns of gastroparesis, burping sulphur smell.  He has a referral for a gastroenterologist.  He missed an appointment in the fall and one in May.  Encouraged patient that he should call for an appointment with Dr. Alice Reichert [phone]. He requested a psychiatrist as he is having a lot of swings with his bipolar.  Discussed that he needs to speak with his MD for a referral.  He also stated that he needed another inhaler.  Directed him to his MD.  ASSESSMENT  Height '5\' 5"'  (1.651 m), weight 88 lb (39.9 kg). Body mass index is 14.64 kg/m.  Diabetes Self-Management Education - 03/26/18 1104      Visit Information   Visit Type  First/Initial      Initial Visit   Diabetes Type  Type 1    Are you currently following a meal plan?  Yes    What type of meal plan do you follow?  low fat, counts carbohydrates    Are you taking your medications as prescribed?  Yes    Date Diagnosed  [place]   How would you rate your overall health?  Poor      Psychosocial Assessment   Patient Belief/Attitude about Diabetes  Motivated to manage diabetes    Self-care barriers  None    Self-management support  Doctor's office;Family    Other persons present  Patient    Patient Concerns  Nutrition/Meal planning    Special Needs  None    Preferred Learning Style  No preference indicated    Learning Readiness  Other (comment) nauseous and abdominal pain    How often do you need to have someone help you when you read instructions, pamphlets, or other written materials from your doctor or pharmacy?  1 - Never    What is the last grade level you completed in school?  GED      Pre-Education Assessment   Patient understands the diabetes disease and treatment process.  Needs Review    Patient understands incorporating nutritional management into lifestyle.  Needs Review    Patient undertands incorporating physical activity into lifestyle.  Needs Review    Patient understands using medications safely.  Needs Review    Patient understands monitoring blood glucose, interpreting and using results  Needs Review    Patient understands prevention, detection, and treatment of acute complications.  Needs Review    Patient understands prevention, detection, and treatment of chronic complications.  Needs Review    Patient understands how  to develop strategies to address psychosocial issues.  Needs Review    Patient understands how to develop strategies to promote health/change behavior.  Needs Review      Complications   Last HgB A1C per patient/outside source  9.8 %    How often do you check your blood sugar?  > 4 times/day    Fasting Blood glucose range (mg/dL)  130-179    Postprandial Blood glucose range (mg/dL)  180-200    Number of hypoglycemic episodes per month  0    Number of hyperglycemic episodes per week  7    Have you had a dilated eye exam in the past 12 months?  Yes    Have you  had a dental exam in the past 12 months?  Yes    Are you checking your feet?  Yes    How many days per week are you checking your feet?  7      Dietary Intake   Breakfast  grits, bacon, occasional eggs    Lunch  granola bars    Dinner  something small    Beverage(s)  water      Exercise   Exercise Type  ADL's      Patient Education   Previous Diabetes Education  Yes (please comment)    Nutrition management   Carbohydrate counting;Meal options for control of blood glucose level and chronic complications.    Medications  Reviewed patients medication for diabetes, action, purpose, timing of dose and side effects.    Acute complications  Taught treatment of hypoglycemia - the 15 rule.    Chronic complications  Relationship between chronic complications and blood glucose control;Identified and discussed with patient  current chronic complications    Psychosocial adjustment  Worked with patient to identify barriers to care and solutions      Individualized Goals (developed by patient)   Nutrition  General guidelines for healthy choices and portions discussed;Other (comment) count carbohydrates, small frequent meals,     Physical Activity  Not Applicable    Medications  take my medication as prescribed    Monitoring   test my blood glucose as discussed    Problem Solving  GI problems and nutrition    Reducing Risk  examine blood glucose patterns    Health Coping  discuss diabetes with (comment) MD, RD, CDE, psychiatrist/counselor      Post-Education Assessment   Patient understands the diabetes disease and treatment process.  Demonstrates understanding / competency    Patient understands incorporating nutritional management into lifestyle.  Needs Review    Patient undertands incorporating physical activity into lifestyle.  Demonstrates understanding / competency    Patient understands using medications safely.  Demonstrates understanding / competency    Patient understands monitoring  blood glucose, interpreting and using results  Demonstrates understanding / competency    Patient understands prevention, detection, and treatment of acute complications.  Demonstrates understanding / competency    Patient understands prevention, detection, and treatment of chronic complications.  Demonstrates understanding / competency    Patient understands how to develop strategies to address psychosocial issues.  Needs Review    Patient understands how to develop strategies to promote health/change behavior.  Needs Review      Outcomes   Expected Outcomes  Demonstrated interest in learning. Expect positive outcomes    Future DMSE  4-6 wks    Program Status  Not Completed       Individualized Plan for Diabetes Self-Management Training:   Learning  Objective:  Patient will have a greater understanding of diabetes self-management. Patient education plan is to attend individual and/or group sessions per assessed needs and concerns.   Plan:   Patient Instructions   Call Dr. Alice Reichert, gastroenterologist [phone] Avoid skipping meals Use protein shakes 2 per day if unable to tolerate solid food.  Download the Calorie [employer] on your I-phone. Practice counting carbohydrate counting. Novolog 1 unit per every 15 grams of carbohydrates PLUS             1 unit for every 50 points above 150                   Glucose 200= 1 unit                                  250=2 units                                  300= 3 units                                  350=5 units (per Dr. Dwyane Dee note)      Expected Outcomes:  Demonstrated interest in learning. Expect positive outcomes  Education material provided: Meal plan card and Snack sheet  If problems or questions, patient to contact team via:  Phone  Future DSME appointment: 4-6 wks

## 2018-04-08 ENCOUNTER — Ambulatory Visit: Payer: Medicaid Other | Admitting: Physician Assistant

## 2018-04-16 ENCOUNTER — Other Ambulatory Visit: Payer: Self-pay | Admitting: Physician Assistant

## 2018-04-16 DIAGNOSIS — Z720 Tobacco use: Secondary | ICD-10-CM

## 2018-04-17 ENCOUNTER — Ambulatory Visit: Payer: Medicaid Other | Admitting: Dietician

## 2018-05-29 ENCOUNTER — Encounter: Payer: Self-pay | Admitting: Family Medicine

## 2018-05-29 ENCOUNTER — Ambulatory Visit: Payer: Medicaid Other | Admitting: Family Medicine

## 2018-05-29 ENCOUNTER — Telehealth: Payer: Self-pay

## 2018-05-29 VITALS — BP 98/56 | HR 88 | Temp 97.9°F | Resp 16 | Ht 63.0 in | Wt 84.0 lb

## 2018-05-29 DIAGNOSIS — Z01818 Encounter for other preprocedural examination: Secondary | ICD-10-CM

## 2018-05-29 DIAGNOSIS — F319 Bipolar disorder, unspecified: Secondary | ICD-10-CM | POA: Diagnosis not present

## 2018-05-29 NOTE — Telephone Encounter (Signed)
Patent advised he will contact [place]. KW

## 2018-05-29 NOTE — Progress Notes (Addendum)
   Subjective:     Patient ID: Maurice Davidson, male   DOB: 07/09/1985, 32 y.o.   MRN: [id number] Chief Complaint  Patient presents with  . Medical Clearance    Patient comes in office today to get medical clearance to have a dental procedure done to place implants in his mouth, patient states at this time no appt has been scheduled.    HPI Reports compliance with respiratory medications and states breathing is under control with use of current inhalers. States he is no longer on suboxone and denies recreational drug use. He does ask for refills on alprazolam and Zolpidem today. We are referring him to psychiatry for further evaluation and treatment of bipolar disorder and they will be prescribing his medication. He continues to be followed by endocrinology, Dr. Dwyane Dee, for diabetes. States his fasting blood sugar was 91 this AM. He is accompanied by his grandmother today.  Review of Systems     Objective:   Physical Exam  Constitutional: He appears cachectic. No distress.  HENT:  Many teeth missing or carious in appearance.  Cardiovascular: Normal rate and regular rhythm.  Pulmonary/Chest: Breath sounds normal.       Assessment:    1. Pre-operative examination: stable for dental surgery  2. Bipolar I disorder ([name]) - Ambulatory referral to Psychiatry    Plan:    Will see if pre-op form available or will send in copy of this note if required. Addendum: Spoke to [name] [phone]). They last saw him in September of last year. They generally give the patients the pre-op surgical form to bring to their physician.

## 2018-05-29 NOTE — Patient Instructions (Signed)
We will call you about the psychiatry referral. I will initiate contact with your dentist.

## 2018-05-29 NOTE — Telephone Encounter (Signed)
-----   Message from Carmon Ginsberg, [place] sent at 05/29/2018  2:01 PM EDT ----- I called [facility]. Ibrahe[facility] should have or will have to obtain the pre-op form for me to fill out.

## 2018-06-05 ENCOUNTER — Ambulatory Visit: Payer: Self-pay | Admitting: Endocrinology

## 2018-06-05 DIAGNOSIS — Z0289 Encounter for other administrative examinations: Secondary | ICD-10-CM

## 2018-06-08 ENCOUNTER — Encounter: Payer: Self-pay | Admitting: Endocrinology

## 2018-06-16 ENCOUNTER — Telehealth: Payer: Self-pay | Admitting: Endocrinology

## 2018-06-16 NOTE — Telephone Encounter (Signed)
Patient dismissed from [facility] by Dr. Elayne Snare, effective 06/08/18. Dismissal letter sent 1st class mail.   06/16/18  LM

## 2018-09-15 ENCOUNTER — Ambulatory Visit: Payer: Self-pay | Admitting: Family Medicine

## 2018-10-05 IMAGING — DX DG CHEST 1V PORT
1 series · 1 of 1 positions shown · non-contrast
Comparison: Chest radiograph January 15, 2016

CLINICAL DATA: Combative, agitation, hyperglycemia. History of
diabetes, sepsis.

EXAM:
PORTABLE CHEST 1 VIEW

[chest ap]
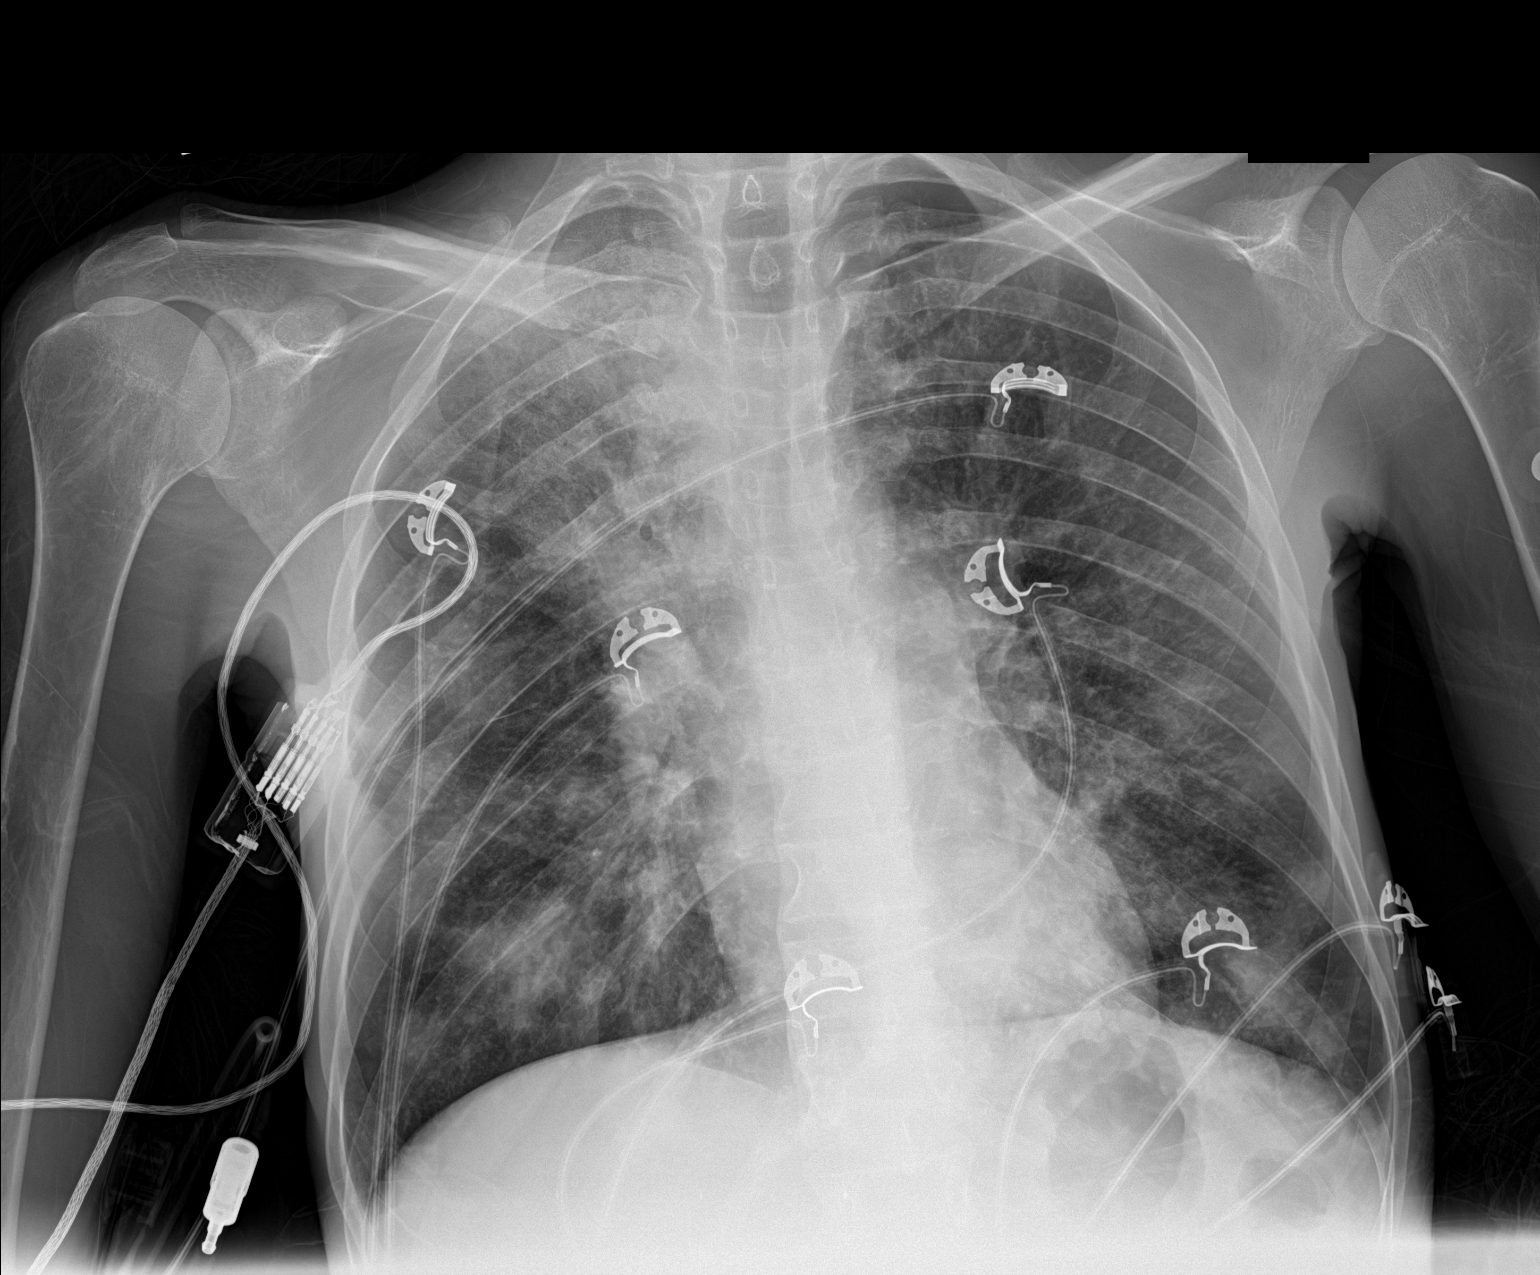

[1 of 1 positions shown; findings below may reference images not displayed]

FINDINGS: Cardiomediastinal silhouette is normal. Multifocal consolidation
bilateral lungs without pleural effusion. No pneumothorax. Soft
tissue planes and included osseous structures are nonsuspicious.
IMPRESSION: Multifocal pneumonia, potential septic emboli etiology.

## 2018-10-05 IMAGING — DX DG ABD PORTABLE 1V
1 series · 1 of 1 positions shown · non-contrast
Comparison: None.

CLINICAL DATA: NG tube

EXAM:
PORTABLE ABDOMEN - 1 VIEW

[abdomen kub]
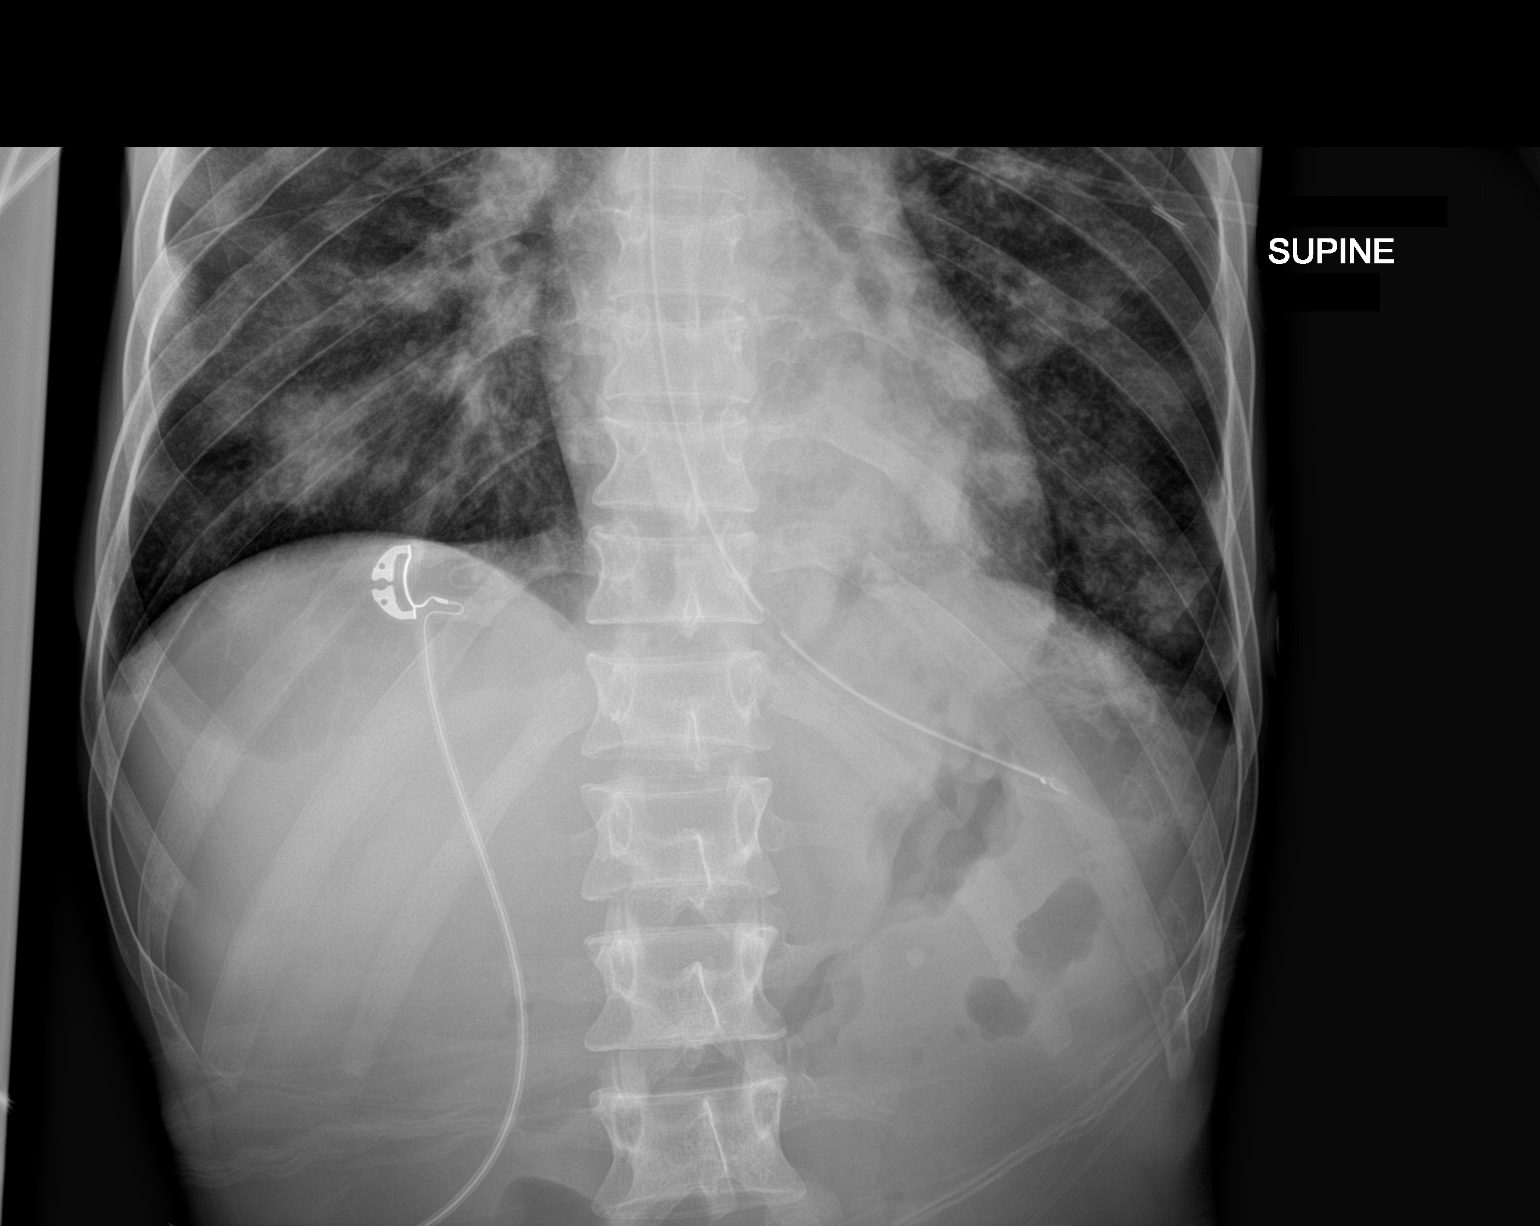

[1 of 1 positions shown; findings below may reference images not displayed]

FINDINGS: Multifocal pulmonary infiltrates. Esophageal tube tip overlies the
proximal stomach, side-port projects over the distal esophagus
IMPRESSION: Side-port of the esophageal tube overlies the distal esophagus,
suggest further advancement for more optimal positioning

## 2018-10-06 IMAGING — DX DG ABDOMEN 1V
1 series · 1 of 1 positions shown · non-contrast
Comparison: December 22, 2017

CLINICAL DATA: Orogastric tube placement

EXAM:
ABDOMEN - 1 VIEW

[abdomen kub]
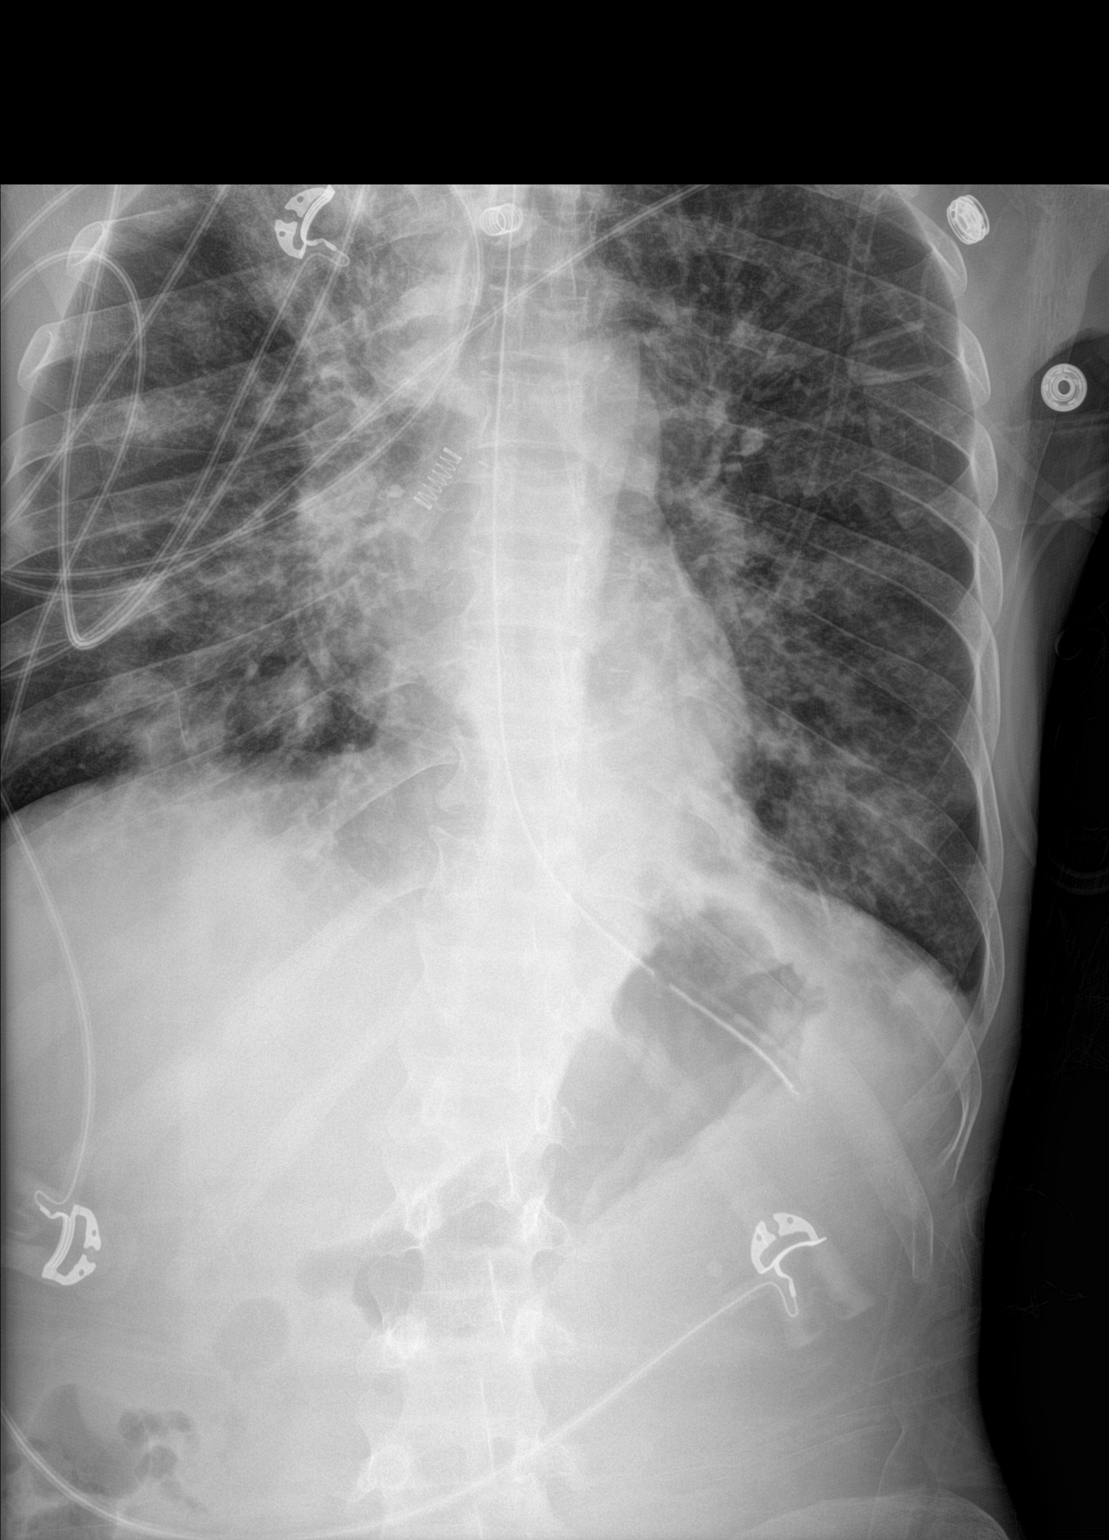

[1 of 1 positions shown; findings below may reference images not displayed]

FINDINGS: Orogastric tube tip and side port in proximal stomach. Visualized
bowel gas pattern is unremarkable. No evident bowel obstruction or
free air.

Endotracheal tube tip is 5.1 cm above the carina. No pneumothorax.
Multiple foci of airspace disease noted bilaterally.
IMPRESSION: Orogastric tube tip and side port in proximal stomach. Visualized
bowel gas pattern unremarkable. Multiple foci of airspace disease in
the lungs.

## 2018-10-09 IMAGING — DX DG CHEST 1V PORT
1 series · 1 of 1 positions shown · non-contrast
Comparison: 12/25/2017

CLINICAL DATA: Respiratory failure

EXAM:
PORTABLE CHEST 1 VIEW

[chest ap]
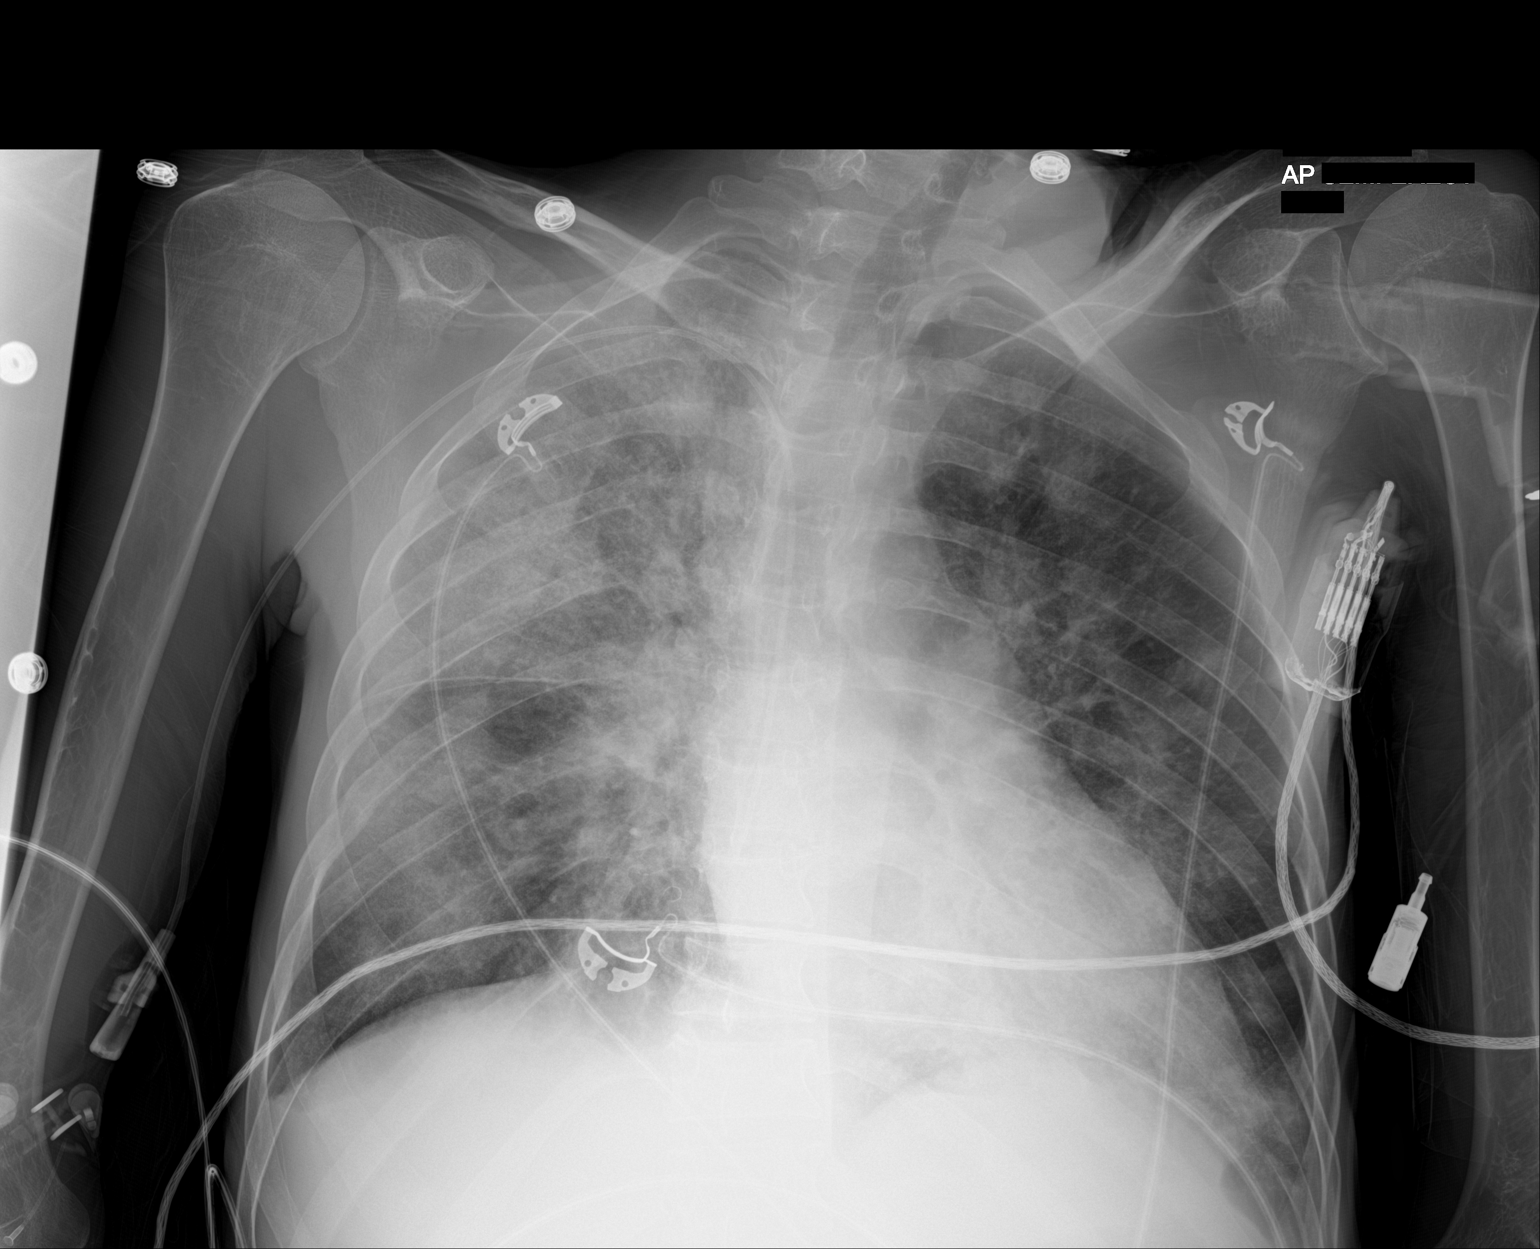

[1 of 1 positions shown; findings below may reference images not displayed]

FINDINGS: Cardiac shadow is stable. Right-sided PICC line is now noted with
catheter tip in the mid right atrium. Lungs are well aerated
bilaterally with diffuse bilateral infiltrate right greater than
left. The overall appearance is stable given some technical
variations in the film. No acute bony abnormality is seen. Old rib
fractures are noted on the right.
IMPRESSION: Persistent bilateral infiltrates.

PICC line as described.

## 2018-12-16 ENCOUNTER — Emergency Department: Payer: Medicaid Other

## 2018-12-16 ENCOUNTER — Emergency Department
Admission: EM | Admit: 2018-12-16 | Discharge: 2018-12-16 | Discharge status: Against Medical Advice | Payer: Medicaid Other | Attending: Emergency Medicine | Admitting: Emergency Medicine

## 2018-12-16 ENCOUNTER — Other Ambulatory Visit: Payer: Self-pay

## 2018-12-16 DIAGNOSIS — R739 Hyperglycemia, unspecified: Secondary | ICD-10-CM

## 2018-12-16 DIAGNOSIS — I10 Essential (primary) hypertension: Secondary | ICD-10-CM | POA: Diagnosis not present

## 2018-12-16 DIAGNOSIS — Z79899 Other long term (current) drug therapy: Secondary | ICD-10-CM | POA: Insufficient documentation

## 2018-12-16 DIAGNOSIS — F17210 Nicotine dependence, cigarettes, uncomplicated: Secondary | ICD-10-CM | POA: Insufficient documentation

## 2018-12-16 DIAGNOSIS — T507X1A Poisoning by analeptics and opioid receptor antagonists, accidental (unintentional), initial encounter: Secondary | ICD-10-CM | POA: Insufficient documentation

## 2018-12-16 DIAGNOSIS — Z794 Long term (current) use of insulin: Secondary | ICD-10-CM | POA: Diagnosis not present

## 2018-12-16 DIAGNOSIS — J189 Pneumonia, unspecified organism: Secondary | ICD-10-CM | POA: Insufficient documentation

## 2018-12-16 DIAGNOSIS — I959 Hypotension, unspecified: Secondary | ICD-10-CM

## 2018-12-16 DIAGNOSIS — E1165 Type 2 diabetes mellitus with hyperglycemia: Secondary | ICD-10-CM | POA: Diagnosis not present

## 2018-12-16 DIAGNOSIS — R0902 Hypoxemia: Secondary | ICD-10-CM

## 2018-12-16 DIAGNOSIS — T402X1A Poisoning by other opioids, accidental (unintentional), initial encounter: Secondary | ICD-10-CM

## 2018-12-16 DIAGNOSIS — J181 Lobar pneumonia, unspecified organism: Secondary | ICD-10-CM

## 2018-12-16 LAB — COMPREHENSIVE METABOLIC PANEL
ALT: 22 U/L (ref 0–44)
AST: 27 U/L (ref 15–41)
Albumin: 3.2 g/dL — ABNORMAL LOW (ref 3.5–5.0)
Alkaline Phosphatase: 113 U/L (ref 38–126)
Anion gap: 11 (ref 5–15)
BUN: 56 mg/dL — ABNORMAL HIGH (ref 6–20)
CO2: 28 mmol/L (ref 22–32)
Calcium: 8.8 mg/dL — ABNORMAL LOW (ref 8.9–10.3)
Chloride: 99 mmol/L (ref 98–111)
Creatinine, Ser: 1.63 mg/dL — ABNORMAL HIGH (ref 0.61–1.24)
GFR calc Af Amer: >60 mL/min (ref >60)
GFR calc non Af Amer: 55 mL/min — ABNORMAL LOW (ref >60)
Glucose, Bld: 242 mg/dL — ABNORMAL HIGH (ref 70–99)
Potassium: 5.1 mmol/L (ref 3.5–5.1)
Sodium: 138 mmol/L (ref 135–145)
Total Bilirubin: 0.7 mg/dL (ref 0.3–1.2)
Total Protein: 8 g/dL (ref 6.5–8.1)

## 2018-12-16 LAB — BLOOD GAS, VENOUS
Acid-Base Excess: 4.3 mmol/L — ABNORMAL HIGH (ref 0.0–2.0)
Bicarbonate: 31.5 mmol/L — ABNORMAL HIGH (ref 20.0–28.0)
O2 Saturation: 87.1 %
Patient temperature: 37
pCO2, Ven: 57 mmHg (ref 44.0–60.0)
pH, Ven: 7.35 (ref 7.250–7.430)
pO2, Ven: 56 mmHg — ABNORMAL HIGH (ref 32.0–45.0)

## 2018-12-16 LAB — CBC WITH DIFFERENTIAL/PLATELET
Abs Immature Granulocytes: 0.08 10*3/uL — ABNORMAL HIGH (ref 0.00–0.07)
Basophils Absolute: 0.1 10*3/uL (ref 0.0–0.1)
Basophils Relative: 1 %
Eosinophils Absolute: 0.1 10*3/uL (ref 0.0–0.5)
Eosinophils Relative: 1 %
HCT: 34.3 % — ABNORMAL LOW (ref 39.0–52.0)
Hemoglobin: 9.9 g/dL — ABNORMAL LOW (ref 13.0–17.0)
Immature Granulocytes: 1 %
Lymphocytes Relative: 11 %
Lymphs Abs: 1.5 10*3/uL (ref 0.7–4.0)
MCH: 23.5 pg — ABNORMAL LOW (ref 26.0–34.0)
MCHC: 28.9 g/dL — ABNORMAL LOW (ref 30.0–36.0)
MCV: 81.5 fL (ref 80.0–100.0)
Monocytes Absolute: 1.1 10*3/uL — ABNORMAL HIGH (ref 0.1–1.0)
Monocytes Relative: 8 %
Neutro Abs: 11.3 10*3/uL — ABNORMAL HIGH (ref 1.7–7.7)
Neutrophils Relative %: 78 %
Platelets: 568 10*3/uL — ABNORMAL HIGH (ref 150–400)
RBC: 4.21 MIL/uL — ABNORMAL LOW (ref 4.22–5.81)
RDW: 17.9 % — ABNORMAL HIGH (ref 11.5–15.5)
WBC: 14.2 10*3/uL — ABNORMAL HIGH (ref 4.0–10.5)
nRBC: 0 % (ref 0.0–0.2)

## 2018-12-16 LAB — PROCALCITONIN: Procalcitonin: 25.84 ng/mL

## 2018-12-16 LAB — LACTIC ACID, PLASMA: Lactic Acid, Venous: 0.9 mmol/L (ref 0.5–1.9)

## 2018-12-16 LAB — TROPONIN I: Troponin I: <0.03 ng/mL (ref <0.03)

## 2018-12-16 MED ORDER — SODIUM CHLORIDE 0.9 % IV SOLN
2.0000 g | Freq: Once | INTRAVENOUS | Status: AC
  Administered 2018-12-16: 2 g via INTRAVENOUS
  Filled 2018-12-16: qty 20

## 2018-12-16 MED ORDER — SODIUM CHLORIDE 0.9 % IV BOLUS
1000.0000 mL | Freq: Once | INTRAVENOUS | Status: AC
  Administered 2018-12-16: 1000 mL via INTRAVENOUS

## 2018-12-16 MED ORDER — SODIUM CHLORIDE 0.9 % IV SOLN: 500.0000 mg | Freq: Once | INTRAVENOUS | Status: DC

## 2018-12-16 MED ORDER — SODIUM CHLORIDE 0.9 % IV SOLN
500.0000 mg | Freq: Once | INTRAVENOUS | Status: AC
  Administered 2018-12-16: 19:00:00 500 mg via INTRAVENOUS
  Filled 2018-12-16: qty 500

## 2018-12-16 MED ORDER — LEVOFLOXACIN 750 MG PO TABS: 750.0000 mg | ORAL_TABLET | Freq: Every day | ORAL | 0 refills | Status: AC

## 2018-12-16 MED ORDER — SODIUM CHLORIDE 0.9 % IV SOLN: 2.0000 g | Freq: Once | INTRAVENOUS | Status: DC

## 2018-12-16 NOTE — ED Notes (Signed)
Pt stated that he did not want to stay and would rather go home. Pt stated that the last time that he stayed here he said "they couldn't do anything to help with my oxygen and I always desatted and should probably always be on oxygen." MD norman notified of patient not wanting to stay and MD norman stated that pt would have to sign out AMA. Pt okay to be discharged AMA.

## 2018-12-16 NOTE — ED Notes (Signed)
Pt desat to 87% with good waveform - placed on 2L O2 via n/c

## 2018-12-16 NOTE — ED Provider Notes (Addendum)
[facility] Emergency Department Provider Note  ____________________________________________  Time seen: Approximately 5:34 PM  I have reviewed the triage vital signs and the nursing notes.   HISTORY  Chief Complaint Drug Overdose and Cough    HPI Maurice Davidson is a 33 y.o. male history of DM, HTN, opioid abuse, presenting to the emergency department for unresponsiveness in the setting of overdose.  EMS was called because the patient was unresponsive.  He states that he overdosed on opioids, and was not trying to kill himself.  Upon arrival, EMS noted the patient to be completely unresponsive and he was given 2 mg of Narcan with immediate response.  His initial blood pressure was 80s over 60s.  He did desaturate to the 70s in route while denying any shortness of breath, and had to be placed on 15 L nonrebreather to maintain oxygen saturations in the 90s.  Blood sugar was 136.  Initially, the patient was refusing transport and the [employer] had to take out IVC paperwork to bring him here.  Upon arrival in the emergency department, his O2 sats are 96% on room air.  He reports that for the last several months he has had a productive cough.  He denies any fevers or chills.  He has not been compliant with social isolation recommendations during this pandemic.  Past Medical History:  Diagnosis Date  . Anemia   . Anxiety   . Aspiration pneumonia ([place])   . Chronic pain    take Suboxone  . Diabetes mellitus without complication ([place])   . Drug-seeking behavior   . Encephalopathy   . Gastroparesis   . H/O: GI bleed   . HTN (hypertension)   . MRSA (methicillin resistant staph aureus) culture positive   . Opioid dependence ([place])   . Renal disorder   . Seizures ([place])   . Sepsis [facility])     Patient Active Problem List   Diagnosis Date Noted  . Polysubstance abuse ([place]) 03/13/2018  . Drug-seeking behavior 03/13/2018  . Adjustment disorder with mixed anxiety and  depressed mood 12/30/2017  . Acute renal failure superimposed on stage 3 chronic kidney disease ([place]) 12/22/2017  . Depression, recurrent ([place]) 06/25/2017  . HTN (hypertension) 11/27/2015  . GI bleed 11/27/2015  . Hypokalemia 11/27/2015  . Protein-calorie malnutrition, severe ([place]) 06/03/2015  . History of sepsis 06/02/2015  . Stomach pain 10/20/2013  . Thrombocytosis ([place]) 10/10/2013  . Anxiety 10/10/2013  . Encephalopathy 10/07/2013  . Diabetes mellitus type 1 ([place]) 10/07/2013  . MRSA bacteremia 10/07/2013  . Seizures ([place]) 10/07/2013  . History of aspiration pneumonia 10/07/2013  . Anemia of chronic disease 10/07/2013    Past Surgical History:  Procedure Laterality Date  . ORTHOPEDIC SURGERY      Current Outpatient Rx  . Order #: [id number] Class: Historical Med  . Order #: [id number] Class: Normal  . Order #: [id number] Class: Historical Med  . Order #: [id number] Class: Historical Med  . Order #: [id number] Class: Print  . Order #: [id number] Class: Print  . Order #: [id number] Class: Normal  . Order #: [id number] Class: No Print  . Order #: [id number] Class: Historical Med  . Order #: [id number] Class: Print  . Order #: [id number] Class: Historical Med  . Order #: [id number] Class: Normal  . Order #: [id number] Class: Normal  . Order #: [id number] Class: Normal  . Order #: [id number] Class: Normal  . Order #: [id number] Class: Historical Med    Allergies Aripiprazole and Benzodiazepines  Family History  Problem Relation Age of Onset  . Clotting disorder  Sister        Von Willebrand's disease plus polycythemia vera  . Hypertension Mother   . Diabetes Maternal Grandfather   . Thyroid disease Neg Hx     Social History Social History   Tobacco Use  . Smoking status: Current Some Day Smoker    Packs/day: 1.00    Types: Cigarettes  . Smokeless tobacco: Never Used  Substance Use Topics  . Alcohol use: No    Alcohol/week: 0.0 standard drinks  . Drug use: Yes    Comment: marijuana,  cocaine    Review of Systems Constitutional: No fever/chills.  Positive unresponsiveness in the setting of overdose. Eyes: No visual changes. ENT: No sore throat. No congestion or rhinorrhea. Cardiovascular: Denies chest pain. Denies palpitations. Respiratory: Denies shortness of breath.  Positive cough. Gastrointestinal: No abdominal pain.  No nausea, no vomiting.  No diarrhea.  No constipation. Genitourinary: Negative for dysuria. Musculoskeletal: Negative for back pain.  No lower extremity swelling or calf pain. Skin: Negative for rash. Neurological: Negative for headaches. No focal numbness, tingling or weakness.  Psychiatric:Opioid overdose.  Denies any SI, HI or hallucinations.  ____________________________________________   PHYSICAL EXAM:  VITAL SIGNS: ED Triage Vitals [12/16/18 1730]  Enc Vitals Group     BP (!) 145/116     Pulse Rate (!) 108     Resp (!) 42     Temp 98.9 F (37.2 C)     Temp Source Oral     SpO2 92 %     Weight 110 lb (49.9 kg)     Height 5\' 5"  (1.651 m)     Head Circumference      Peak Flow      Pain Score 6     Pain Loc      Pain Edu?      Excl. in [place]?     Constitutional: She is alert and oriented and answers questions appropriately.  GCS is 15.  He is chronically ill-appearing with cachexia. Eyes: Conjunctivae are normal.  EOMI. No scleral icterus. Head: Atraumatic. Nose: No congestion/rhinnorhea. Mouth/Throat: Mucous membranes are dry.  Neck: No stridor.  Supple.  No JVD.  No meningismus. Cardiovascular: Fast rate, regular rhythm. No murmurs, rubs or gallops.  Respiratory: Normal respiratory effort.  No accessory muscle use or retractions.  The patient has rhonchi in the lungs diffusely more prominently in the bases bilaterally. Gastrointestinal: Soft, nontender and nondistended.  No guarding or rebound.  No peritoneal signs. Musculoskeletal: No LE edema. No ttp in the calves or palpable cords.  Negative Homan's sign. Neurologic:   A&Ox3.  Speech is clear.  Face and smile are symmetric.  EOMI.  Moves all extremities well. Skin:  Skin is warm, dry and intact. No rash noted. Psychiatric: Mood and affect are normal.   ____________________________________________   LABS (all labs ordered are listed, but only abnormal results are displayed)  Labs Reviewed  COMPREHENSIVE METABOLIC PANEL - Abnormal; Notable for the following components:      Result Value   Glucose, Bld 242 (*)    BUN 56 (*)    Creatinine, Ser 1.63 (*)    Calcium 8.8 (*)    Albumin 3.2 (*)    GFR calc non Af Amer 55 (*)    All other components within normal limits  CBC WITH DIFFERENTIAL/PLATELET - Abnormal; Notable for the following components:   WBC 14.2 (*)    RBC 4.21 (*)    Hemoglobin 9.9 (*)    HCT 34.3 (*)  MCH 23.5 (*)    MCHC 28.9 (*)    RDW 17.9 (*)    Platelets 568 (*)    Neutro Abs 11.3 (*)    Monocytes Absolute 1.1 (*)    Abs Immature Granulocytes 0.08 (*)    All other components within normal limits  BLOOD GAS, VENOUS - Abnormal; Notable for the following components:   pO2, Ven 56.0 (*)    Bicarbonate 31.5 (*)    Acid-Base Excess 4.3 (*)    All other components within normal limits  CULTURE, BLOOD (ROUTINE X 2)  CULTURE, BLOOD (ROUTINE X 2)  LACTIC ACID, PLASMA  PROCALCITONIN  TROPONIN I  LACTIC ACID, PLASMA  URINALYSIS, ROUTINE W REFLEX MICROSCOPIC   ____________________________________________  EKG  ED ECG REPORT I, Anne-Caroline Mariea Clonts, the attending physician, personally viewed and interpreted this ECG.   Date: 12/16/2018  EKG Time: 1728  Rate: 104  Rhythm: sinus tachycardia  Axis: normal  Intervals:none  ST&T Change: No STEMI  ____________________________________________  RADIOLOGY  Dg Chest Portable 1 View  Result Date: 12/16/2018 CLINICAL DATA:  Drug overdose. EXAM: PORTABLE CHEST 1 VIEW COMPARISON:  Chest x-ray dated March 04, 2018. FINDINGS: The heart size and mediastinal contours are within normal  limits. Normal pulmonary vascularity. Slightly increased patchy opacity at the left lung base. No focal consolidation, pleural effusion, or pneumothorax. No acute osseous abnormality. IMPRESSION: 1. Slightly increased patchy opacity at the left lung base could reflect atelectasis or developing infiltrate. Electronically Signed   By: Titus Dubin M.D.   On: 12/16/2018 18:01    ____________________________________________   PROCEDURES  Procedure(s) performed: None  Procedures  Critical Care performed: Yes, see critical care note(s) ____________________________________________   INITIAL IMPRESSION / ASSESSMENT AND PLAN / ED COURSE  Pertinent labs & imaging results that were available during my care of the patient were reviewed by me and considered in my medical decision making (see chart for details).  33 y.o. with a history of diabetes and opioid abuse presenting for overdose.  Overall, at this time, the patient has normal mental status.  His initial symptoms were likely due to overdose, including his hypotension.  However, in the setting of the pandemic with his cough and abnormal pulmonary examination, I am concerned about a bacterial or viral pneumonia, including coronavirus.  The patient will undergo complete evaluation including chest x-ray, basic laboratory studies, he will receive immediate empiric antibiotics for community-acquired pneumonia.  Plan reevaluation for final disposition.  ----------------------------------------- 6:57 PM on 12/16/2018 -----------------------------------------  The patient does have an infiltrate in the Left lung base and has been covered with Rocephin and Azithro.  Procalcitonin level is elevated which is consistent with a bacterial process.  He has an elevated white blood cell count of 14.2 and no lymphopenia.  In addition, his LFTs are reassuring.  While I cannot say for sure, these findings are more consistent with a bacterial pneumonia than  coronavirus.  However, I will treat him for bacterial pneumonia and he will receive instructions to isolate per coronavirus precautions.  However, his lactic acid is normal, and given the length of time he has been having symptoms, he likely has had an infection for several weeks.  His venous blood gas is reassuring.  Patient is hyperglycemic with a blood sugar in the 200s but not in DKA; his anion gap is 11.  In addition, he has a creatinine today of 1.63, which is on the high end of his normal baseline.  Receiving intravenous fluids for this.  There was a report of hypotension and hypoxia.  Here, the patient briefly fell asleep and did drop his sats to the low 90s so was placed on oxygen but I woken him up, and he has normal oxygen saturations that are greater than 96%.  I will have the nurse ambulate him to evaluate for ambulatory pulse oximetry.  ----------------------------------------- 7:09 PM on 12/16/2018 -----------------------------------------  I walked out of the room and within 6 to 7 minutes, even without any ambulation, and with a good tracing, the patient's O2 sats are between 82 and 83% on room air.  The patient has been admitted to the hospitalist.  We did discuss coronavirus testing but with multiple features pointing towards a bacterial infection, it has been deferred at this time.  ----------------------------------------- 7:24 PM on 12/16/2018 -----------------------------------------  Despite his significant hypoxia, the patient is refusing admission at this time.  He is not making a good choice, but is able to understand the risks, including deterioration, endorgan disease, and mortality, of his decision.  He is leaving [place].  I will discharge him with a prescription for Levaquin.  I have instructed him to follow-up with his PMD and return to the emergency department if he changes his mind.  CRITICAL CARE Performed by: Eula Listen   Total  critical care time: 35 minutes  Critical care time was exclusive of separately billable procedures and treating other patients.  Critical care was necessary to treat or prevent imminent or life-threatening deterioration.  Critical care was time spent personally by me on the following activities: development of treatment plan with patient and/or surrogate as well as nursing, discussions with consultants, evaluation of patient's response to treatment, examination of patient, obtaining history from patient or surrogate, ordering and performing treatments and interventions, ordering and review of laboratory studies, ordering and review of radiographic studies, pulse oximetry and re-evaluation of patient's condition.   ____________________________________________  FINAL CLINICAL IMPRESSION(S) / ED DIAGNOSES  Final diagnoses:  Opioid overdose, accidental or unintentional, initial encounter ([place])  Pneumonia of left lower lobe due to infectious organism ([place])  Hypoxia  Hypotension, unspecified hypotension type  Hyperglycemia         NEW MEDICATIONS STARTED DURING THIS VISIT:  New Prescriptions   LEVOFLOXACIN (LEVAQUIN) 750 MG TABLET    Take 1 tablet (750 mg total) by mouth daily for 7 days.      Eula Listen, MD 12/16/18 1859    Eula Listen, MD 12/16/18 1910    Eula Listen, MD 12/16/18 1925

## 2018-12-16 NOTE — ED Notes (Signed)
Provider rescinded IVC per Dr Mariea Clonts

## 2018-12-16 NOTE — ED Triage Notes (Addendum)
Pt arrived via ems for report of overdose - he had Oxy, Trazodone, xanax, klonopin at bedside - pt had been out with friends and returned hme and "fell unconscious" - EMS arrived and gave Narcan with response - in route O2 sat 78% on 4L - increased O2 to 15L non-rebreather sat 43% [employer] IVC'd

## 2018-12-16 NOTE — ED Notes (Signed)
Pt seen leaving ED with paperwork after signing out AMA. Pt educated that he should quarantine himself in his household and to make sure that he finishes his antibiotics. Pt states understanding and verbalizes that he would come back if felt more [employer] or started having any other issues.

## 2018-12-16 NOTE — Consult Note (Signed)
CODE SEPSIS - PHARMACY COMMUNICATION  **Broad Spectrum Antibiotics should be administered within 1 hour of Sepsis diagnosis**  Time Code Sepsis Called/Page Received: 1733  Antibiotics Ordered: Azithromycin 500 mg  and CTX 2g  Time of 1st antibiotic administration: CTX at 1806  Additional action taken by pharmacy: Messaged Galen Daft, RN a reminder of code sepsis for this patient. The antibiotic was shortly given that the time.     Rowland Lathe ,PharmD Clinical Pharmacist  12/16/2018  6:10 PM

## 2018-12-16 NOTE — ED Notes (Signed)
O2 sat improved to 95% on 2L O2 via n/c

## 2018-12-16 NOTE — Discharge Instructions (Addendum)
Today you have chosen to leave against adequate advice, even with a low oxygen level.  If your oxygen levels are low, you can have permanent damage to all of your organs, including your brain, heart and kidneys.  In addition, it can stop your heart and you can die from this.   Person Under Monitoring Name: Maurice Davidson  Location: [address] [place] [address]   Infection Prevention Recommendations for Individuals Confirmed to have, or Being Evaluated for, 2019 Novel Coronavirus (COVID-19) Infection Who Receive Care at Home  Individuals who are confirmed to have, or are being evaluated for, COVID-19 should follow the prevention steps below until a healthcare provider or local or state health department says they can return to normal activities.  Stay home except to get medical care You should restrict activities outside your home, except for getting medical care. Do not go to work, school, or public areas, and do not use public transportation or taxis.  Call ahead before visiting your doctor Before your medical appointment, call the healthcare provider and tell them that you have, or are being evaluated for, COVID-19 infection. This will help the healthcare providers office take steps to keep other people from getting infected. Ask your healthcare provider to call the local or state health department.  Monitor your symptoms Seek prompt medical attention if your illness is worsening (e.g., difficulty breathing). Before going to your medical appointment, call the healthcare provider and tell them that you have, or are being evaluated for, COVID-19 infection. Ask your healthcare provider to call the local or state health department.  Wear a facemask You should wear a facemask that covers your nose and mouth when you are in the same room with other people and when you visit a healthcare provider. People who live with or visit you should also wear a facemask while they are  in the same room with you.  Separate yourself from other people in your home As much as possible, you should stay in a different room from other people in your home. Also, you should use a separate bathroom, if available.  Avoid sharing household items You should not share dishes, drinking glasses, cups, eating utensils, towels, bedding, or other items with other people in your home. After using these items, you should wash them thoroughly with soap and water.  Cover your coughs and sneezes Cover your mouth and nose with a tissue when you cough or sneeze, or you can cough or sneeze into your sleeve. Throw used tissues in a lined trash can, and immediately wash your hands with soap and water for at least 20 seconds or use an alcohol-based hand rub.  Wash your [facility] your hands often and thoroughly with soap and water for at least 20 seconds. You can use an alcohol-based hand sanitizer if soap and water are not available and if your hands are not visibly dirty. Avoid touching your eyes, nose, and mouth with unwashed hands.   Prevention Steps for Caregivers and Household Members of Individuals Confirmed to have, or Being Evaluated for, COVID-19 Infection Being Cared for in the Home  If you live with, or provide care at home for, a person confirmed to have, or being evaluated for, COVID-19 infection please follow these guidelines to prevent infection:  Follow healthcare providers instructions Make sure that you understand and can help the patient follow any healthcare provider instructions for all care.  Provide for the patients basic needs You should help the patient with basic  needs in the home and provide support for getting groceries, prescriptions, and other personal needs.  Monitor the patients symptoms If they are getting sicker, call his or her medical provider and tell them that the patient has, or is being evaluated for, COVID-19 infection. This will help the  healthcare providers office take steps to keep other people from getting infected. Ask the healthcare provider to call the local or state health department.  Limit the number of people who have contact with the patient If possible, have only one caregiver for the patient. Other household members should stay in another home or place of residence. If this is not possible, they should stay in another room, or be separated from the patient as much as possible. Use a separate bathroom, if available. Restrict visitors who do not have an essential need to be in the home.  Keep older adults, very young children, and other sick people away from the patient Keep older adults, very young children, and those who have compromised immune systems or chronic health conditions away from the patient. This includes people with chronic heart, lung, or kidney conditions, diabetes, and cancer.  Ensure good ventilation Make sure that shared spaces in the home have good air flow, such as from an air conditioner or an opened window, weather permitting.  Wash your hands often Wash your hands often and thoroughly with soap and water for at least 20 seconds. You can use an alcohol based hand sanitizer if soap and water are not available and if your hands are not visibly dirty. Avoid touching your eyes, nose, and mouth with unwashed hands. Use disposable paper towels to dry your hands. If not available, use dedicated cloth towels and replace them when they become wet.  Wear a facemask and gloves Wear a disposable facemask at all times in the room and gloves when you touch or have contact with the patients blood, body fluids, and/or secretions or excretions, such as sweat, saliva, sputum, nasal mucus, vomit, urine, or feces.  Ensure the mask fits over your nose and mouth tightly, and do not touch it during use. Throw out disposable facemasks and gloves after using them. Do not reuse. Wash your hands immediately after  removing your facemask and gloves. If your personal clothing becomes contaminated, carefully remove clothing and launder. Wash your hands after handling contaminated clothing. Place all used disposable facemasks, gloves, and other waste in a lined container before disposing them with other household waste. Remove gloves and wash your hands immediately after handling these items.  Do not share dishes, glasses, or other household items with the patient Avoid sharing household items. You should not share dishes, drinking glasses, cups, eating utensils, towels, bedding, or other items with a patient who is confirmed to have, or being evaluated for, COVID-19 infection. After the person uses these items, you should wash them thoroughly with soap and water.  Wash laundry thoroughly Immediately remove and wash clothes or bedding that have blood, body fluids, and/or secretions or excretions, such as sweat, saliva, sputum, nasal mucus, vomit, urine, or feces, on them. Wear gloves when handling laundry from the patient. Read and follow directions on labels of laundry or clothing items and detergent. In general, wash and dry with the warmest temperatures recommended on the label.  Clean all areas the individual has used often Clean all touchable surfaces, such as counters, tabletops, doorknobs, bathroom fixtures, toilets, phones, keyboards, tablets, and bedside tables, every day. Also, clean any surfaces that may have blood,  body fluids, and/or secretions or excretions on them. Wear gloves when cleaning surfaces the patient has come in contact with. Use a diluted bleach solution (e.g., dilute bleach with 1 part bleach and 10 parts water) or a household disinfectant with a label that says EPA-registered for coronaviruses. To make a bleach solution at home, add 1 tablespoon of bleach to 1 quart (4 cups) of water. For a larger supply, add  cup of bleach to 1 gallon (16 cups) of water. Read labels of cleaning  products and follow recommendations provided on product labels. Labels contain instructions for safe and effective use of the cleaning product including precautions you should take when applying the product, such as wearing gloves or eye protection and making sure you have good ventilation during use of the product. Remove gloves and wash hands immediately after cleaning.  Monitor yourself for signs and symptoms of illness Caregivers and household members are considered close contacts, should monitor their health, and will be asked to limit movement outside of the home to the extent possible. Follow the monitoring steps for close contacts listed on the symptom monitoring form.   ? If you have additional questions, contact your local health department or call the epidemiologist on call at [phone] (available 24/7). ? This guidance is subject to change. For the most up-to-date guidance from [facility], please refer to their website: [email or web address]

## 2018-12-21 LAB — CULTURE, BLOOD (ROUTINE X 2)
Culture: NO GROWTH
Culture: NO GROWTH
Special Requests: ADEQUATE
Special Requests: ADEQUATE

## 2018-12-31 ENCOUNTER — Other Ambulatory Visit: Payer: Self-pay

## 2018-12-31 ENCOUNTER — Encounter: Payer: Self-pay | Admitting: Emergency Medicine

## 2018-12-31 ENCOUNTER — Emergency Department
Admission: EM | Admit: 2018-12-31 | Discharge: 2018-12-31 | Disposition: A | Discharge status: Home / Self Care | Payer: Medicaid Other | Attending: Student in an Organized Health Care Education/Training Program | Admitting: Student in an Organized Health Care Education/Training Program

## 2018-12-31 DIAGNOSIS — F17210 Nicotine dependence, cigarettes, uncomplicated: Secondary | ICD-10-CM | POA: Insufficient documentation

## 2018-12-31 DIAGNOSIS — E1022 Type 1 diabetes mellitus with diabetic chronic kidney disease: Secondary | ICD-10-CM | POA: Diagnosis not present

## 2018-12-31 DIAGNOSIS — Z79899 Other long term (current) drug therapy: Secondary | ICD-10-CM | POA: Insufficient documentation

## 2018-12-31 DIAGNOSIS — Z794 Long term (current) use of insulin: Secondary | ICD-10-CM | POA: Diagnosis not present

## 2018-12-31 DIAGNOSIS — T401X1A Poisoning by heroin, accidental (unintentional), initial encounter: Secondary | ICD-10-CM | POA: Insufficient documentation

## 2018-12-31 DIAGNOSIS — I129 Hypertensive chronic kidney disease with stage 1 through stage 4 chronic kidney disease, or unspecified chronic kidney disease: Secondary | ICD-10-CM | POA: Insufficient documentation

## 2018-12-31 DIAGNOSIS — R000 Tachycardia, unspecified: Secondary | ICD-10-CM | POA: Diagnosis present

## 2018-12-31 DIAGNOSIS — N183 Chronic kidney disease, stage 3 (moderate): Secondary | ICD-10-CM | POA: Diagnosis not present

## 2018-12-31 LAB — CBC WITH DIFFERENTIAL/PLATELET
Abs Immature Granulocytes: 0.1 10*3/uL — ABNORMAL HIGH (ref 0.00–0.07)
Basophils Absolute: 0.1 10*3/uL (ref 0.0–0.1)
Basophils Relative: 1 %
Eosinophils Absolute: 0.2 10*3/uL (ref 0.0–0.5)
Eosinophils Relative: 1 %
HCT: 36.7 % — ABNORMAL LOW (ref 39.0–52.0)
Hemoglobin: 10.5 g/dL — ABNORMAL LOW (ref 13.0–17.0)
Immature Granulocytes: 1 %
Lymphocytes Relative: 21 %
Lymphs Abs: 3 10*3/uL (ref 0.7–4.0)
MCH: 23.6 pg — ABNORMAL LOW (ref 26.0–34.0)
MCHC: 28.6 g/dL — ABNORMAL LOW (ref 30.0–36.0)
MCV: 82.7 fL (ref 80.0–100.0)
Monocytes Absolute: 1.4 10*3/uL — ABNORMAL HIGH (ref 0.1–1.0)
Monocytes Relative: 10 %
Neutro Abs: 9.3 10*3/uL — ABNORMAL HIGH (ref 1.7–7.7)
Neutrophils Relative %: 66 %
Platelets: 640 10*3/uL — ABNORMAL HIGH (ref 150–400)
RBC: 4.44 MIL/uL (ref 4.22–5.81)
RDW: 19.3 % — ABNORMAL HIGH (ref 11.5–15.5)
WBC: 14.1 10*3/uL — ABNORMAL HIGH (ref 4.0–10.5)
nRBC: 0 % (ref 0.0–0.2)

## 2018-12-31 LAB — BASIC METABOLIC PANEL
Anion gap: 11 (ref 5–15)
BUN: 38 mg/dL — ABNORMAL HIGH (ref 6–20)
CO2: 22 mmol/L (ref 22–32)
Calcium: 9 mg/dL (ref 8.9–10.3)
Chloride: 107 mmol/L (ref 98–111)
Creatinine, Ser: 1.5 mg/dL — ABNORMAL HIGH (ref 0.61–1.24)
GFR calc Af Amer: >60 mL/min (ref >60)
GFR calc non Af Amer: >60 mL/min (ref >60)
Glucose, Bld: 172 mg/dL — ABNORMAL HIGH (ref 70–99)
Potassium: 5 mmol/L (ref 3.5–5.1)
Sodium: 140 mmol/L (ref 135–145)

## 2018-12-31 MED ORDER — SODIUM CHLORIDE 0.9 % IV BOLUS
1000.0000 mL | Freq: Once | INTRAVENOUS | Status: AC
  Administered 2018-12-31: 1000 mL via INTRAVENOUS

## 2018-12-31 MED ORDER — NALOXONE HCL 4 MG/0.1ML NA LIQD
1.0000 | Freq: Once | NASAL | Status: AC
  Administered 2018-12-31: 1 via NASAL
  Filled 2018-12-31: qty 4

## 2018-12-31 NOTE — ED Triage Notes (Signed)
Patient arrives via [facility]. Per EMS, patient was found unresponsive in his car in a parking lot. Patient breathing 8 times a minute for EMS. Patient was given 0.5 mg of narcan by ems with increased responsiveness. Patient states he"shot up 2 10 mg oxycodone". Needle was found on scene. Patient denies SI. Alert and cooperative upon arrival.

## 2018-12-31 NOTE — ED Provider Notes (Signed)
[facility] Emergency Department Provider Note    First MD Initiated Contact with Patient 12/31/18 1152     (approximate)  I have reviewed the triage vital signs and the nursing notes.   HISTORY  Chief Complaint Drug Overdose    HPI Maurice Davidson is a 33 y.o. male below listed past medical history presents the ER after bystanders at a local bar sister found the patient unresponsive parking lot.  EMS versus moderate found the patient normal glucose found him to be very dyspneic and unresponsive.  He was given Narcan with improvement in symptoms.  Patient arrives to the ER stating "it was a mistake ".  States he did not intend to overdose.  Has a long history of substance abuse and narcotic dependence.  Requesting something to drink.  Mildly tachycardic.  States he currently feels like he is withdrawing.  Mild pain.    Past Medical History:  Diagnosis Date  . Anemia   . Anxiety   . Aspiration pneumonia ([place])   . Chronic pain    take Suboxone  . Diabetes mellitus without complication ([place])   . Drug-seeking behavior   . Encephalopathy   . Gastroparesis   . H/O: GI bleed   . HTN (hypertension)   . MRSA (methicillin resistant staph aureus) culture positive   . Opioid dependence ([place])   . Renal disorder   . Seizures ([place])   . Sepsis ([place])    Family History  Problem Relation Age of Onset  . Clotting disorder Sister        Von Willebrand's disease plus polycythemia vera  . Hypertension Mother   . Diabetes Maternal Grandfather   . Thyroid disease Neg Hx    Past Surgical History:  Procedure Laterality Date  . ORTHOPEDIC SURGERY     Patient Active Problem List   Diagnosis Date Noted  . Polysubstance abuse ([place]) 03/13/2018  . Drug-seeking behavior 03/13/2018  . Adjustment disorder with mixed anxiety and depressed mood 12/30/2017  . Acute renal failure superimposed on stage 3 chronic kidney disease ([place]) 12/22/2017  . Depression, recurrent ([place])  06/25/2017  . HTN (hypertension) 11/27/2015  . GI bleed 11/27/2015  . Hypokalemia 11/27/2015  . Protein-calorie malnutrition, severe ([place]) 06/03/2015  . History of sepsis 06/02/2015  . Stomach pain 10/20/2013  . Thrombocytosis ([place]) 10/10/2013  . Anxiety 10/10/2013  . Encephalopathy 10/07/2013  . Diabetes mellitus type 1 ([place]) 10/07/2013  . MRSA bacteremia 10/07/2013  . Seizures ([place]) 10/07/2013  . History of aspiration pneumonia 10/07/2013  . Anemia of chronic disease 10/07/2013      Prior to Admission medications   Medication Sig Start Date End Date Taking? Authorizing Provider  acetaminophen (TYLENOL) 500 MG tablet Take 500 mg by mouth every 6 (six) hours as needed for mild pain.     [provider]  albuterol (PROVENTIL HFA;VENTOLIN HFA) 108 (90 Base) MCG/ACT inhaler Inhale 2 puffs into the lungs every 6 (six) hours as needed for wheezing or shortness of breath. 02/27/18   Carmon Ginsberg, PA  ALPRAZolam Duanne Moron) 1 MG tablet Take 1 mg by mouth at bedtime as needed for anxiety.    [provider]  cariprazine (VRAYLAR) capsule Take 3 mg by mouth daily.    [provider]  ferrous sulfate 325 (65 FE) MG tablet Take 1 tablet (325 mg total) by mouth daily with breakfast. 01/01/18   Loletha Grayer, MD  gabapentin (NEURONTIN) 400 MG capsule Take 2 capsules (800 mg total) by  mouth 3 (three) times daily. 12/31/17   Loletha Grayer, MD  glucagon (GLUCAGON EMERGENCY) 1 MG injection Inject 1 mg into the muscle once as needed. 03/13/18   Trinna Post, PA-C  insulin aspart (NOVOLOG) 100 UNIT/ML injection 2 units prior to meals plus sliding scale depending on sugar 12/31/17   Loletha Grayer, MD  insulin glargine (LANTUS) 100 UNIT/ML injection Inject 10 Units into the skin at bedtime.     [provider]  Multiple Vitamins-Minerals (MULTIVITAMIN WITH MINERALS) tablet Take 1 tablet by mouth daily.    [provider]  pantoprazole (PROTONIX) 40 MG  tablet Take 1 tablet (40 mg total) by mouth daily. 03/13/18   Trinna Post, PA-C  PARoxetine (PAXIL) 20 MG tablet Take 1 tablet (20 mg total) by mouth daily. 02/20/18   Trinna Post, PA-C  SYMBICORT 160-4.5 MCG/ACT inhaler TAKE 2 PUFFS BY MOUTH TWICE A DAY 04/17/18   Carles Collet M, PA-C  traZODone (DESYREL) 50 MG tablet Take 3 tablets (150 mg total) by mouth at bedtime. 10/21/17 01/19/18  Trinna Post, PA-C  zolpidem (AMBIEN) 10 MG tablet Take 10 mg by mouth at bedtime. 02/20/18   [provider]    Allergies Aripiprazole and Benzodiazepines    Social History Social History   Tobacco Use  . Smoking status: Current Some Day Smoker    Packs/day: 1.00    Types: Cigarettes  . Smokeless tobacco: Never Used  Substance Use Topics  . Alcohol use: No    Alcohol/week: 0.0 standard drinks  . Drug use: Yes    Comment: marijuana, cocaine    Review of Systems Patient denies headaches, rhinorrhea, blurry vision, numbness, shortness of breath, chest pain, edema, cough, abdominal pain, nausea, vomiting, diarrhea, dysuria, fevers, rashes or hallucinations unless otherwise stated above in HPI. ____________________________________________   PHYSICAL EXAM:  VITAL SIGNS: Vitals:   12/31/18 1152  BP: (!) 155/94  Pulse: (!) 122  Resp: 18  Temp: 97.9 F (36.6 C)  SpO2: 97%    Constitutional: Alert and oriented. Frail appearing Eyes: Conjunctivae are normal.  Head: Atraumatic. Nose: No congestion/rhinnorhea. Mouth/Throat: Mucous membranes are moist.   Neck: No stridor. Painless ROM.  Cardiovascular: tachycardic rate, regular rhythm. Grossly normal heart sounds.  Good peripheral circulation. Respiratory: Normal respiratory effort.  No retractions. Lungs CTAB. Gastrointestinal: Soft and nontender. No distention. No abdominal bruits. No CVA tenderness. Genitourinary: deferred Musculoskeletal: No lower extremity tenderness nor edema.  No joint effusions. Neurologic:  Normal  speech and language. No gross focal neurologic deficits are appreciated. No facial droop Skin:  Skin is warm, dry and intact. No rash noted. Psychiatric: Mood and affect are normal. Speech and behavior are normal.  ____________________________________________   LABS (all labs ordered are listed, but only abnormal results are displayed)  No results found for this or any previous visit (from the past 24 hour(s)). ____________________________________________ ____________________________________________  [id number]   ____________________________________________   PROCEDURES  Procedure(s) performed:  Procedures    Critical Care performed: no ____________________________________________   INITIAL IMPRESSION / ASSESSMENT AND PLAN / ED COURSE  Pertinent labs & imaging results that were available during my care of the patient were reviewed by me and considered in my medical decision making (see chart for details).   DDX: verdose, substance abuse, electrolyte abn, anemia, dka  LORENCE NAGENGAST is a 33 y.o. who presents to the ED with symptoms as described above with accidental overdose.  Will check blood work placed on cardiac monitor and observed.  Patient mildly tachycardic and does appear frail will give IV fluids.  Clinical Course as of Dec 31 1255  Thu Dec 31, 2018  1229 Blood work roughly at baseline with chronic leukocytosis.  Patient requesting something to eat and drink.  He is currently hemodynamically stable.  No hypoxia.   [PR]    Clinical Course User Index [PR] Merlyn Lot, MD    The patient was evaluated in Emergency Department today for the symptoms described in the history of present illness. He/she was evaluated in the context of the global COVID-19 pandemic, which necessitated consideration that the patient might be at risk for infection with the SARS-CoV-2 virus that causes COVID-19. Institutional protocols and algorithms that pertain to the evaluation of  patients at risk for COVID-19 are in a state of rapid change based on information released by regulatory bodies including the CDC and federal and state organizations. These policies and algorithms were followed during the patient's care in the ED.  As part of my medical decision making, I reviewed the following data within the [place] notes reviewed and incorporated, Labs reviewed, notes from prior ED visits and [place] Controlled Substance Database   ____________________________________________   FINAL CLINICAL IMPRESSION(S) / ED DIAGNOSES  Final diagnoses:  Accidental overdose of heroin, initial encounter ([place])      NEW MEDICATIONS STARTED DURING THIS VISIT:  New Prescriptions   No medications on file     Note:  This document was prepared using Dragon voice recognition software and may include unintentional dictation errors.    Merlyn Lot, MD 12/31/18 1258

## 2019-02-05 ENCOUNTER — Telehealth: Payer: Self-pay

## 2019-02-05 NOTE — Telephone Encounter (Signed)
Absolutely not he needs to go the ER. Very complex history of uncontrolled Type I diabetes and drug abuse. NEEDS TO GO TO THE ER.

## 2019-02-05 NOTE — Telephone Encounter (Signed)
Patient's mother Suanne Marker advised.

## 2019-02-05 NOTE — Telephone Encounter (Signed)
Patient's mom Suanne Marker is calling saying that the patient has not felt well the last 4-5 days. She reports that he is having trouble with his right hand. He can not grip something without dropping it. He has also "fell out" twice in the last 2 days. She feels that his blood sugar might have gotten too low. She mentions that he appears "stiff" and just "out of it". She reports that 2 days ago, they called EMS and the patient refused to go to the hospital. I also recommended that he go to the ER to be evaluated, but the mother reports that the patient declined. She is wanting him to be seen in the office. Please review for recommendations. Thanks!  [phone].

## 2019-02-11 ENCOUNTER — Other Ambulatory Visit: Payer: Self-pay | Admitting: Physician Assistant

## 2019-02-11 DIAGNOSIS — Z720 Tobacco use: Secondary | ICD-10-CM

## 2019-02-12 NOTE — Telephone Encounter (Signed)
Please review

## 2019-02-24 ENCOUNTER — Telehealth: Payer: Self-pay | Admitting: Family Medicine

## 2019-02-24 NOTE — Telephone Encounter (Signed)
Can make virtual visit. Needs video. If not able to, can come to clinic if he doesn't have symptoms.

## 2019-02-24 NOTE — Telephone Encounter (Signed)
Pt called saying his right hand is swollen and he would like to be seen.  He also needs some refills on medication.  He was seeing Fabio Bering but started seeing Mikki Santee.    Can I make an appt with Adriana for him.  Please advise  His call back is [phone]  thanks teri

## 2019-02-24 NOTE — Telephone Encounter (Signed)
I scheduled him an appt for tomorrow.  Possible bug bite.  He has no other symptoms.  He does not have ability to do a virtual appt.  Thanks  [employer]

## 2019-02-25 ENCOUNTER — Other Ambulatory Visit: Payer: Self-pay

## 2019-02-25 ENCOUNTER — Ambulatory Visit: Payer: Medicaid Other | Admitting: Physician Assistant

## 2019-02-25 ENCOUNTER — Encounter: Payer: Self-pay | Admitting: Physician Assistant

## 2019-02-25 VITALS — BP 130/80 | HR 95 | Temp 98.7°F | Resp 16 | Ht 65.0 in | Wt 107.0 lb

## 2019-02-25 DIAGNOSIS — F319 Bipolar disorder, unspecified: Secondary | ICD-10-CM

## 2019-02-25 DIAGNOSIS — E1029 Type 1 diabetes mellitus with other diabetic kidney complication: Secondary | ICD-10-CM | POA: Diagnosis not present

## 2019-02-25 DIAGNOSIS — G4700 Insomnia, unspecified: Secondary | ICD-10-CM

## 2019-02-25 DIAGNOSIS — E1040 Type 1 diabetes mellitus with diabetic neuropathy, unspecified: Secondary | ICD-10-CM

## 2019-02-25 DIAGNOSIS — J40 Bronchitis, not specified as acute or chronic: Secondary | ICD-10-CM | POA: Diagnosis not present

## 2019-02-25 DIAGNOSIS — F1910 Other psychoactive substance abuse, uncomplicated: Secondary | ICD-10-CM

## 2019-02-25 DIAGNOSIS — L089 Local infection of the skin and subcutaneous tissue, unspecified: Secondary | ICD-10-CM | POA: Diagnosis not present

## 2019-02-25 DIAGNOSIS — Z720 Tobacco use: Secondary | ICD-10-CM

## 2019-02-25 DIAGNOSIS — R809 Proteinuria, unspecified: Secondary | ICD-10-CM

## 2019-02-25 DIAGNOSIS — F419 Anxiety disorder, unspecified: Secondary | ICD-10-CM | POA: Diagnosis not present

## 2019-02-25 LAB — POCT GLYCOSYLATED HEMOGLOBIN (HGB A1C): Hemoglobin A1C: 7.9 % — AB (ref 4.0–5.6)

## 2019-02-25 MED ORDER — PAROXETINE HCL 20 MG PO TABS: 20.0000 mg | ORAL_TABLET | Freq: Every day | ORAL | 0 refills | Status: AC

## 2019-02-25 MED ORDER — INSULIN ASPART 100 UNIT/ML ~~LOC~~ SOLN: SUBCUTANEOUS | 11 refills | Status: AC

## 2019-02-25 MED ORDER — BUDESONIDE-FORMOTEROL FUMARATE 160-4.5 MCG/ACT IN AERO: INHALATION_SPRAY | RESPIRATORY_TRACT | 1 refills | Status: AC

## 2019-02-25 MED ORDER — INSULIN GLARGINE 100 UNIT/ML ~~LOC~~ SOLN: 10.0000 [IU] | Freq: Every day | SUBCUTANEOUS | 0 refills | Status: AC

## 2019-02-25 MED ORDER — GABAPENTIN 400 MG PO CAPS: 800.0000 mg | ORAL_CAPSULE | Freq: Three times a day (TID) | ORAL | 0 refills | Status: AC

## 2019-02-25 MED ORDER — GLUCAGON EMERGENCY 1 MG IJ KIT: 1.0000 mg | PACK | Freq: Once | INTRAMUSCULAR | 5 refills | Status: AC | PRN

## 2019-02-25 MED ORDER — TRAZODONE HCL 50 MG PO TABS: 150.0000 mg | ORAL_TABLET | Freq: Every day | ORAL | 2 refills | Status: AC

## 2019-02-25 MED ORDER — ALBUTEROL SULFATE HFA 108 (90 BASE) MCG/ACT IN AERS: 2.0000 | INHALATION_SPRAY | Freq: Four times a day (QID) | RESPIRATORY_TRACT | 2 refills | Status: AC | PRN

## 2019-02-25 MED ORDER — DOXYCYCLINE HYCLATE 100 MG PO TABS: 100.0000 mg | ORAL_TABLET | Freq: Two times a day (BID) | ORAL | 0 refills | Status: AC

## 2019-02-25 NOTE — Progress Notes (Addendum)
Patient: Maurice Davidson Male    DOB: Dec 10, 1985   33 y.o.   MRN: [id number] Visit Date: 02/25/2019  Today's Provider: Trinna Post, PA-C   Chief Complaint  Patient presents with  . Rash   Subjective:     HPI   Patient is a 33 y/o male with past history of uncontrolled Type I diabetes, COPD, tobacco abuse, opioid and benzodiazepine abuse and frequent noncompliance presenting today with right hand swelling ongoing for four days that he attributes to a bug bite.   He reports he thinks something bit him in the night. He has severe pain in his right hand and cannot open or close his hand fully. The hand is red and warm. He reports he hasn't used drugs in 5 months. When asked about recent ER visits for heroin overdose, he says he actually hasn't used cocaine in 5 months. He denies injecting heroin and says he snorted it. Says he has used a needle to stick it into his hand where the bite is to drain the hand and that both his father and mother have watched him do this. Reports pus drained from the site.   Denies fevers, chills, vomiting. Reports he has nausea.   Has not been seen since 05/2018. He is frequently noncompliant with follow up and treatment plans. He has been dismissed from [facility] and there is a block in their system to prevent scheduling. He has also been dismissed from [facility] endocrinology due to multiple no shows. Says he really wants to get control of his diabetes.  Says he also wants to quit smoking.  Requesting refills of multiple psychiatric medications today. Says he would like Vraylar refilled, though unclear where he got this previously. Reports he was seen at [facility] in [place] and previously in [facility] [place] [facility].   Allergies  Allergen Reactions  . Aripiprazole Rash, Hives and Itching  . Benzodiazepines     DO NOT PRESCRIBE. Not true allergy.      Current Outpatient Medications:  .  acetaminophen (TYLENOL) 500 MG tablet, Take 500 mg by  mouth every 6 (six) hours as needed for mild pain. , Disp: , Rfl:  .  albuterol (VENTOLIN HFA) 108 (90 Base) MCG/ACT inhaler, Inhale 2 puffs into the lungs every 6 (six) hours as needed for wheezing or shortness of breath., Disp: 1 Inhaler, Rfl: 2 .  ALPRAZolam (XANAX) 1 MG tablet, Take 1 mg by mouth at bedtime as needed for anxiety., Disp: , Rfl:  .  budesonide-formoterol (SYMBICORT) 160-4.5 MCG/ACT inhaler, TAKE 2 PUFFS BY MOUTH TWICE A DAY, Disp: 10.2 g, Rfl: 1 .  cariprazine (VRAYLAR) capsule, Take 3 mg by mouth daily., Disp: , Rfl:  .  gabapentin (NEURONTIN) 400 MG capsule, Take 2 capsules (800 mg total) by mouth 3 (three) times daily., Disp: 90 capsule, Rfl: 0 .  glucagon (GLUCAGON EMERGENCY) 1 MG injection, Inject 1 mg into the muscle once as needed., Disp: 1 each, Rfl: 5 .  insulin aspart (NOVOLOG) 100 UNIT/ML injection, 2 units prior to meals plus sliding scale depending on sugar, Disp: 10 mL, Rfl: 11 .  insulin glargine (LANTUS) 100 UNIT/ML injection, Inject 0.1 mLs (10 Units total) into the skin at bedtime., Disp: 10 mL, Rfl: 0 .  Multiple Vitamins-Minerals (MULTIVITAMIN WITH MINERALS) tablet, Take 1 tablet by mouth daily., Disp: , Rfl:  .  pantoprazole (PROTONIX) 40 MG tablet, Take 1 tablet (40 mg total) by mouth daily., Disp: 30 tablet, Rfl: 3 .  PARoxetine (PAXIL) 20 MG tablet, Take 1 tablet (20 mg total) by mouth daily., Disp: 30 tablet, Rfl: 0 .  traZODone (DESYREL) 50 MG tablet, Take 3 tablets (150 mg total) by mouth at bedtime., Disp: 90 tablet, Rfl: 2 .  zolpidem (AMBIEN) 10 MG tablet, Take 10 mg by mouth at bedtime., Disp: , Rfl: 3 .  doxycycline (VIBRA-TABS) 100 MG tablet, Take 1 tablet (100 mg total) by mouth 2 (two) times daily for 7 days., Disp: 14 tablet, Rfl: 0  Review of Systems  Social History   Tobacco Use  . Smoking status: Current Some Day Smoker    Packs/day: 1.00    Types: Cigarettes  . Smokeless tobacco: Never Used  Substance Use Topics  . Alcohol use: No     Alcohol/week: 0.0 standard drinks      Objective:   BP 130/80 (BP Location: Left Arm, Patient Position: Sitting, Cuff Size: Normal)   Pulse 95   Temp 98.7 F (37.1 C) (Oral)   Resp 16   Ht 5\' 5"  (1.651 m)   Wt 107 lb (48.5 kg)   SpO2 96%   BMI 17.81 kg/m  Vitals:   02/25/19 1046  BP: 130/80  Pulse: 95  Resp: 16  Temp: 98.7 F (37.1 C)  TempSrc: Oral  SpO2: 96%  Weight: 107 lb (48.5 kg)  Height: 5\' 5"  (1.651 m)     Physical Exam Constitutional:      Appearance: He is underweight. He is ill-appearing.     Comments: Malnourished, chronically ill appearing. Very severe muscle wasting, cachetic appearing.   Cardiovascular:     Rate and Rhythm: Normal rate and regular rhythm.     Heart sounds: Normal heart sounds. No murmur.  Pulmonary:     Effort: Pulmonary effort is normal.     Breath sounds: Wheezing and rhonchi present.  Musculoskeletal:     Comments: There is gross swelling and erythema along the dorsum of his right hand extending proximally to his wrist. The hand is tender to palpation, warm, and slightly fluctuant. He cannot make a complete fist.   Skin:    General: Skin is warm and dry.     Coloration: Skin is pale.     Comments: Shallow depressed circular plaques on anterior arms.   Neurological:     Mental Status: He is alert.  Psychiatric:        Behavior: Behavior normal.    Media Information    Document Information  Photos  Right hand   02/25/2019 11:02  Attached To:  Office Visit on 02/25/19 with Trinna Post, PA-C  Source Information  Paulene Floor  [place]    1. Skin infection  I do not find it likely that he was bitten at night. He has multiple marks on his extremities and lesions consistent with skin popping. The lesion in question is not consistent with a bite. I suspect he has been injecting heroin and consequently has either an abscess, cellulitis, or tenosynovitis. Due to  patients chronic and severe comorbidities as well as the risk for infective endocarditis, I feel he will be best served in the ER with immediate labwork, imaging, echo, possible I&D or surgery for his hand. Patient adamantly refuses. I have counseled patient that this is very serious and it is possible he will die if he does not go to the ER and he reports he understands but he will not  go. I feel he is unaltered and has the ability to make this decision as well as understands the consequences of it. I have again counseled and he again refuses. I will treat as below and have strictly counseled that outpatient treatment will likely be inadequate.   His grandmother is present with him today and she has witnessed this conversation and is in agreement with the plan. She understands my advice and acknowledges that her grandson is going AMA.  Of note, according to the [facility] he had a 5 day  prescription for Hydrocodone-acetaminophen filled on 02/23/2019 from Homero Fellers, PA-C whom I know to be working at [employer]. Patient does not mention any such visit. I will contact that office and inquire about nature of visit and any recommendations that have been made.  I have placed a call to [facility] to see what could possibly be done on an emergent clinic basis. I am awaiting call back from physician.   I have spoken to [facility] Physician who reports that they would not be able to handle his issue in their clinic and he would be directed to the ER.   - DG Hand Complete Right; Future - CBC with Differential - Comprehensive Metabolic Panel (CMET) - Lipid Profile - TSH - doxycycline (VIBRA-TABS) 100 MG tablet; Take 1 tablet (100 mg total) by mouth 2 (two) times daily for 7 days.  Dispense: 14 tablet; Refill: 0 - DG Wrist Complete Right; Future  2. Bronchitis  - albuterol (VENTOLIN HFA) 108 (90 Base) MCG/ACT inhaler; Inhale 2 puffs into the lungs every 6 (six) hours as needed for wheezing or shortness  of breath.  Dispense: 1 Inhaler; Refill: 2  3. 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 He has been dismissed from [facility].   - glucagon (GLUCAGON EMERGENCY) 1 MG injection; Inject 1 mg into the muscle once as needed.  Dispense: 1 each; Refill: 5 - insulin aspart (NOVOLOG) 100 UNIT/ML injection; 2 units prior to meals plus sliding scale depending on sugar  Dispense: 10 mL; Refill: 11 - insulin glargine (LANTUS) 100 UNIT/ML injection; Inject 0.1 mLs (10 Units total) into the skin at bedtime.  Dispense: 10 mL; Refill: 0 - Ambulatory referral to Endocrinology - DG Hand Complete Right; Future  4. Anxiety  I will refill as below. He is asking for refills of vraylar which I do not know who prescribed this to him or who diagnosed him as bipolar. I have on numerous occasions attempted to get him into psychiatry without success. He again asks me for xanax and ambien, which I have again refused. He says this is on his medication list though it has not been filled at the pharmacy according to [place] review. I have counseled that I will never fill these medications for him.   Historically, he has been displeased with my refusal to fill these medications and had seen another provider in this office, who has since retired.   - PARoxetine (PAXIL) 20 MG tablet; Take 1 tablet (20 mg total) by mouth daily.  Dispense: 30 tablet; Refill: 0  5. Tobacco abuse  - budesonide-formoterol (SYMBICORT) 160-4.5 MCG/ACT inhaler; TAKE 2 PUFFS BY MOUTH TWICE A DAY  Dispense: 10.2 g; Refill: 1  6. Insomnia, unspecified type  - traZODone (DESYREL) 50 MG tablet; Take 3 tablets (150 mg total) by mouth at bedtime.  Dispense: 90 tablet; Refill: 2  7. 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  Dispense: 90  capsule; Refill: 0  8. Bipolar depression ([place])  - Ambulatory referral to Psychiatry  9. Drug abuse ([place])  - DG Hand Complete Right; Future - DG Wrist Complete Right; Future  The entirety of the information documented in the History of Present Illness, Review of Systems and Physical Exam were personally obtained by me. Portions of this information were initially documented by Lynford Humphrey, CMA and reviewed by me for thoroughness and accuracy.   I have spent 60 minutes with patient, >50% of which was spent on counseling and coordination of care.   F/u 6 months     Trinna Post, PA-C  [facility]

## 2019-02-25 NOTE — Patient Instructions (Signed)
I think you should go to the hospital. I am very concerned about the infection in your hand traveling to your heart as well as the fact that you have very severe Type I diabetes. I think by refusing to go to the hospital, you may die as a result of this. However, I do think you have decision making capacity and the decision not to go is yours. Again, I recommend you go to the hospital. Since you will not, I will attempt to treat you as an outpatient, however the options available to me may be insufficient and inadequate for your condition.    Endocarditis  Endocarditis is an infection of the inner layer of the heart (endocardium) or an infection of the heart valves. Endocarditis can cause growths inside the heart or on the heart valves. Over time, these growths can destroy heart tissue and cause heart failure or problems with heart rhythm. They can also cause stroke if they break away and form a blood clot in the brain. Early treatment offers the best chance for curing endocarditis and preventing complications. What are the causes? This condition may be caused by:  Germs that normally live in or on your body. The germs that most commonly cause endocarditis are bacteria.  A fungus. What increases the risk? This condition is more likely to develop in people who have:  A heart defect.  Artificial (prosthetic) heart valves.  An abnormal or damaged heart valve.  A history of endocarditis. Having certain procedures may also increase the risk of germs getting into the heart or bloodstream. What are the signs or symptoms? Symptoms of this condition may start suddenly, or they may start slowly and gradually get worse. Symptoms include:  Fever.  Chills.  Night sweats.  Muscle aches.  Fatigue.  Weakness.  Shortness of breath.  Chest pain.  Blood spots in the eyes.  Bleeding under the fingernails or toenails.  Painless red spots on the palms.  Painful lumps in the fingertips or  toes.  Swelling in the feet or ankles. How is this diagnosed? This condition may be diagnosed based on:  A physical exam. Your health care provider will listen to your heart to check for abnormal heart sounds (murmur). He or she may also use a scope to check for bleeding at the back of your eyes (retinas).  Tests. They may include: ? Blood tests to look for the germs that cause endocarditis. ? Imaging tests. A chest x-ray, CT scan, or echocardiogram may be used to create an image of your heart. A type of echocardiogram called a transesophageal echocardiogram may be done to look at certain heart valves more closely. How is this treated? Treatment for this condition depends on the cause of the endocarditis. Treatment may include:  Antibiotic medicines. These may be given through a tube into one of your veins (IV antibiotics) or taken by mouth. You may need to be on more than one antibiotic medicine.  Surgery to replace your heart valve. You may need surgery if: ? The endocarditis does not respond to treatment. ? You develop complications. ? Your heart valve is severely damaged. Follow these instructions at home: Medicines  Take over-the-counter and prescription medicines only as told by your health care provider.  If you were prescribed an antibiotic medicine, take it as told by your health care provider. Do not stop taking the antibiotic even if you start to feel better. You may need to be on intravenous antibiotics for several weeks.  Do  not use IV drugs unless it is part of your medical treatment. Lifestyle  Do not get tattoos or body piercings.  Practice good oral hygiene. This includes: ? Brushing and flossing regularly. ? Scheduling routine dental appointments.  Do not use any products that contain nicotine or tobacco, such as cigarettes and e-cigarettes. If you need help quitting, ask your health care provider.  Limit alcohol intake to no more than 1 drink a day for  nonpregnant women and 2 drinks a day for men. One drink equals 12 oz of beer, 5 oz of wine, or 1 oz of hard liquor. General instructions  Let your health care provider know before you have any dental or surgical procedures. You may need to take antibiotics before the procedure.  Tell all of your health care providers, including your dentist, that you have had endocarditis.  Gradually resume your usual activities.  Keep all follow-up visits as told by your health care provider. This is important. Contact a health care provider if:  You have a fever.  Your symptoms do not improve.  Your symptoms get worse.  Your symptoms come back. Get help right away if:  You have trouble breathing.  You have chest pain.  You have symptoms of a stroke. These include: ? Sudden weakness. ? Numbness. ? Confusion. ? Trouble talking or understanding. ? A severe headache. Summary  Endocarditis is an infection of the inner layer of the heart (endocardium) or heart valves. It is caused by bacteria or a fungus.  Having certain heart conditions or procedures may increase the risk of endocarditis.  Antibiotics are an important treatment for endocarditis. Take these medicines as told by your health care provider. Do not stop taking them even if you start to feel better.  Tell all of your health care providers, including your dentist, that you have had endocarditis. This information is not intended to replace advice given to you by your health care provider. Make sure you discuss any questions you have with your health care provider. Document Released: 09/02/2005 Document Revised: 06/14/2016 Document Reviewed: 06/14/2016 Elsevier Interactive Patient Education  2019 [employer].

## 2019-02-26 ENCOUNTER — Telehealth: Payer: Self-pay

## 2019-02-26 LAB — LIPID PANEL
Chol/HDL Ratio: 2.8 ratio (ref 0.0–5.0)
Cholesterol, Total: 93 mg/dL — ABNORMAL LOW (ref 100–199)
HDL: 33 mg/dL — ABNORMAL LOW (ref >39)
LDL Calculated: 32 mg/dL (ref 0–99)
Triglycerides: 140 mg/dL (ref 0–149)
VLDL Cholesterol Cal: 28 mg/dL (ref 5–40)

## 2019-02-26 LAB — COMPREHENSIVE METABOLIC PANEL
ALT: 21 IU/L (ref 0–44)
AST: 20 IU/L (ref 0–40)
Albumin/Globulin Ratio: 1.1 — ABNORMAL LOW (ref 1.2–2.2)
Albumin: 3.6 g/dL — ABNORMAL LOW (ref 4.0–5.0)
Alkaline Phosphatase: 263 IU/L — ABNORMAL HIGH (ref 39–117)
BUN/Creatinine Ratio: 25 — ABNORMAL HIGH (ref 9–20)
BUN: 29 mg/dL — ABNORMAL HIGH (ref 6–20)
Bilirubin Total: 0.3 mg/dL (ref 0.0–1.2)
CO2: 24 mmol/L (ref 20–29)
Calcium: 8.9 mg/dL (ref 8.7–10.2)
Chloride: 98 mmol/L (ref 96–106)
Creatinine, Ser: 1.16 mg/dL (ref 0.76–1.27)
GFR calc Af Amer: 95 mL/min/{1.73_m2} (ref >59)
GFR calc non Af Amer: 82 mL/min/{1.73_m2} (ref >59)
Globulin, Total: 3.3 g/dL (ref 1.5–4.5)
Glucose: 203 mg/dL — ABNORMAL HIGH (ref 65–99)
Potassium: 5.3 mmol/L — ABNORMAL HIGH (ref 3.5–5.2)
Sodium: 136 mmol/L (ref 134–144)
Total Protein: 6.9 g/dL (ref 6.0–8.5)

## 2019-02-26 LAB — CBC WITH DIFFERENTIAL/PLATELET
Basophils Absolute: 0.1 10*3/uL (ref 0.0–0.2)
Basos: 0 %
EOS (ABSOLUTE): 0.1 10*3/uL (ref 0.0–0.4)
Eos: 1 %
Hematocrit: 31.5 % — ABNORMAL LOW (ref 37.5–51.0)
Hemoglobin: 9.2 g/dL — ABNORMAL LOW (ref 13.0–17.7)
Immature Grans (Abs): 0.1 10*3/uL (ref 0.0–0.1)
Immature Granulocytes: 1 %
Lymphocytes Absolute: 1.6 10*3/uL (ref 0.7–3.1)
Lymphs: 9 %
MCH: 21.9 pg — ABNORMAL LOW (ref 26.6–33.0)
MCHC: 29.2 g/dL — ABNORMAL LOW (ref 31.5–35.7)
MCV: 75 fL — ABNORMAL LOW (ref 79–97)
Monocytes Absolute: 1.5 10*3/uL — ABNORMAL HIGH (ref 0.1–0.9)
Monocytes: 8 %
Neutrophils Absolute: 15.3 10*3/uL — ABNORMAL HIGH (ref 1.4–7.0)
Neutrophils: 81 %
Platelets: 524 10*3/uL — ABNORMAL HIGH (ref 150–450)
RBC: 4.21 x10E6/uL (ref 4.14–5.80)
RDW: 16.5 % — ABNORMAL HIGH (ref 11.6–15.4)
WBC: 18.7 10*3/uL — ABNORMAL HIGH (ref 3.4–10.8)

## 2019-02-26 LAB — TSH: TSH: 0.605 u[IU]/mL (ref 0.450–4.500)

## 2019-02-26 NOTE — Telephone Encounter (Signed)
-----   Message from Trinna Post, [place] sent at 02/26/2019  8:18 AM EDT ----- White count very hight, indicating bacterial infection. Like we talked about in office, he needs to go to the ER and the antibiotics I gave him likely won't work. If his hand is not improving, he will need to go to the ER.

## 2019-02-26 NOTE — Telephone Encounter (Signed)
NO answer and could not leave voice message due to patient not having one set up.

## 2019-03-02 NOTE — Telephone Encounter (Signed)
Patient is reportedly deceased. Not necessary to call back. Will await official report and/or death certificate.

## 2019-03-17 DEATH — deceased
